# Patient Record
Sex: Female | Born: 1955 | Race: White | Hispanic: No | Smoking: Former smoker
Health system: Southern US, Community
[De-identification: ages and names within clinical notes are randomized; demographics above are authoritative.]

## PROBLEM LIST (undated history)

## (undated) DIAGNOSIS — F32A Depression, unspecified: Secondary | ICD-10-CM

## (undated) DIAGNOSIS — C519 Malignant neoplasm of vulva, unspecified: Secondary | ICD-10-CM

## (undated) DIAGNOSIS — F329 Major depressive disorder, single episode, unspecified: Secondary | ICD-10-CM

## (undated) DIAGNOSIS — M1990 Unspecified osteoarthritis, unspecified site: Secondary | ICD-10-CM

## (undated) DIAGNOSIS — E119 Type 2 diabetes mellitus without complications: Secondary | ICD-10-CM

## (undated) DIAGNOSIS — L0390 Cellulitis, unspecified: Secondary | ICD-10-CM

## (undated) DIAGNOSIS — I10 Essential (primary) hypertension: Secondary | ICD-10-CM

## (undated) DIAGNOSIS — F419 Anxiety disorder, unspecified: Secondary | ICD-10-CM

## (undated) DIAGNOSIS — E7800 Pure hypercholesterolemia, unspecified: Secondary | ICD-10-CM

## (undated) DIAGNOSIS — E1142 Type 2 diabetes mellitus with diabetic polyneuropathy: Secondary | ICD-10-CM

## (undated) DIAGNOSIS — K219 Gastro-esophageal reflux disease without esophagitis: Secondary | ICD-10-CM

## (undated) DIAGNOSIS — R519 Headache, unspecified: Secondary | ICD-10-CM

## (undated) DIAGNOSIS — J302 Other seasonal allergic rhinitis: Secondary | ICD-10-CM

## (undated) HISTORY — PX: CARPAL TUNNEL RELEASE: SHX101

## (undated) HISTORY — PX: TUBAL LIGATION: SHX77

## (undated) HISTORY — PX: WISDOM TOOTH EXTRACTION: SHX21

## (undated) HISTORY — PX: AMPUTATION: SHX166

---

## 2005-08-09 DIAGNOSIS — C519 Malignant neoplasm of vulva, unspecified: Secondary | ICD-10-CM

## 2005-08-09 HISTORY — DX: Malignant neoplasm of vulva, unspecified: C51.9

## 2005-08-09 HISTORY — PX: LYMPHADENECTOMY: SHX15

## 2005-08-09 HISTORY — PX: VULVA SURGERY: SHX837

## 2007-03-10 HISTORY — PX: INCISION AND DRAINAGE OF WOUND: SHX1803

## 2007-03-24 ENCOUNTER — Ambulatory Visit: Payer: Self-pay | Admitting: Surgery

## 2007-03-24 ENCOUNTER — Inpatient Hospital Stay (HOSPITAL_COMMUNITY): Admission: EM | Admit: 2007-03-24 | Discharge: 2007-04-06 | Payer: Self-pay | Admitting: *Deleted

## 2007-03-27 ENCOUNTER — Ambulatory Visit: Payer: Self-pay | Admitting: Dentistry

## 2007-03-27 ENCOUNTER — Encounter: Payer: Self-pay | Admitting: Cardiothoracic Surgery

## 2007-03-30 ENCOUNTER — Ambulatory Visit: Payer: Self-pay | Admitting: Dentistry

## 2007-04-01 ENCOUNTER — Ambulatory Visit: Payer: Self-pay | Admitting: Infectious Diseases

## 2007-04-25 ENCOUNTER — Ambulatory Visit: Payer: Self-pay | Admitting: Surgery

## 2007-05-02 ENCOUNTER — Ambulatory Visit: Payer: Self-pay | Admitting: Surgery

## 2007-05-02 ENCOUNTER — Encounter: Admission: RE | Admit: 2007-05-02 | Discharge: 2007-05-02 | Payer: Self-pay | Admitting: Surgery

## 2007-06-08 ENCOUNTER — Ambulatory Visit: Payer: Self-pay | Admitting: Surgery

## 2007-06-12 ENCOUNTER — Ambulatory Visit: Payer: Self-pay | Admitting: Thoracic Surgery (Cardiothoracic Vascular Surgery)

## 2007-06-12 ENCOUNTER — Encounter: Admission: RE | Admit: 2007-06-12 | Discharge: 2007-06-12 | Payer: Self-pay | Admitting: Surgery

## 2007-06-14 ENCOUNTER — Ambulatory Visit: Payer: Self-pay | Admitting: Surgery

## 2007-06-16 ENCOUNTER — Ambulatory Visit: Payer: Self-pay | Admitting: Surgery

## 2007-06-16 ENCOUNTER — Inpatient Hospital Stay (HOSPITAL_COMMUNITY): Admission: RE | Admit: 2007-06-16 | Discharge: 2007-06-21 | Payer: Self-pay | Admitting: Surgery

## 2007-07-04 ENCOUNTER — Ambulatory Visit: Payer: Self-pay | Admitting: Surgery

## 2007-07-14 ENCOUNTER — Encounter: Admission: RE | Admit: 2007-07-14 | Discharge: 2007-07-14 | Payer: Self-pay | Admitting: Cardiothoracic Surgery

## 2007-07-14 ENCOUNTER — Ambulatory Visit: Payer: Self-pay | Admitting: Cardiothoracic Surgery

## 2007-07-28 ENCOUNTER — Encounter: Admission: RE | Admit: 2007-07-28 | Discharge: 2007-07-28 | Payer: Self-pay | Admitting: Cardiothoracic Surgery

## 2007-07-28 ENCOUNTER — Ambulatory Visit: Payer: Self-pay | Admitting: Cardiothoracic Surgery

## 2007-08-15 ENCOUNTER — Ambulatory Visit: Payer: Self-pay | Admitting: Surgery

## 2007-08-18 ENCOUNTER — Encounter (HOSPITAL_BASED_OUTPATIENT_CLINIC_OR_DEPARTMENT_OTHER): Admission: RE | Admit: 2007-08-18 | Discharge: 2007-09-12 | Payer: Self-pay | Admitting: Surgery

## 2007-09-05 ENCOUNTER — Ambulatory Visit: Payer: Self-pay | Admitting: Surgery

## 2008-04-05 IMAGING — CT CT NECK W/ CM
3 of 4 series · 15 of 33 positions shown, 18 images · IV contrast (75CC OMNI 300)
Comparison: CT neck of 03/27/07.
COMPARISON: CT chest, 03/27/07.

CLINICAL DATA: Sternoclavicular joint infection, evaluate mediastinitis. 
 NECK CT WITH CONTRAST:
TECHNIQUE: Multidetector CT imaging of the neck was performed following the standard protocol during administration of intravenous contrast.
 Contrast:  75 cc of Omnipaque 300.
TECHNIQUE: Multidetector CT imaging of the chest was performed following the standard protocol during bolus administration of intravenous contrast.

[Series 3: chest and neck · axial · 0.79mm/px · z∈[-203,+142]mm · 7 of 123 slices shown, 9 images]
[im 16/123  soft-tissue]
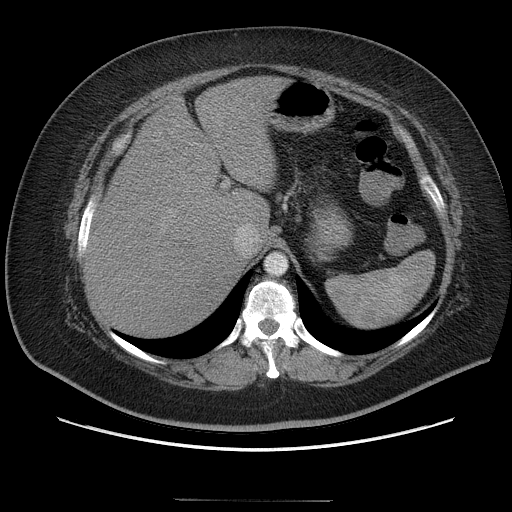
[im 16/123  bone]
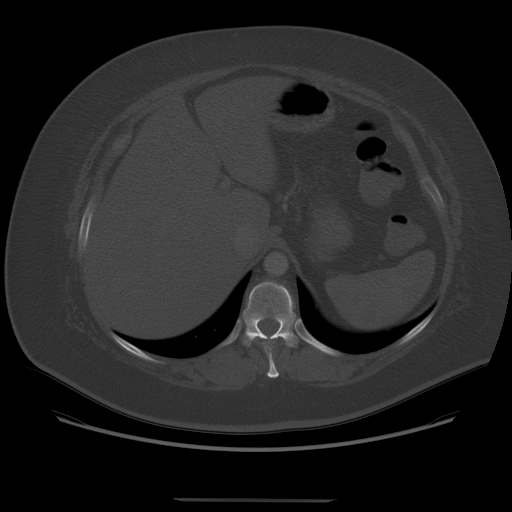
[im 31/123  bone]
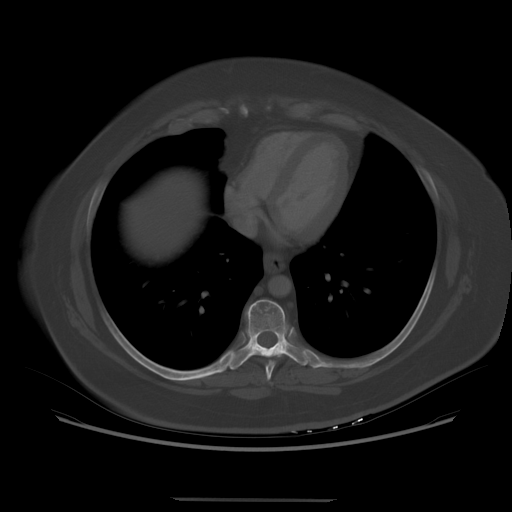
[im 46/123  bone]
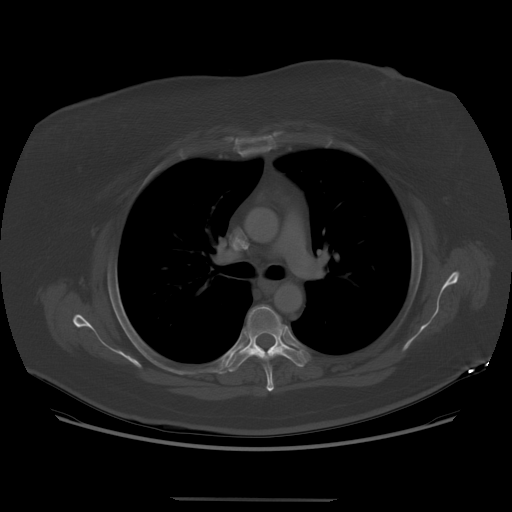
[im 62/123  bone]
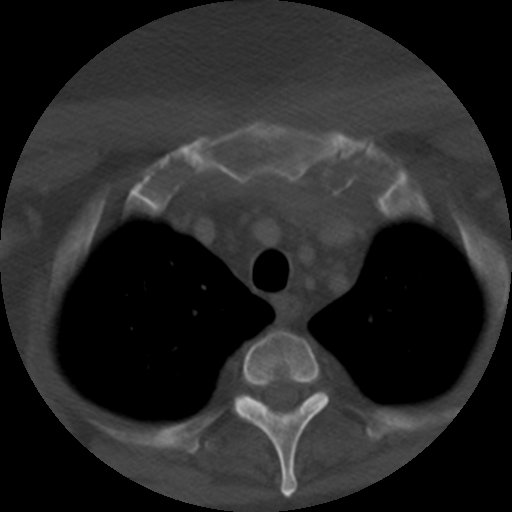
[im 77/123  soft-tissue]
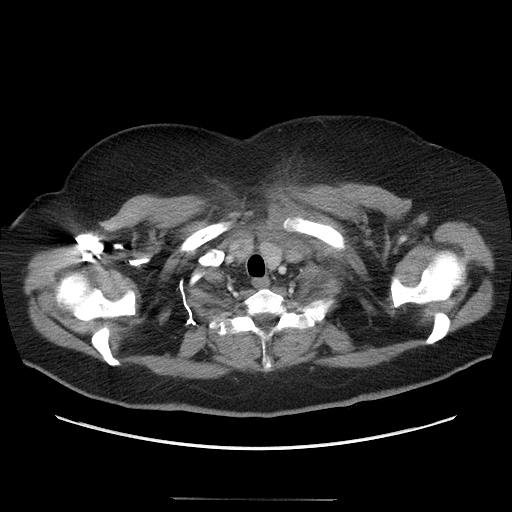
[im 77/123  bone]
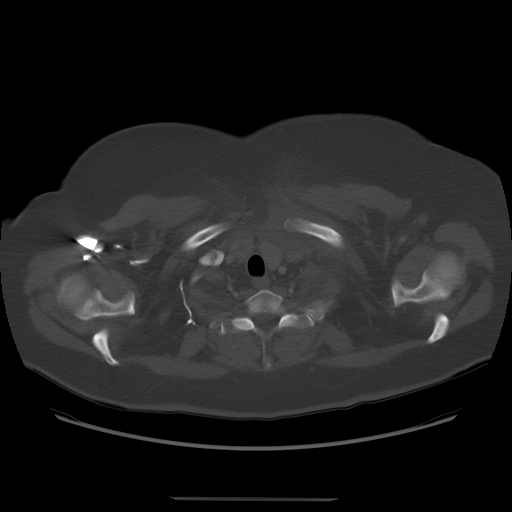
[im 92/123  bone]
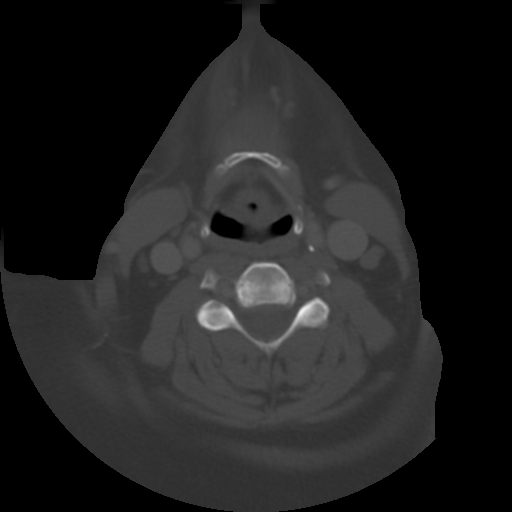
[im 107/123  bone]
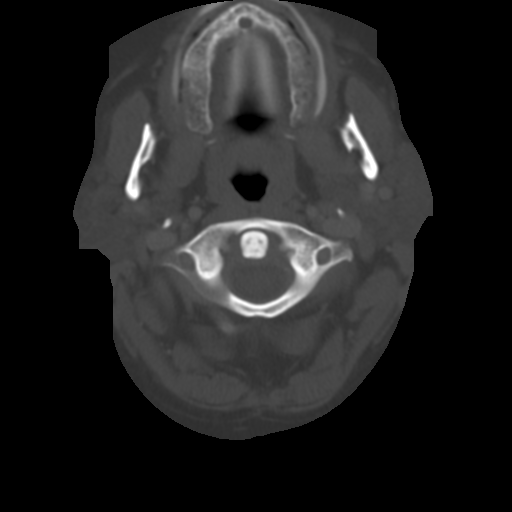

[Series 601: coronal body · coronal · 0.79mm/px · 5 of 136 slices shown, 6 images]
[im 46/136  bone]
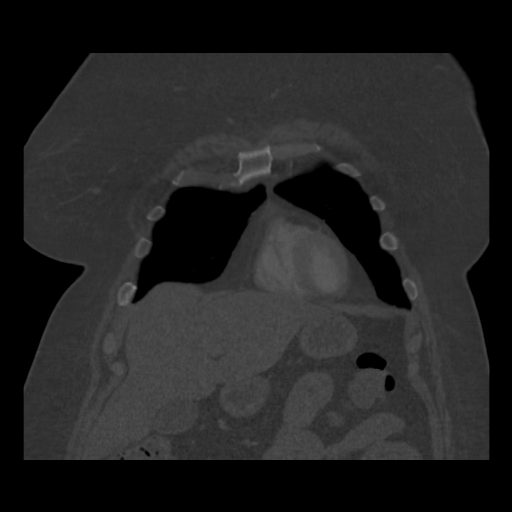
[im 57/136  bone]
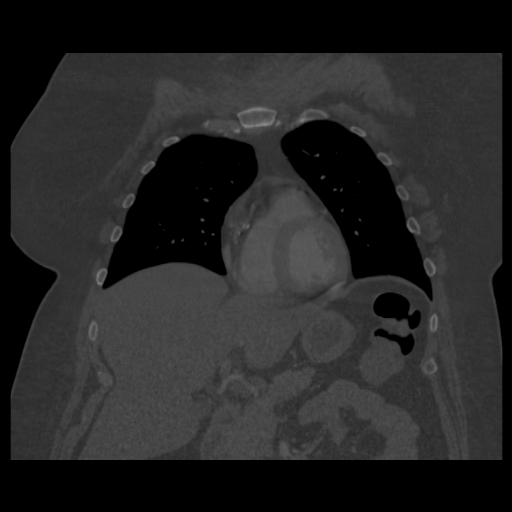
[im 68/136  soft-tissue]
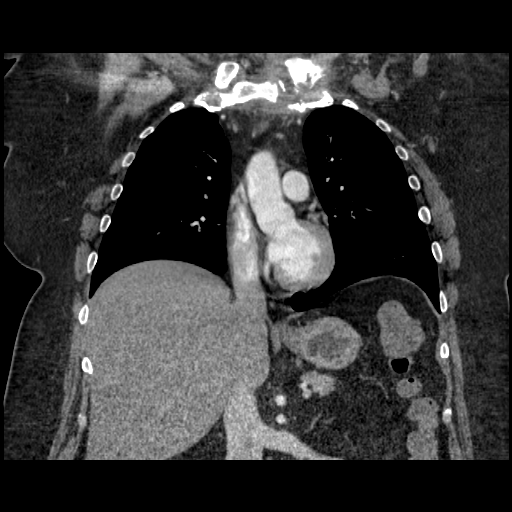
[im 68/136  bone]
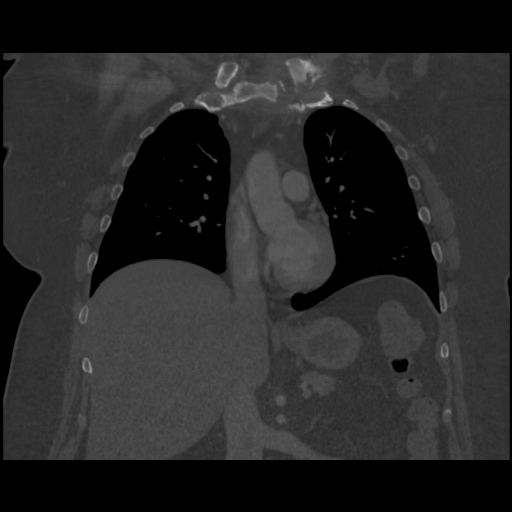
[im 79/136  bone]
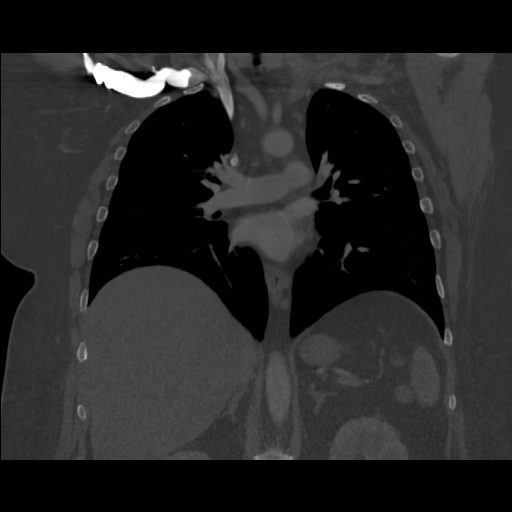
[im 91/136  bone]
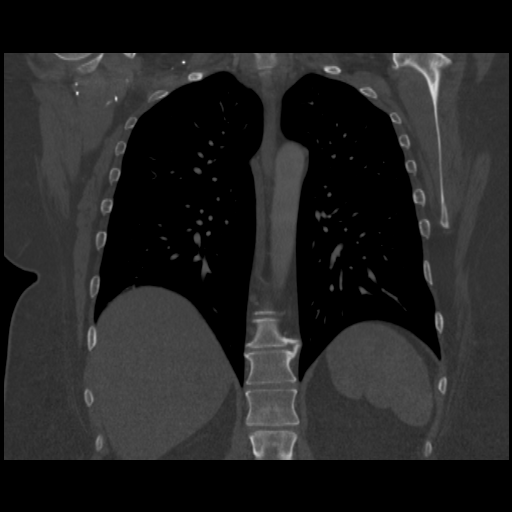

[Series 602: sagittal body · sagittal · 0.79mm/px · 3 of 162 slices shown]
[im 33/162  bone]
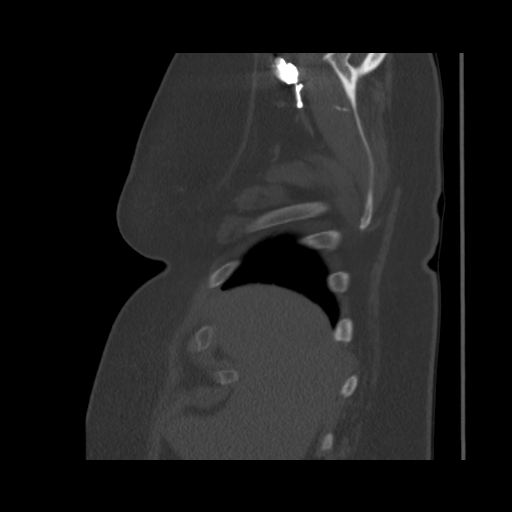
[im 65/162  bone]
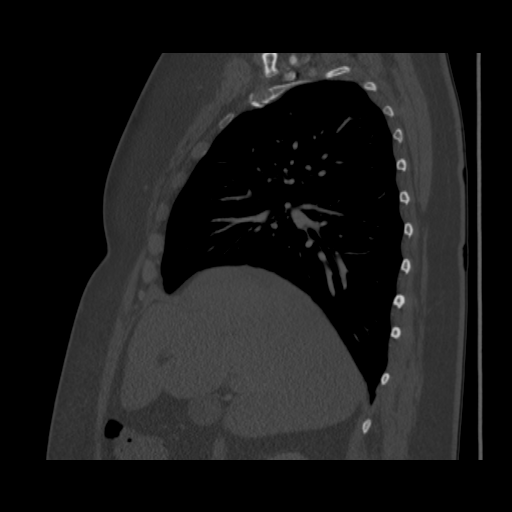
[im 97/162  bone]
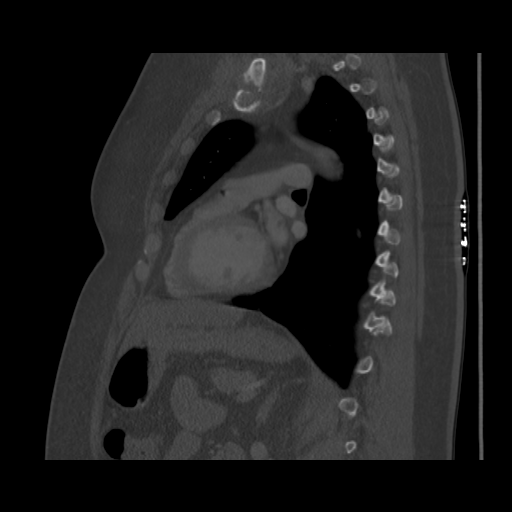

[15 of 33 positions shown; findings below may reference images not displayed]

FINDINGS: The base of the skull appears normal.  The paranasal sinuses are clear.   The parotid glands are symmetrical and normal.  The vallecula, preepiglottic fat space, piriform sinuses, level of the cords, and subglottic airway appear normal.  The edema/phlegmon involving the left sternocleidomastoid muscle has improved significantly.  There is soft tissue strandiness remaining on the anterolateral aspect of the left sternoclavicular muscle near the insertion on the head of the left clavicle which has improved.   There are cystic lesions within the distal aspect of the left clavicle and osteomyelitis is very difficult to exclude.  The previously noted mass effect caused by adjacent inflammatory reaction upon the left thyroid gland is no longer present.  The thyroid appears normal with only a single small low attenuation nodule in the right lobe inferiorly.  No abscess is seen and no adenopathy is noted.
IMPRESSION: 1.  Marked improvement in edema/phlegmon along the distal anterior left sternocleidomastoid muscle.
 2.  There is some irregularity of the mineralization of the left medial clavicle and osteomyelitis cannot be excluded.  No abscess is seen.
 CHEST CT WITH CONTRAST:
FINDINGS: There has been improvement in the edema/phlegmon adjacent to the left clavicular head and along the anterior aspect of the distal left sternocleidomastoid muscle as it inserts upon the sternum.  The strandiness within the anterior superior mediastinum has lessened as well.  No abscess is seen.  No mediastinal or hila adenopathy is noted.  On lung window images, no lung parenchymal abnormality is seen.
IMPRESSION: 1.  Improvement in edema/phlegmon surrounding the left medial clavicular head and adjacent left sternocleidomastoid muscle.  No abscess.
 2.  However, there are lucencies within the head of the left clavicle and osteomyelitis cannot be excluded.

## 2010-08-30 ENCOUNTER — Encounter: Payer: Self-pay | Admitting: Surgery

## 2010-12-22 NOTE — H&P (Signed)
HISTORY AND PHYSICAL EXAMINATION   June 14, 2007   Re:  Meghan [name], Meghan [name]        DOB:  1956/06/14   ADMITTING DIAGNOSIS:  Left supraclavicular joint infection with  osteomyelitis.   HISTORY OF PRESENT ILLNESS:  The patient is a 55 year old woman with a  history of diabetes who I initially saw on 03/24/2007 after she was  transferred to [facility] on the hospitalist service from [facility] for possible left sternoclavicular joint infection with  mediastinitis.  She had been treated at that time for recent  pyelonephritis with p.o. Cipro.  She continued to feel worse and  presented to [facility] with left neck pain.  She was  treated with an antiinflammatory and discharged.  She returned to her  primary physician, Dr. Sherril Croon, on March 23, 2007, and was noted to have  leukocytosis but no fever.  She had a CT scan of the neck that showed  possible left sternoclavicular joint infection with extension in the  mediastinum.  She was seen by an ENT physician at [facility] and  transfer to [facility] was recommended.  Review of her CT scan at that  time did show an inflammatory or infectious process around the left  sternoclavicular joint, as well as the left supraclavicular and left  lower neck area.  There appeared to be some extension below the clavicle  on the left side.  There was no definite abscess or defined fluid  collection.  This was more of a stranding or a thickening of the  subcutaneous plane and deep planes.  There was no evidence of bony  destruction at that time.Marland Kitchen  She was started on broad-spectrum  antibiotics including vancomycin.  Her symptoms began to improve.  Cultures of her blood were taken but there were no bacteria grown.  She  was seen for dental consultation due to multiple bad teeth and underwent  extraction of multiple teeth by oral surgery without complication.  She  underwent a CT scan a few days later on  August 18th which showed no  significant change in the inflammatory infectious process and no  evidence of osteomyelitis or abscess.  She was continued on intravenous  antibiotic with progressive improvement in the induration and erythema  around the left neck and upper chest.  She did sustain a left upper  extremity deep venous thrombosis of unclear etiology and was started on  heparin and then converted to Coumadin.  She was eventually discharged  home on oral antibiotics.  She underwent a repeat CT scan on 05/02/2007  that showed improvement in the edema and phlegmon adjacent to the left  clavicular head and along the anterior aspect of the distal left  sternocleidomastoid muscle.  The stranding in the anterior superior  mediastinum had lessened.  There was no evidence of abscess.  There was  new lucencies in the head of the left clavicle suggesting osteomyelitis.  Since discontinuing antibiotics, she has continued to have pain in her  left sternoclavicular joint area, as well as in the left shoulder.  She  has had pain whenever she uses her left arm and has difficulty lifting  her arm up above 90 degrees of abduction.  She has denied any fever or  chills.  I have seen her 3 times since her discharge.  When I last saw  her, she had some mild patchy erythema along the left lower neck and  around the left sternoclavicular joint area.  I thought it would be best  to repeat her CT scan since it had been about 1 month.  She had the scan  repeated on Monday and this shows continued inflammatory changes in the  left lower neck, as well as in the left sternoclavicular region.  This  does extend across the midline over to the area of the right  sternoclavicular joint.  The bony changes in the left clavicular head  are unchanged.  There is no evidence of bony destruction of the right  clavicle.  There are no abscesses or fluid collections.  She said that  since this past weekend she has noted  some pain over her right  sternoclavicular joint, as well as pain in her right shoulder and right  neck.  She has never had this before.  She has pain with movement of her  right arm.   REVIEW OF SYSTEMS:  GENERAL:  She denies any fever or chills.  She  denies recent weight changes.  She has had fatigue over the past couple  months.  EYES:  Negative.  ENT:  She denies any pain in her teeth or jaw at this time.  ENDOCRINE:  She has type 2 diabetes.  She denies hypothyroidism.  CARDIOVASCULAR:  She denies any chest pain or pressure.  She has had no  exertional dyspnea.  She has had no PND or orthopnea.  She denies  peripheral edema and palpitations.  RESPIRATORY:  She denies cough and sputum production.  GI:  She denies nausea and vomiting.  She has had some anorexia over the  past couple weeks.  She denies any melena or bright red blood per  rectum.  She denies dysphagia.  GU:  She denies dysuria and hematuria.  NEUROLOGICAL:  She denies any focal weakness or numbness.  She denies  dizziness and syncope.  She has never had a TIA or stroke.  ALLERGIES:  NONE.  PSYCHIATRIC:  Negative.  HEMATOLOGICAL:  Negative.   PAST MEDICAL HISTORY:  1. Significant for type 2 diabetes.  2. She has a history of hypertension and hypercholesterolemia.  3. She has a history of vulvar precancer, having had surgery done      twice.  4. 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   FAMILY HISTORY:  She was adopted and her family history is unknown.   SOCIAL HISTORY:  She works as a Pharmacologist.  She does not smoke  and denies alcohol abuse.   PHYSICAL EXAMINATION:  Temperature is 99.4.  Blood pressure 124/79.  Pulse 104 and regular.  Respiratory rate is 18, nonlabored.  She is a  well-developed, mildly obese, white female in no distress.  HEENT:  Exam shows to be normocephalic and atraumatic.  Pupils are equal  and reactive to light and accommodation.   Extraocular muscles are  intact.  Her throat is clear.  NECK:  Exam shows normal carotid pulses bilaterally.  There are no  bruits.  There is no thyromegaly.  There is no adenopathy.  There is  mild tenderness and erythema along the left lower neck, as well as in  the area over the left sternoclavicular joint and right sternoclavicular  joint.  There is no fluctuance or induration.  CARDIAC:  Exam shows a regular rate and rhythm and normal S1 and S2.  There is no murmur, rub, or gallop.  LUNGS:  Clear.  ABDOMEN:  Exam shows active bowel sounds.  Her abdomen was soft and  nontender.  There are no palpable masses or organomegaly.  EXTREMITIES:  Exam shows no peripheral edema.  Pedal pulses are palpable  bilaterally.  SKIN:  Warm and dry.  NEUROLOGIC:  Exam shows her to be alert and oriented x3.  Motor and  sensory exams are grossly normal.   IMPRESSION:  The patient has evidence of persistent inflammation and  infection involving the left sternoclavicular joint with some  destruction of the head of the left clavicle suggesting osteomyelitis.  She has developed progression of symptoms to the right side around the  right sternoclavicular joint with pain in the right shoulder and right  neck.  I think she has had an adequate course of intravenous followed by  oral antibiotics and the best treatment at this time is incision and  debridement of the left sternoclavicular joint.  This may require  debridement of the head of the left clavicle, as well as first costal  cartilage if involved.  I would plan to explore the right  sternoclavicular region if necessary.  I would plan to leave the wound  open and probably use a VAC suction system for dressing changes.  I  discussed the operative procedure with her and her husband including the  alternative of continued antibiotics which I do not recommend.  I  discussed the benefits and risks of surgery including but not limited to  bleeding, blood  transfusion, persistent infection, injury to major  vascular or nervous structures in the neck or chest, scarring, and need  for further procedures.  I also discussed removal of the head of the  clavicle on one or both sides, as well as costal cartilage and possibly  some of the sternum and the  possibility of some disability due to that.  She understands and would  like to proceed with surgery.  We will plan to do this on Friday,  06/16/2007.   Evelene Croon, M.D.  Electronically Signed   BB/MEDQ  D:  06/14/2007  T:  06/15/2007  Job:  [id number]   cc:   Evelene Croon, M.D.  Dhruv Sherril Croon

## 2010-12-22 NOTE — Assessment & Plan Note (Signed)
OFFICE VISIT   Meghan [name], Meghan [name]  DOB:  06/25/1956                                        July 28, 2007  CHART #:  [id number]   CURRENT PROBLEMS:  1. Status post debridment of left sternoclavicular joint infection for      osteomyelitis November 2008.  2. Diabetes mellitus.  3. Hypertension.   HISTORY OF PRESENT ILLNESS:  The patient returns for a wound check of  her left upper chest.  This is now secondarily granulating in and is  being treated with saline wet-to-dry.  It is very superficial and  involves really only the dermis at this point.  The chest wall is  nontender and stable, and her chest x-ray continues to show improvement.   Her more concerning problem now involves her right foot which has had  osteomyelitis in the past and for which she has required toe  amputations.  At this point, her right third toe has a necrotic end with  a sinus tract extending down towards the digital bone.  This was cleaned  with peroxide and saline and a small wick was applied.  She has been  started on oral Cipro by her primary care physician and an apparent MRI  had been performed which shows no osteomyelitis.   PHYSICAL EXAMINATION:  VITAL SIGNS: Blood pressure 100/80, pulse 90 and  regular, respirations 18, saturation 98% on room air and she is  afebrile.  LUNGS:  Her breath sounds are clear.  HEART:  Her cardiac rhythm is regular.  The chest wall incision over the  left sternoclavicular junction is granulating in and is almost  completely healed to the derma.  RIGHT TOE:  The right toe is concerning and it was cleaned and packed as  noted above.   PLAN:  The patient will continue local wound care for her chest wall  incision.  I recommended that she obtain evaluation and possible therapy  for her foot problem at the wound center here in [place] and our  office will make the referral.  She will continue the antibiotics as  prescribed by her  primary doctor, Dr. Reggie Pile, and return here to see Dr.  Laneta Simmers after the first of the year.  No prescription were provided this  office visit.   Kerin Perna, M.D.  Electronically Signed   PV/MEDQ  D:  07/28/2007  T:  07/30/2007  Job:  [id number]

## 2010-12-22 NOTE — H&P (Signed)
NAME:  Meghan [name], Meghan [name] NO.:  [id number]   MEDICAL RECORD NO.:  [id number]          PATIENT TYPE:  INP   LOCATION:  2312                         FACILITY:  [facility]   PHYSICIAN:  Hettie Holstein, D.O.    DATE OF BIRTH:  15-Apr-1956   DATE OF ADMISSION:  03/24/2007  DATE OF DISCHARGE:                              HISTORY & PHYSICAL   PRIMARY CARE PHYSICIAN:  Doreen Beam, MD, in [place].   CHIEF COMPLAINT:  Fevers and neck pain.   HISTORY OF PRESENT ILLNESS:  Meghan [name] is a very pleasant 55-year-  old female who was contacted by Dr. Sherril Croon in request of transfer to [facility] for the radiographic diagnosis of mediastinitis and pyelonephritis.  Meghan [name] presented to the emergency department with pain in her  neck on Friday and Saturday this past week, predominantly involving her  left.  This was followed by nausea and vomiting with flank pain on  Sunday.  She was seen by Dr. Sherril Croon on Monday and ultimately seen in the  emergency room on Tuesday after being initiated on antibiotics for  urinary tract infection with Cipro.  She was given some IV fluids, had  some blood work drawn, urinalysis and sent home with Voltaren Wednesday.  This pain got worse, to the point she stated she was unable to even  dress.  She had high fevers of 102.6.  Ultimately was admitted by Dr.  Sherril Croon on Thursday, underwent ENT consultation, and CT scanning of her  neck revealed the findings concerning for mediastinitis.   I discussed this case with Dr. Laneta Simmers prior to her arrival here, and  ultimately, I discussed the CT imaging with Dr. Jena Gauss of radiology on  call over the evening.  He, in addition, was concerned that this was  anterior mediastinitis, perhaps resulting from a septic [place] joint or it  could possibly be the result of seeding from her pyelonephritis.  No  culture data is available at this time.  She is sent on Unasyn.  We are  initiating Zosyn and vancomycin after obtaining a  set of blood cultures.  At this juncture, Dr. Laneta Simmers is going to see her soon and perhaps  perform drainage.  No apparent abscesses at this time, but there is  certainly a possible development.   MEDICAL HISTORY:  Significant for vulvar surgery in 2007, which she  states was precancerous lesions.  This was followed by lymph node  dissection in her groin in 2008, where she reports these were negative.  She has had a toe amputation in June, as she has insulin-requiring  diabetes.  She has undergone laparoscopic bilateral tubal ligation.  She  has history of hypertension and hypercholesterolemia.   MEDICATIONS:  The medications which she takes regularly include:  1. Lantus 73 units q.a.m.  2. Metformin 1000 mg b.i.d.  3. Folic acid 1 mg daily.  4. Enalapril 5 mg p.o. b.i.d.  5. Zocor 40 mg daily.   ALLERGIES:  SHE HAS NO KNOWN DRUG ALLERGIES.   SOCIAL HISTORY:  She is a Associate Professor at [employer].  She does not drink  alcohol and does  not smoke.  She is married, and she does have 1 child.   FAMILY HISTORY:  She states that she does not know her father and is  unaware of her mother's family history either.  It is noted that in Dr.  Sherril Croon' H&P that she is adopted.   REVIEW OF SYSTEMS:  Nausea and vomiting as described above on Sunday.  No bloody emesis, no hematochezia, no dysuria.  She does have flank pain  as described above and high fevers.   PHYSICAL EXAMINATION:  VITAL SIGNS:  Stable.  She was slightly  tachycardic.  Her temperature upon arrival was 98.4, heart rate 113,  blood pressure 141/53, respirations 14, oxygen is 100% on 2 liters.  HEENT:  Her head is normocephalic, atraumatic.  Extraocular muscles are  intact.  Her oropharynx reveals oral thrush.  NECK:  Swollen and tender, more specifically left greater than right.  Her left sternoclavicular joint is erythematous and warm and tender to  touch.  She has exquisite pain with movement of left arm proximally.   CARDIOVASCULAR EXAM:  There is normal S1 and S2, though tachycardic.  LUNGS:  She exhibits normal effort, and breath sounds are clear  bilaterally.  ABDOMEN:  Soft, mildly obese, nontender.  No rebound or guarding.  LOWER EXTREMITIES:  Reveal no edema.  Peripheral pulses symmetrical and  palpable.  NEUROLOGIC EXAM:  She is alert and oriented.  Affect is stable.   LABORATORY DATA:  Obtained from [facility], her white count was  21.5, hemoglobin 11.8, platelet count 348, her sed rate was 80, INR was  1.1.  Sodium 134, potassium 4.1, BUN 7, creatinine 0.6, glucose 271, an  albumin of 2.4.  Her urinalysis revealed a few uric acid crystals.   ASSESSMENT:  1. Mediastinitis.  2. Possible septic sternoclavicular joint on the left.  3. Pyonephritis.  We are currently awaiting culture data.  4. Febrile illness and leukocytosis.  5. Diabetes mellitus.  6. Thrush.  7. Hypertension.  8. History of vulvar cancer.   PLAN:  At this time, Meghan [name] is being held n.p.o. until she is  postop per Dr. Laneta Simmers, who will see her in the morning.  We will  continue empiric antibiotics and obtain blood cultures.  We will follow  her clinical course, decrease her Lantus, and place her on a D5 drip  while she is n.p.o. and await further recommendations from Dr. Laneta Simmers.      Hettie Holstein, D.O.  Electronically Signed     ESS/MEDQ  D:  03/24/2007  T:  03/24/2007  Job:  [id number]

## 2010-12-22 NOTE — Assessment & Plan Note (Signed)
OFFICE VISIT   Meghan [name], Meghan [name]  DOB:  06-21-1956                                        July 04, 2007  CHART #:  [id number]   HISTORY OF PRESENT ILLNESS:  Meghan [name] returned to the office today  for followup status post incision and drainage and debridement of her  left sternoclavicular joint on June 16, 2007. She had insertion of a  vacuum sponge drain and was discharged home with home health nursing  changing the sponge. She did not have any pus in the joint but the joint  was essentially destroyed. The cultures did not grow any bacteria. She  was treated with a course of Augmentin for 2 weeks postoperatively. She  said she has been feeling relatively well overall. She denies any fever  or chills. She has had no pain in her left shoulder or upper extremity.  She has had some intermittent pains in her right shoulder but said this  has been better than it was before. She has had some anorexia, which she  relates being due to the Augmentin.   PHYSICAL EXAMINATION:  VITAL SIGNS:  On examination today blood pressure  is 111/68, pulse 84 and regular. Respiratory rate is 18 and unlabored.  Oxygen saturation on room air is 96%.  GENERAL:  She looks well.  CARDIOVASCULAR:  The vac sponge was removed and the wound is closing in  nicely. It is fairly superficial and granulating well. There is no  erythema or induration. There is no swelling or induration over the  right sternoclavicular joint.   IMPRESSION:  Meghan [name] appears to be making a good recovery  following incision and debridement of her left sternoclavicular joint  for suspected septic arthritis. This appears to have resolved her  symptoms on that side. The wound is healing in nicely and we will switch  her to wet to dry dressings until completely healed. The etiology of  this is still unclear, since no bacteria grew from that. She will  complete her current antibiotic course  and then we will continue  following her to see if she develops any recurrent signs of inflammation  or infection, after discontinuation of the antibiotics. I am still a  little bit concerned about her right sternoclavicular joint but we will  need to follow this to see what the natural progression of it is. I  will plan to have her return to the office in about 2 weeks, to see Dr.  Donata Clay for followup and I will see her when I return to [facility] in  January.   Evelene Croon, M.D.  Electronically Signed   BB/MEDQ  D:  07/04/2007  T:  07/04/2007  Job:  [id number]

## 2010-12-22 NOTE — Discharge Summary (Signed)
NAME:  Meghan [name], Meghan [name] NO.:  [id number]   MEDICAL RECORD NO.:  [id number]          PATIENT TYPE:  INP   LOCATION:  6730                         FACILITY:  [facility]   PHYSICIAN:  Herbie Saxon, MDDATE OF BIRTH:  [birth year]   DATE OF ADMISSION:  03/24/2007  DATE OF DISCHARGE:  04/06/2007                         DISCHARGE SUMMARY - REFERRING   ADDENDUM:   ADDITIONAL DISCHARGE DIAGNOSIS:  Chronic periodontitis, retained roots.   PROCEDURE:  Alveoloplasty and multiple tooth extraction on March 30, 2007.   CONSULTATIONS:  1. Charlynne Pander, D.D.S., dental.  2. Lacretia Leigh. Ninetta Lights, M.D., infectious disease.   A left venous Doppler confirmed DVT in the left brachial vein.   CLINICAL CONDITION:  stable.  Patient is tolerating oral food.  She has  intermittent bleeding from the gums but this is inactive at present.   DIET:  Cardiac, 1800 calorie, low cholesterol.   ACTIVITY:  As tolerated.   FOLLOW UP:  1. Primary care physician, Dr. Jearld Fenton, in five to seven days.  2. Dr. Laneta Simmers in three to four weeks.  3. Dr. Kristin Bruins, dentist, in two to three weeks.   DISCHARGE MEDICATIONS:  1. Augmentin 875 mg p.o. b.i.d. for four weeks.  2. Doxycycline 100 mg b.i.d. for four weeks.  3. Chlorhexidine 10 mL t.i.d.  4. Lantus insulin 65 units subcu nightly.  5. Lisinopril 10 mg daily.  6. Metoprolol 12.5 mg b.i.d.  7. Multivitamin one tablet daily.  8. Metformin 500 mg p.o. daily.  9. Coumadin 3 mg nightly.  Primary care is to monitor PT/INR at least      twice weekly the first two weeks, then subsequently weekly.      Patient has been educated to watch for bleeding.   PHYSICAL EXAMINATION:  VITAL SIGNS:  Temperature 98, pulse 72,  respiratory rate 18, blood pressure 110/65.  GENERAL APPEARANCE:  She is a middle-age lady in no acute distress.  HEENT:  Pupils are equal, round, reactive to light and accommodation.  Oropharynx  clear, no active gum bleeding.  NECK:  Supple.  CHEST:  Clinically clear.  CARDIOVASCULAR:  Heart sounds 1 and 2, regular rate and rhythm.  ABDOMEN:  Benign.  EXTREMITIES:  Peripheral pulses present, no pedal edema.  Left arm  swelling is much reduced.  There is no erythema.  NEUROLOGIC:  She is alert and oriented x3.   LABORATORY DATA:  The wbc is 9.6, hematocrit 32, platelet count 635.  INR 2.9, PT 31.4.  Chemistry shows sodium 138, potassium 3.8, chloride  105, bicarbonate 27, glucose 138.  BUN 10, creatinine 0.55.      Herbie Saxon, MD  Electronically Signed     MIO/MEDQ  D:  04/06/2007  T:  04/06/2007  Job:  [id number]

## 2010-12-22 NOTE — Consult Note (Signed)
NAME:  HOLY, BATTENFIELD NO.:  [id number]   MEDICAL RECORD NO.:  [id number]          PATIENT TYPE:  INP   LOCATION:  2312                         FACILITY:  [facility]   PHYSICIAN:  Evelene Croon, M.D.     DATE OF BIRTH:  February 14, 1956   DATE OF CONSULTATION:  DATE OF DISCHARGE:                                 CONSULTATION   REFERRING PHYSICIAN:  Dr. Angelena Sole.   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   HISTORY OF PRESENT ILLNESS:  I was asked by Dr. Angelena Sole to evaluate this  patient for possible sternoclavicular joint infection with  mediastinitis, who was transferred here from [facility]  overnight.  She was a 55 year old obese diabetic woman, who was treated  for possible pyelonephritis with p.o. Cipro recently.  She continued to  feel worse and presented to [facility] emergency room with neck  pain.  She was treated with diclofenac and discharged.  The patient said  that she could not take that medicine and was seen in the office of Dr.  Sherril Croon on March 23, 2007.  She was noted to have leukocytosis.  She was  also noted be febrile.  She had a CT scan of the neck, which was felt by  radiology and showed possible infection of the sternoclavicular joint  area, with extension into the mediastinum suggesting mediastinitis.  She  was apparently seen by an ear, nose and throat physician at [facility],  and it was felt that transfer to a tertiary care center was the best  option for treatment.  She was, therefore, transferred to [facility]  overnight.   REVIEW OF SYSTEMS:  Is as follows:  GENERAL:  She reports having fever  and chills.  She has had no recent weight changes.  She has had fatigue.  EYES:  Negative.  ENT:  She denies any pain in her teeth or jaw.  ENDOCRINE:  She has type 2 diabetes.  She denies hypothyroidism.  CARDIOVASCULAR:  She denies any chest pain or pressure.  She has had no  exertional  dyspnea.  She denies PND or orthopnea.  Had had no peripheral  edema or palpitations.  RESPIRATORY:  She denies cough and sputum  production.  GI:  She denies nausea and vomiting.  She does have some  mild discomfort with swallowing.  She denies any melena or bright red  blood per rectum.  GU:  She denies dysuria and hematuria.  She has had  some mild flank pain.  NEUROLOGICAL:  She denies any focal weakness or  numbness.  She has a lot of pain in her left arm.   ALLERGIES:  NONE.   PSYCHIATRIC:  Negative.   HEMATOLOGICAL:  Negative.   PAST MEDICAL HISTORY:  Significant for type 2 diabetes.  She has a  history of hypertension and hypercholesterolemia.  She has a history of  vulvar pre-cancer, having had surgery done twice.  She has history of a  right second toe amputation, secondary to diabetic complication.   SOCIAL HISTORY:  She works as a Pharmacologist.  She does not smoke  and denies  alcohol abuse.   FAMILY HISTORY:  Is unknown.  The patient was adopted.   PHYSICAL EXAMINATION:  VITAL SIGNS:  She is afebrile.  Blood pressure  was 145/70, and her pulse is 110 and regular.  Respiratory rate is 18  and unlabored.  GENERAL:  She is an obese, white female in no distress.  HEENT:  Exam shows to be normocephalic and atraumatic.  Pupils are  equal, reactive to light accommodation.  Extraocular muscles are intact.  Her throat is clear.  NECK:  Exam shows normal carotid pulses bilaterally.  There are bruits.  There is no thyromegaly.  The left supraclavicular area and left neck  area is mildly swollen and somewhat tender and erythematous and  indurated.  SKIN:  Intact.  CARDIAC:  Exam shows a regular rate and rhythm with normal S1 and S2.  There is no murmur, rub or gallop.  LUNGS:  Clear.  ABDOMEN:  Exam shows active bowel sounds.  Abdomen soft, obese and  nontender.  There are no palpable masses or organomegaly.  EXTREMITY:  Exam shows no peripheral edema.  NEUROLOGIC:   Exam shows to be alert and oriented x3.  Motor and sensory  exams grossly normal.   LABORATORY:  Her white blood cell count of at [facility] was  21.6, with hemoglobin 11.8, platelet count 340,000 with a left shift.  Sed rate was elevated at 80.  Her coagulation profile is within normal  limits.  Urinalysis showed dysuria, 0 to 5 white cells with negative  leukocyte esterase and negative nitrates.  Her albumin was 2.4 with  normal liver function profile and normal electrolytes.  BUN was 7,  creatinine was 0.6.  Laboratory exam here showed a white blood cell  count of 22,000.   IMPRESSION:  I reviewed the neck CT from [facility] with  radiology and Dr. Tyrone Sage and Dr. Edwyna Shell.  The patient has an  inflammatory or infectious process in the left supraclavicular and left  lower neck area.  There appears to be some extension of this below the  left clavicle, around the left sternoclavicular joint, and possibly into  the upper mediastinum.  There is no abscess or defined fluid collection.  This was more of a stranding and thickening in the subcutaneous plane  and deep planes.  There is no evidence of bony destruction or definite  infection in the left sternoclavicular joint.  The CT scan only goes  down into the upper mediastinum, at the level of the sterno-manubrial  junction.  The etiology of this is unclear.  The patient certainly could  have developed some bacteremia and seated her sternoclavicular joint,  causing infection and surrounding inflammation.  This could also  represent a non-infectious cause, such as gallops.  It is also possible  there could be an unusual tumor in this area, causing these findings.  At this point in time, I do not think there is any definite fluid  collection or abscess to drain.  If we try to drain this area now, we  will likely end up with a large open wound, with some murky-appearing  subcutaneous fluid.  My colleagues do not feel that the  best initial  treatment would be broad-spectrum intravenous antibiotics. including  cover for MRSA and a close followup, to see the patient improves.  She  should have a follow-up CT scan in 4 to 5 days, to see if there has been  progressive improvement with antibiotics.  If this does not resolve  or  she develops a well-defined abscess, then that would require a surgical  drainage.  I discussed this plan with her and her family, and they are  in agreement to proceed in this direction.      Evelene Croon, M.D.  Electronically Signed     BB/MEDQ  D:  03/24/2007  T:  03/25/2007  Job:  [id number]

## 2010-12-22 NOTE — Consult Note (Signed)
NAME:  Meghan [name] NO.:  [id number]   MEDICAL RECORD NO.:  [id number]          PATIENT TYPE:  INP   LOCATION:  6730                         FACILITY:  [facility]   PHYSICIAN:  Charlynne Pander, D.D.S.DATE OF BIRTH:  1956/01/02   DATE OF CONSULTATION:  03/27/2007  DATE OF DISCHARGE:                                 CONSULTATION   Meghan [name] is a 55 year old female referred by Dr. Kathlee Nations Trigt for a dental consultation.  Patient with recent mediastinitis  and left sternoclavicular joint infection. Dr. Donata Clay ordered a  dental consultation to rule out dental infection that may be a possible  etiology for the infection.   MEDICAL HISTORY:  1. Mediastinitis and left sternoclavicualr joint infection currently      on IV Zosyn and vancomycin therapy.  2. Diabetes mellitus - type 2.  3. Hypertension.  4. Hypercholesterolemia.  5. History of vulvar pre-cancer with surgeries x2.  6. 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   ALLERGIES:  None known.   MEDICATIONS:  1. Vancomycin 1 gram IV every 8 hours per protocol.  2. Zosyn 3.375 grams IV every 6 hours.  3. Diflucan 100 mg IV every 24 hours.  4. Senokot daily.  5. Lisinopril 5 mg daily.  6. Multivitamin daily.  7. Potassium chloride 20 mEq as indicated.  8. Lantus insulin 30 units at bedtime.  9. Novolin insulin per sliding scale.  10.Dilaudid 0.5 mg IV every 3 hours as needed.   SOCIAL HISTORY:  The patient is married with one child.  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 The patient is a nonsmoker and  nondrinker.   FAMILY HISTORY:  The patient was adopted and does not know the medical  history of her parents.   FUNCTIONAL ASSESSMENT:  The patient remains independent with her ADLs at  this time.   REVIEW OF SYSTEMS:  This was reviewed from the chart and health history  assessment form for this admission.   DENTAL HISTORY:   CHIEF COMPLAINT:   Dental consultation requested to rule out dental  infection which may be possible etiology for the left sternocalvicular  joint infection as well as mediastinitis.   HISTORY OF PRESENT ILLNESS:  The patient recently admitted with  infection involving left sternoclavicular joint area as well as the  mediastinum. A dental consultation was requested to rule out dental  etiology for this infection.   The patient currently denies acute toothache, swellings or abscesses.  The patient indicates that she does have some tenderness to the lower  teeth from the lower partial denture.  The patient indicates that she  has an upper complete denture and lower acrylic partial denture which  fits good.  The patient indicates that both of these were made  approximately 8 years ago.   DENTAL EXAM:  GENERAL:  The patient is a well-developed, well-nourished  female in no acute distress.  VITAL SIGNS:  Blood pressure 161/84, pulse rate 111, temperature is  98.8, respirations are 18.  HEAD/NECK:  There is no palpable submandibular lymphadenopathy.  The  patient denies acute  TMJ symptoms.  INTRAORAL EXAM:  Patient with normal saliva.  There is no evidence of  abscess formation within the mouth at this time..  DENTITION:  The  patient is missing all maxillary teeth and multiple lower teeth  including tooth numbers 17, 18, 19, 23, 24, 25, 26, 31 and 32.  Tooth  #21 is present as a retained root segment.  PERIODONTAL:  Patient with chronic advanced periodontal disease with  plaque and calculus accumulations, generalized gingival recession, and  generalized tooth mobility.  ENDODONTIC:  The patient currently denies acute pulpitis symptoms but  does have a history of gum tenderness.  There is some increased  periodontal ligament space associated with multiple teeth.  DENTAL CARIES:  Dental caries are noted.  CROWN/BRIDGES:  There are no crown or bridge restorations noted.  PROSTHODONTIC:  Patient with an upper  complete denture which is held in  by denture cases.  Patient with an ill-fitting mandibular acrylic  partial denture.  OCCLUSION:  Patient with a poor occlusal scheme secondary to multiple  missing teeth and lack of replacing the missing teeth with clinically  acceptable dental restorations.  OCCLUSION:  Patient with a poor occlusal scheme.   RADIOGRAPHIC INTERPRETATION:  Panoramic x-ray was taken on March 27, 2007.   There are multiple missing teeth.  There is incipient increased  periodontal ligament space associated with tooth numbers 20, 21, 27, 28,  and 29. There is no obvious overt abscess or periapical pathology noted.  There is severe bone loss noted.   ASSESSMENT:  1. History of oral neglect.  2. Chronic periodontitis with bone loss associated with generalized      gingival recession.  3. Generalized tooth mobility.  4. Generalized plaque and calculus accumulations.  5. Multiple missing teeth.  6. Retained root segment in the area of #21.  7. Poor occlusal scheme.  8. Ill-fitting maxillary complete and mandibular acrylic partial      denture.  9. Current mediastinum infection with a possible dental etiology.      Although there is no acute toothache symptoms at this time.   PLAN/RECOMMENDATIONS:  I will discuss the potential treatment options  with Dr. Donata Clay and the patient and proceed as indicated. In the  future, the patient most likely will need extraction of the remaining  teeth with  alveoloplasty and preprosthetic surgery as indicated along with  subsequent fabrication of upper and lower complete denture after  adequate healing.  Will discuss timing of these dental extractions in  relation to the current infection and IV antibiotic therapy and then  schedule appropriately.      Charlynne Pander, D.D.S.  Electronically Signed     RFK/MEDQ  D:  03/27/2007  T:  03/28/2007  Job:  [id number]   cc:   Kerin Perna, M.D.

## 2010-12-22 NOTE — Assessment & Plan Note (Signed)
OFFICE VISIT   Meghan [name], Meghan [name]  DOB:  June 22, 1956                                        June 08, 2007  CHART #:  [id number]   The patient returned today for followup of possible left  sternoclavicular joint infection and mediastinitis.  She has completed  her course of antibiotics.  She has had no fever or chills but continues  to report pain over the left sternoclavicular joint, as well as pain  with movement of her left upper extremity.  She has noted a clicking  sensation in her left shoulder.  She has also noticed some erythema  along the left neck which has moved down over the sternoclavicular joint  itself.   PHYSICAL EXAMINATION:  Her blood pressure is 138/82 and her pulse is 93  and regular.  Respiratory rate is 18, nonlabored.  Oxygen saturation on  room air was 98%.  She looks well.  There is continued firmness over the  left sternoclavicular joint, as well as the lower neck area.  There is  mild patchy erythema over the area of the sternoclavicular joint.  There  is minimal tenderness.  She does report tenderness on moving her left  arm and is reluctant to move it up above 90 degrees of abduction.   IMPRESSION:  I suspect that the patient continues to have some ongoing  infection or inflammation in the left sternoclavicular joint.  Her last  CT scan suggested progressive lucency of the head of the left clavicle  suggesting possible osteomyelitis.  There was decreased stranding of the  mediastinal and subcutaneous tissues suggesting decreased inflammation  but this may have been due to her being on antibiotics.  She is showing  no clinical signs of infection but just stopped her antibiotics in the  last couple days.  We will plan to repeat her CT scan of the neck and  chest on Monday of next week, November 3rd, and I will plan to see her  back after that scan on the 3rd to review the results with her.  If she  is showing signs of  progressive destruction of the left sternoclavicular  joint and clavicle or progression of inflammatory signs in the general  area or worsening external signs of infection, then I would plan to  proceed with surgical drainage and  debridement.  I think this would be a fairly morbid operation for her  that would take a long time to recover from.   Evelene Croon, M.D.  Electronically Signed   BB/MEDQ  D:  06/08/2007  T:  06/09/2007  Job:  [id number]

## 2010-12-22 NOTE — Assessment & Plan Note (Signed)
[facility]   NAME:  CHANTEL, Meghan [name] NO.:  [id number]   MEDICAL RECORD NO.:  [id number]      DATE OF BIRTH:  June 11, 1956   PHYSICIAN:  Theresia Majors. Tanda Rockers, M.D. VISIT DATE:  08/21/2007                                   OFFICE VISIT   SUBJECTIVE:  Meghan [name] is a 55 year old lady who is referred by Dr.  Doreen Beam from [place].  She is referred for evaluation of  a diabetic foot ulcer involving the right third toe.   IMPRESSION:  Wagner grade II diabetic foot ulcer (near resolved).   SUBJECTIVE:  Meghan [name] is a 55 year old diabetic who developed an  ulcerative lesion on the tip of the third toe of the right foot several  weeks ago.  She was treated with p.o. antibiotics and has since  experienced a decrease in drainage and redness.  She continues to be  ambulatory.  She denies fever.   PAST MEDICAL HISTORY:  Is remarkable for no known allergies.   CURRENT MEDICATION:  List includes:  1. Lantus 70 units q.h.s.  2. Tylox 1-2 every 4 hours.  3. Metformin 1000 mg b.i.d..  4. Enalapril 5 mg b.i.d..  5. Folic acid 1 mg daily.  6. Simvastatin 40 mg q.h.s..  7. Lopressor 50 mg daily.  8. Pantoprazole sodium 40 mg b.i.d..  9. Coumadin 3 mg alternating daily.  10.Citalopram 20 mg daily.  11.Metoclopramide 5 mg t.i.d. with meals.   PREVIOUS SURGERY:  1. Has included bilateral vulvar excisions with bilateral inguinal      adenectomies.  2. She is had a right toe amputation.  3. She has had multiple scans of the lower extremity with serial      debridement's of the toe.  4. She has also had an excision of the left clavicle for      osteomyelitis.  5. She has had extensive serologic and immunologic studies which have      been nondiagnostic.   FAMILY HISTORY:  Is positive for hypertension, diabetes and vascular  disease.   SOCIALLY:  She is married.  She has 1 child who is in college.  She  lives with her husband.  She  has lost 15-20 pounds over the last year  due to her frequent infectious illnesses.  She has been seen by  Infectious Disease at multiple institutions.  She has not smoked in over  20 years and denies chronic cough.   REVIEW OF SYSTEMS:  She denies transient paralysis or stigmata of TIAs.  She specifically denies chest pain.  Her bowel and bladder function are  unremarkable.  She denies migratory myalgias.  Her exercise tolerance  allows her to walk no more than a block.  She is able to negotiate 1  flight of stairs.  The remainder review of systems is negative.   PHYSICAL EXAMINATION:  GENERAL;:  She is alert, oriented in no acute  distress.  She is accompanied by her husband.  VITAL SIGNS:  Blood pressure is 132/74, respirations 16, pulse rate 79,  temperature 98, capillary blood glucose is 150 mg percent.  HEENT:  Exam is clear.  NECK:  Is supple.  Trachea is midline.  Thyroid is nonpalpable.  LUNGS:  Were clear.  There is a well-healed incision  over the head of  the left clavicle in the junction at the manubrium.  HEART:  Sounds were distant.  ABDOMEN:  Is soft.  EXTREMITIES:  Are remarkable for bilateral 2+ edema with  hyperpigmentation consistent with a post inguinal adenectomy status.  The pedal pulses are 3+ and readily palpable.  Sensation is preserved as  judged by the Semmes-Weinstein Filament.  On the third toe of the right  foot is a punctate well-adherent callus which was debrided free with a  10 blade.  This maneuver disclosed a completely re-epithelialization  wound underneath with no evidence of drainage or infection.  The  surgical absence of the second toe is obvious on the right and the fifth  toe has been amputated on the left.  There are hyperostosis and calluses  on the volar aspects of both feet suggesting improper weight  distribution.   DISCUSSION:  Meghan [name] has no active wound at this point.  The  physical exam of her lower extremities is consistent  with her previous  surgical history of bilateral adenectomies.  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  She has an extension reflex involving the  third toe of the right foot which undoubtedly contributes to a recurrent  abrasion and makes her prone ulceration.  We have provided her with a  prescription for the local orthotist to consider custom inserts and  extra volume toe box on her shoes.  We have explained clinical  impression, specifically that she will not need a recurrent management  in the wound center as she has no active wounds.  We have given the  husband and the patient an opportunity to ask questions.  They seem to  understand the foregoing discussion and express gratitude for having  been seen in the clinic.      Harold A. Tanda Rockers, M.D.  Electronically Signed     HAN/MEDQ  D:  08/21/2007  T:  08/22/2007  Job:  [id number]   cc:   Doreen Beam, MD

## 2010-12-22 NOTE — Assessment & Plan Note (Signed)
OFFICE VISIT   Meghan, [name]  DOB:  07/23/1956                                        June 14, 2007  CHART #:  [id number]   HISTORY:  Meghan [name] returns today for followup after repeat CT  scan of the neck and chest on June 12, 2007.  This shows inflammatory  changes around the left sternoclavicular joint with associated erosion  of the left clavicular head, which has not changed.  There is evidence  of mild inflammatory involvement of the right sternoclavicular joint.  She said that she is not having any fever or chills.  She did note  earlier this week development of pain and tenderness over the right  sternoclavicular joint as well as pain in her right shoulder and right  neck which she did not have before.  She has also noticed the patchy  erythema that was present on the left side has migrated over to the  midline and now over towards the right side.  She continues to have some  pain and tenderness over the left sternoclavicular joint.  She said that  she is moving her left arm better.  She now has pain when she moves her  right arm.   PHYSICAL EXAMINATION:  Vital signs:  On physical examination today her  blood pressure is 124/79 and pulse is 104 and regular.  Temperature is  99.4.  oxygen saturation on room air is 94%.  General:  She looks  comfortable.  Chest:  There is mild patchy erythema across the upper  chest from left to right.  This has increased since I last saw her.  There is tenderness over both sternoclavicular joints.  There is mild  swelling over this area.  There is no fluctuance or induration.  Lungs:  Her lungs are clear.  Cardiac:  Exam shows a regular rate and rhythm  with normal heart sounds.   IMPRESSION:  Meghan [name] has persistent signs and symptoms of left  sternoclavicular joint infection and inflammation with osteomyelitis.  She has developed progressive symptoms on the right side without  evidence  of bony destruction.  She has received a full course of  intravenous antibiotics followed by oral antibiotics that have failed to  resolve this completely.  Since her antibiotics were discontinued, she  seems to have worsened.  I think the best option at this point is to  proceed with surgical drainage and debridement of the left  sternoclavicular joint and exploration of the right sternoclavicular  area.  I discussed the operative procedure with her and her husband  including alternatives of continued antibiotics, which I feel probably  would not resolve the problem.  We discussed the benefits and risks of  surgery including, but not limited to, bleeding, blood transfusion,  persistent infection, injury to the vascular or neurological structures  in the area, need to remove the clavicular heads or some of the costal  cartilages as well as part of the sternum with associated chest wall  dysfunction, and she understands and agrees to proceed.  We will plan to  do this on Friday.   Evelene Croon, M.D.  Electronically Signed   BB/MEDQ  D:  06/14/2007  T:  06/15/2007  Job:  [id number]

## 2010-12-22 NOTE — Discharge Summary (Signed)
NAME:  Meghan [name], Meghan [name] NO.:  [id number]   MEDICAL RECORD NO.:  [id number]          PATIENT TYPE:  INP   LOCATION:  6730                         FACILITY:  [facility]   PHYSICIAN:  Herbie Saxon, MDDATE OF BIRTH:  November 01, 1955   DATE OF ADMISSION:  03/24/2007  DATE OF DISCHARGE:                               DISCHARGE SUMMARY   CONSULTS:  Dr. Laneta Simmers, general surgery.   DIAGNOSES:  1. Mediastinitis.  2. Left sternocleidomastoid joint infection.  3. Urinary tract infection.  4. Diabetes.  5. Hypertension.  6. Oral thrush.  7. Morbid obesity.  8. Anemia of chronic disease.  9. Hypokalemia repleted.  10.Query left arm deep venous thrombosis.   RADIOLOGY:  The CT of the neck of March 27, 2007 shows phlegmon and  edema extending into the left superior mediastinum and anterior chest.  There may be involvement of the left sternoclavicular joint. There is no  evidence of osteomyelitis or abscess. Likely active  lymphadenopathy and  a small left pleural effusion. The orthopantogram of March 27, 2007  showed significant dental carries with periodontal disease, query  periapical abscess of 2 of the remaining teeth in the lower mandible. CT  chest of March 27, 2007 showed findings consistent with superior  mediastinum inflammation __. Chest x-ray of March 26, 2007 status post  PICC line showed no pneumothorax.   HOSPITAL COURSE:  This 55 year old lady was admitted with left-sided  chest pain  withl left  sternoclavicular joint infection being  entertained. She  presented with the fevers and neck pain initially  being treated by her primary care physician, Dr. Sherril Croon for urinary tract  infection with Cipro and analgesics  for pain control. However, she  continued to have a high-grade fever and a CT of the neck did reveal  findings concerning for mediastinitis. Dr. Laneta Simmers was consulted, not  present, he is agreeing with continuing the IV Zosyn and Vancomycin. The  initial Unasyn was discontinued. There is no apparent abscess at the  present. The patient was observed to have carious mandibular teeth and  dental consult was sought. The orthopantogram did reveal possible  periapical abscess with gum disease. This is being followed by the  maxillofacial surgeon.   Final medication to be dictated on discharge. The patient will probably  need IV antibiotics for about 2 weeks at home utilizing PICC line.   PHYSICAL EXAMINATION:  GENERAL:  She is an elderly lady not in acute  distress.  VITAL SIGNS:  Temperature 98, pulse is 111, respiratory rate is 18,  blood pressure is 124/64, pale clinically.  NECK:  Supple.  She had mild left sternocleidomastoid joint; however, she has left upper  extremity edema. Doppler of the veins is pending  to rule out DVT of the  left arm.  HEART:  S1 and S2 regular.  ABDOMEN:  Benign.  NEUROLOGIC:  Alert and oriented x3. No pedal edema.   PLAN:  The patient is to be continued on IV Zosyn and Vancomycin to  which she is responding. Will follow the vascular Doppler to rule out  DVT. If this is positive to increase Lovenox dose to 1mg /kg  subcu q.12  dose. We will monitor a CBC in the morning. Continue p.o. and pain  medication. Her diabetes has been optimized with Lantus insulin 36 units  a.m. and 34 units q.h.s. Blood pressure is adequately controlled.   LABORATORY DATA:  The WBC today is 15, hematocrit 30, platelet count  482. Chemistry shows a glucose of 110, sodium 137, potassium 3.5,  chloride 101, bicarbonate , BUN 5, creatinine 0.6.   PRIMARY CARE PHYSICIAN:  Dr. Sherril Croon.   SURGEON:  Evelene Croon, M.D.      Herbie Saxon, MD  Electronically Signed     MIO/MEDQ  D:  03/28/2007  T:  03/28/2007  Job:  [id number]

## 2010-12-22 NOTE — Discharge Summary (Signed)
NAME:  Meghan [name], Meghan [name] NO.:  [id number]   MEDICAL RECORD NO.:  [id number]          PATIENT TYPE:  INP   LOCATION:  2025                         FACILITY:  [facility]   PHYSICIAN:  Evelene Croon, M.D.     DATE OF BIRTH:  04/11/1956   DATE OF ADMISSION:  06/16/2007  DATE OF DISCHARGE:  06/21/2007                               DISCHARGE SUMMARY   PRIMARY ADMITTING DIAGNOSIS:  1. Left supraclavicular joint infection.  2. Osteomyelitis.   ADDITIONAL DISCHARGE DIAGNOSES:  1. Supraclavicular joint infection with osteomyelitis.  2. Type 2 insulin dependent diabetes mellitus.  3. Hypertension.  4. Hypercholesterolemia.  5. 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  6. History of vulvar pre cancer status post resection.   PROCEDURES PERFORMED:  1. Left supraclavicular joint debridement.  2. Placement of VAC.   HISTORY:  The patient is a 55 year old female who was admitted to [facility] in August of 2008 with complaints of left neck pain.  She had been  previously seen in the emergency department at [facility] and  treated with anti-inflammatory medications without improvement.  She was  subsequently seen by her primary care physician and was noted to have a  leukocytosis without fever.  At that time a CT scan of the neck was  performed which showed left sternoclavicular joint infection with  extension into the mediastinum.  Because of this she was transferred to  [facility] and was treated by the hospitalist service.  She was started  on IV antibiotics and her symptoms began to improve.  During that  admission she was seen in consultation by Dr. Evelene Croon for  consideration of surgical intervention.  During that admission, however,  she sustained a left upper extremity DVT and was started on heparin and  Coumadin.  She recovered and was eventually discharged home on oral  antibiotics.  A repeat CT scan was performed on May 02, 2007, and  showed improvement in the left clavicular area with no evidence of  abscess but there were changes evident which were felt to be related to  osteomyelitis.  She has continued to have pain in the sternoclavicular  joint area and her shoulder following discontinuation of antibiotics and  was seen back in the office recently by Dr. Laneta Simmers in followup with a  repeat CT scan.  At the time he felt that she should proceed with  surgical intervention since she had failed antibiotic therapy alone.  He  explained the risks, benefits and alternatives of surgery to the patient  and she agreed to proceed.   HOSPITAL COURSE:  Meghan [name] was admitted on June 16, 2007, and  underwent surgical debridement of the left supraclavicular joint and  placement of a wound VAC.  Intraoperatively it was noted that she had  significant inflammation of the joint with a mild amount of seropurulent  drainage.  Cultures were obtained at the time of surgery and at this  point have yielded no growth.  She tolerated the procedure well and was  transferred to the floor in stable condition.  She has been continued  postoperatively on antibiotic coverage  with Zosyn.  She has also been  treated with nonsteroidal anti-inflammatory medications.  Her wound VAC  has been changed on Monday, Wednesday and Friday and at this point the  wound is granulating well without evidence of purulence.  She has  remained afebrile.  Her vital signs have been stable throughout her  admission.  Her only postoperative difficulty has been hyperglycemia.  Despite restart of her home doses of Lantus and oral hyperglycemic  agents her blood sugars have continued to run in the 300-400 range.  Her  glucose control at home has been good and she reports that her  preoperative hemoglobin A1c was around 8.  Her Lantus dose has been  adjusted accordingly and presently her sugars have trended down and are  now in the 160-190 range.  Otherwise she is  doing well.  Her most recent  labs show hemoglobin 8.6, hematocrit 25.8, white count 13, platelets  429, sodium 140, potassium 4.4, BUN 9, creatinine 0.75.  She will  undergo VAC change today June 21, 2007.  If her wound has continued  to granulate well and she has had no other complicating factor she will  hopefully be ready for discharge home later in the day provided that a  home outpatient VAC as well as home health nursing can be arranged.   DISCHARGE MEDICATIONS:  Are as follows:  1. Augmentin 500 mg b.i.d. x2 weeks.  2. Lantus 70 units daily at bedtime.  3. Tylox 1-2 q.4 h p.r.n. for pain.  4. Metformin 1000 mg b.i.d.  5. Enalapril 5 mg b.i.d.  6. Folic acid 1 mg daily.  7. Simvastatin 40 mg daily at bedtime,.  8. Lopressor 50 mg daily.   DISCHARGE INSTRUCTIONS:  She is asked to refrain from heavy lifting,  driving or strenuous activity.  She may continue ambulating daily and  using her incentive spirometer.  Home health nurse will be arranged for  wound VAC changes Monday, Wednesday and Friday.   DISCHARGE FOLLOWUP:  She will need to follow up with Dr. Sherril Croon regarding  her blood sugars in the next week for possible adjustment of her  medications.  She will then follow up in 2 weeks with Dr. Laneta Simmers.  In  the interim if she experiences problems or has questions she may contact  our office.      Coral Ceo, P.A.      Evelene Croon, M.D.  Electronically Signed    GC/MEDQ  D:  06/21/2007  T:  06/21/2007  Job:  [id number]   cc:   Evelene Croon, M.D.  Dhruv Sherril Croon

## 2010-12-22 NOTE — Assessment & Plan Note (Signed)
OFFICE VISIT   Meghan, [name]  DOB:  1955-08-31                                        August 15, 2007  CHART #:  [id number]   Meghan [name] returns today for follow-up of her left sternoclavicular  joint osteomyelitis status post debridement of the joint on June 16, 2007.  She was last in our office on July 28, 2007, at which time  she saw Dr. Donata Clay and was noted to have some infection in her right  third toe.  She was started on oral Cipro by her primary physician and  Dr. Donata Clay referred her to the wound care clinic at [facility]. [facility]  She has an appointment to be seen in the clinic in  the next few weeks.  She continues to complain of some pain in both  shoulders, being worse on the right than the left as well as beneath the  right axilla.  She also had some pains up into both sides of her neck.  He denies any fever or chills.  She has also had watery diarrhea for a  couple of weeks as well as some nausea and vomiting.  She said that she  was given a specimen cup by Dr. Sherril Croon to get a stool specimen which she  has not done yet.   PHYSICAL EXAMINATION:  VITAL SIGNS:  Today her blood pressure is 94/65  and her pulse is 76 and regular.  Respiratory rate is 16 and unlabored.  GENERAL APPEARANCE:  She looks well.  CHEST:  The incision from her sternoclavicular joint debridement is  essentially completely healed.  There is minimal residual granulation  tissue at the surgical site.  There is no erythema over this area or  over the sternoclavicular joint.  There is no significant tenderness and  no redness.  There is no swelling.  No other chest wall abnormality is  noted.   IMPRESSION:  Meghan [name] continues to have pains in her neck and  shoulders as well as under her right axilla which could be related to  the inflammatory process involving the sternoclavicular joints.  At the  present time, there is no overt  sign of active inflammation or infection  that I can see externally.  She is not having any fever.  I told her  that I would like to continue observing this for a while before  repeating a CT scan.  I think the benefit of repeating a CT scan without  some external signs of inflammation or infection is probably of  low  yield.  She does have watery diarrhea and has been on at least two  different antibiotics.  One of these was Cipro.  I think there is a high  likelihood that she has Clostridium difficile colitis.  I asked her to  get a stool specimen today as directed by Dr. Sherril Croon and I started her on  Flagyl 250 mg t.i.d. x10 days empirically since I think this is most  likely Clostridium difficile colitis.  This may also be responsible for  her nausea and vomiting.  Her abdomen is benign at this time.  I will  plan to see her back in two weeks for follow-up.   Evelene Croon, M.D.  Electronically Signed   BB/MEDQ  D:  08/15/2007  T:  08/15/2007  Job:  [id number]

## 2010-12-22 NOTE — Op Note (Signed)
NAME:  Meghan [name], Meghan [name] NO.:  [id number]   MEDICAL RECORD NO.:  [id number]          PATIENT TYPE:  INP   LOCATION:  2550                         FACILITY:  [facility]   PHYSICIAN:  Evelene Croon, M.D.     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   OPERATIVE PROCEDURE:  Incision and drainage and debridement of left  sternoclavicular joint, insertion of vacuum sponge drain.   ATTENDING SURGEON:  Evelene Croon, M.D.   ASSISTANT:  Rowe Clack, P.A.-C.   ANESTHESIA:  General endotracheal.   CLINICAL HISTORY:  This patient is a 55 year old diabetic woman who I  have been following for a couple months with an inflammatory process  around the left sternoclavicular joint region.  She was initially  hospitalized with swelling, induration and erythema in the left neck  region as well as in the left supraclavicular region.  CT scan confirmed  inflammation in this area with stranding in the subcutaneous and deep  tissues.  It is felt that there is possible mediastinitis.  She was  treated with intravenous antibiotics, no bacteria were isolated for  blood cultures.  She improved clinically.  She had a followup CT scan  that showed improvement in the inflammation but did show some  destruction of the left sternoclavicular joint with possible  osteomyelitis.  She was continued on oral antibiotics as an outpatient  and followed up in the office and had a repeat CT scan of the chest that  was essentially unchanged but continued to show destruction of the left  sternoclavicular joint.  There was also question of some inflammation  around the right sternoclavicular joint.  Since her symptoms persisted  and she continued to have an  elevated white blood cell count and CT scan  findings as noted above, I felt the best treatment would be to proceed  with incision and drainage and debridement of the left sternoclavicular  joint.  I discussed the operative procedure with her and her husband  including her alternatives, benefits and risks including but not limited  to bleeding, blood transfusion, persistent infection, need for further  drainage procedures, possible shoulder girdle dysfunction due to  clavicular debridement and scarring.  She understood and agreed to  proceed.   OPERATIVE PROCEDURE:  The patient was taken to the operating room and  placed on the table in the supine position.  After induction of general  endotracheal anesthesia, a Foley catheter was placed in the bladder.  Lower extremity venous compression stockings were placed.  Preoperative  antibiotics were not given so that we could obtain operative cultures.  Then the neck and chest were prepped with Betadine soaping solution,  draped in the usual sterile manner.  An incision was then made over the  superior border of the left clavicle over the medial one-third and  carried down across the manubrium.  Electrocautery was used to divide  the  subcutaneous tissue down to the sternoclavicular joint.  The joint  was entered and there was complete destruction of the joint with no  cartilage remaining.  The joint had essentially dissolved.  There was  about 10 mL of turbid joint fluid present and this was sent for culture.  The debris within the joint was removed using rongeurs and sent for  culture.  The head of the left clavicle had mild osteomyelitis  clinically.  I debrided the head with rongeurs back to viable appearing  bleeding bone.  The first rib was identified anteriorly and this  appeared intact.  The costoclavicular ligaments were left intact.  The  manubrium itself appeared intact.  This process did not appear to extend  over towards the right  sternoclavicular joint.  There were no fluid  collections noted other than within the left sternoclavicular joint  itself.  There were no abscesses.  I felt at this time that it would not  be necessary to open up the right sternoclavicular joint.  There was  complete hemostasis.  A wound VAC sponge was cut to the appropriate size  and shape and positioned in the wound.  This was covered with the  occlusive dressing and then connected to the suction system at 25 mmHg.  This appeared to work well.  The patient was then awakened, extubated  and transported to the postanesthesia care unit in a satisfactory and  stable condition.      Evelene Croon, M.D.  Electronically Signed     BB/MEDQ  D:  06/16/2007  T:  06/16/2007  Job:  [id number]   cc:   Evelene Croon, M.D.

## 2010-12-22 NOTE — Assessment & Plan Note (Signed)
OFFICE VISIT   Meghan [name], Meghan [name]  DOB:  02-23-1956                                        July 14, 2007  CHART #:  [id number]   CURRENT PROBLEMS:  1. Status post debridement of left sternoclavicular joint infection      for osteomyelitis November 2008.  2. Diabetes mellitus.  3. Hypertension.   HISTORY OF PRESENT ILLNESS:  Meghan [name] returns for wound check  after debridement of the left sternoclavicular joint which was involved  with an infection and osteomyelitis.  The cultures from the  intraoperative specimens were no growth.  She has finished a course of  oral antibiotics as well as IV antibiotics when she was hospitalized.  She has transitioned from a wound VAC system to wet-to-dry dressing  changes with home health nurses and her husband providing the wound  care.  She is less tender, afebrile, and overall feeling stronger.  She  still takes some pain medication.  The wound is healing secondarily and  rapidly showing improvement.   PHYSICAL EXAMINATION:  VITAL SIGNS:  She is afebrile with a temperature  of 98.4, pulse 88, blood pressure 103/60.  SKIN:  The wound is superficial, mainly involving only the dermis.  CHEST:  Stable.  Breath sounds are clear.  CARDIAC:  Rhythm is regular.   PLAN:  Topical wound care will be continued on a daily basis.  I will  plan on seeing her back in two weeks for a wound check.  Chest x-ray  taken today shows improvement from her previous study showing some  postoperative changes and basilar atelectasis.  She now has faint  prominence of the left upper mediastinum from soft tissue changes;  however, this is less dense than on the previous chest x-ray.   Meghan [name], M.D.  Electronically Signed   PV/MEDQ  D:  07/14/2007  T:  07/15/2007  Job:  [id number]

## 2010-12-22 NOTE — Assessment & Plan Note (Signed)
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  Please refer to my previous consultation  done at the time of her hospital admission on 03/24/2007.  She was  treated with intravenous antibiotics during that hospitalization and was  seen by dental medicine.  She was felt to have multiple bad teeth with  retained roots and chronic periodontitis.  She underwent extraction of  her remaining lower teeth and alveoloplasty.  With intravenous  antibiotics she continued to improve.  The cellulitis, warmth and  tenderness over the left neck and supraclavicular resolved.  She has had  this since discharge.  She has continued to have some pain in her left  shoulder, as well as down her left arm with any type of movement of the  left upper extremity.  She denies any fever or chills.  She has noticed  some swelling over the left sternoclavicular joint, which she says seems  to wax and wane at different times.   PHYSICAL EXAMINATION:  Blood pressure 117/76; pulse 94 and regular;  respiratory rate 18 and unlabored; oxygen saturation on room air is 95%.  She looks well.  There is no erythema or induration over the left neck  and supraclavicular area as there was in the hospital.  There are some  palpable lymph nodes present along the anterior border of the  sternocleidomastoid muscle on the left.  There is some prominence of the  area over the left sternoclavicular joint although it is nontender and  not warm.  Lungs: Are clear.  CARDIAC:  Shows a regular rate and rhythm  with normal heart sounds.   IMPRESSION:  The patient has some deep soft tissue infection involving  the left neck,  supraclavicular joint region and anterior mediastinum.  The etiology of this is not completely clear.  This did improve with  intravenous antibiotics.  She still has some pain in her left shoulder  and arm with movement of the left upper extremity that may be related to  the left sternoclavicular joint.  There are no signs of active infection  at this time although she has remained on oral antibiotics including  Doxycycline 100 mg b.i.d. and Amoxicillin 875 mg b.i.d.  These are  scheduled to continue for one more week.  I think it would be best to  repeat her CT scan of the neck and chest to re-evaluate this area to see  if there has been any change.  She will stop antibiotics after one more  week and then we will have to see if there is any recurrence of the  erythema, swelling and induration.  I will plan to see her back in 1  week after the CT scan has been performed.   Evelene Croon, M.D.  Electronically Signed   BB/MEDQ  D:  04/25/2007  T:  04/26/2007  Job:  [id number]

## 2010-12-22 NOTE — Assessment & Plan Note (Signed)
OFFICE VISIT   Meghan [name], Meghan [name]  DOB:  1956-02-18                                        May 03, 2007  CHART #:  [id number]   This patient returned today for followup of possible left  sternoclavicular joint infection and mediastinitis.  She underwent  repeat CT scan of the neck and chest today which showed marked  improvement in the edema and phlegmon along the distal anterior left  sternocleidomastoid muscle.  There was some new irregularity of the  mineralization of the left medial clavicle suggesting a possible  osteomyelitis.  There was no mediastinal or hilar adenopathy, no abscess  is seen.   She said that she feels about the same.  She still has pain in her left  shoulder and arm with any motion of the left shoulder joint.  She has  had no fevers or chills.  She is still taking the same antibiotics.   PHYSICAL EXAMINATION:  She is afebrile.  Blood pressure is 115/77, pulse  98 and regular, respiratory rate is 18 and saturation is 99% on room  air.  She looks well.  There continues to be some prominence of the left  sternoclavicular joint on palpation.  This area is not erythematous and  is nontender.  There is no induration, tenderness or erythema around the  neck or chest.   IMPRESSION:  It is unclear whether the patient has osteomyelitis  involving the left sternoclavicular joint.  There are some new changes  on CT scan in the medial left clavicle to suggest this.  She is doing  well otherwise.   I think it probably would be best to continue the antibiotics for  another month and watch this further before making a decision about  surgical treatment.  This would be a fairly morbid procedure in this  patient and would require removal of the head of the left clavicle as  well as debridement of the sternoclavicular joint and would leave an  open wound.  I will continue her on Doxycycline 100 mg twice daily and  Augmentin 875 mg  twice daily x1 more month.  I will plan to re-evaluate  her at that time and may repeat her CT scan to see what things look  like.  I think if she has further destruction of the clavicle or  sternoclavicular joint or recurrent fevers after antibiotics have been  discontinued, then open surgical debridement would be necessary.  I  discussed the plan with the patient and her husband and they understand  and agree with that plan.   Evelene Croon, M.D.  Electronically Signed   BB/MEDQ  D:  05/03/2007  T:  05/04/2007  Job:  [id number]

## 2010-12-22 NOTE — Assessment & Plan Note (Signed)
OFFICE VISIT   Meghan [name], Meghan [name]  DOB:  05-04-1956                                        September 05, 2007  CHART #:  [id number]   Meghan [name] returns today for followup of her left sternoclavicular  joint osteomyelitis status post debridement of the joint on June 16, 2007.  I last saw her on January 6th.  She continued to have pain in her  neck and shoulders at that time as well as under her right axilla but  there were no active signs of inflammation or infection that I could see  externally.  She was having some diarrhea at that time and I started her  on Flagyl empirically and asked her to have a Clostridium difficile  test.  She said that she was actually admitted to the hospital for a  couple days by her primary physician and underwent upper endoscopy which  she did not know the results of.  She was treated with Reglan and a  proton pump inhibitor.  She said that her stool was tested for C.  difficile and it was negative and her diarrhea gradually resolved.  She  said her physician felt that she probably had a gastrointestinal virus.  She is currently feeling well over all and denies any fever or chills.  The pain in her neck and shoulders has markedly improved and she is  planning on going back to work next week.   PHYSICAL EXAMINATION:  The blood pressure is 116/79, pulse is 88 and  regular.  Saturation 98% on room air.  She looks well.  The incision is  well healed.  There is no swelling or erythema.  There is mild  tenderness over the incision.  She has good range of motion both upper  extremities and normal strength.   IMPRESSION:  Overall, Meghan [name] is recovering well and currently  shows no signs of continued inflammation or infection.  I think it is  okay for her to go back to work.  I asked her to contact our office  immediately if she develops any fever, redness, or swelling around the  neck or upper chest.   Evelene Croon, M.D.  Electronically Signed   BB/MEDQ  D:  09/05/2007  T:  09/05/2007  Job:  [id number]

## 2010-12-22 NOTE — Op Note (Signed)
NAME:  Meghan [name], Meghan [name] NO.:  [id number]   MEDICAL RECORD NO.:  [id number]          PATIENT TYPE:  INP   LOCATION:  6730                         FACILITY:  [facility]   PHYSICIAN:  Charlynne Pander, D.D.S.DATE OF BIRTH:  [birth year]   DATE OF PROCEDURE:  03/30/2007  DATE OF DISCHARGE:                               OPERATIVE REPORT   PREOPERATIVE DIAGNOSES:  1. Mediastinitis.  2. Left sternoclavicular joint infection.  3. Chronic periodontitis.  4. Retained roots.   POSTOPERATIVE DIAGNOSES:  1. Mediastinitis.  2. Left sternoclavicular joint infection.  3. Chronic periodontitis.  4. Retained roots.   OPERATIONS:  1. Extraction remaining lower teeth numbers 20, 21, 22, 27, 28, 29 and      30.  2. Two quadrants of alveoloplasty.   SURGEON:  Charlynne Pander, D.D.S.   ASSISTANT:  Elliot Dally (Sales executive).   ANESTHESIA:  Monitored anesthesia care per the Anesthesia Team.   MEDICATIONS:  1. Vancomycin IV per protocol.  2. Zosyn 3.375 grams IV every 6 hours.  3. Local anesthesia with a total utilization of five carpules each      containing 34 mg of Xylocaine with 0.017 mg of epinephrine per      carpule.   SPECIMENS:  There were seven teeth which were discarded.   CULTURES:  None.   DRAINS:  None.   COMPLICATIONS:  None.   FLUIDS:  400 mL lactated Ringer solution.   ESTIMATED BLOOD LOSS:  Less than 25 mL.   INDICATIONS:  The patient was recently diagnosed with mediastinitis and  left sternoclavicular joint infection.  The dental consultation was  requested to rule out dental etiology for the joint infection.  The  patient was examined and treatment planned for multiple extractions of  remaining teeth with alveoloplasty as indicated.  This treatment plan  was formulated to decrease the risk of complications associated with  dental infection from an affecting the patient's systemic health.   OPERATIVE FINDINGS:  The patient was examined  operating room #3.  The  teeth were identified for extraction.  The patient is noted be affected  by chronic periodontitis, retained root segments, and mobile teeth.  The  aforementioned necessitated removal of all remaining teeth with  alveoloplasty as indicated.   DESCRIPTION OF PROCEDURE:  The patient was brought to the main operating  room #3.  The patient was then placed in supine position operating room  table.  Monitored anesthesia care was then induced per the Anesthesia  Team.  The patient was then prepped and draped in the usual manner for  dental medicine procedure.  A time-out was performed and the patient was  identified and procedures verified.  The oral cavity was thoroughly  examined with findings noted above.  The patient was then ready for the  dental medicine procedure as follows.   Local anesthesia administered at the start of dental medicine procedure  with a total utilization of five carpules each containing 34 mg of  Xylocaine with 0.017 mg of epinephrine.   The mandibular left and right quadrants first approached.  Anesthesia  delivered utilizing Xylocaine with epinephrine and bilateral inferior  alveolar nerve blocks on the left and right sides.  Further infiltration  was then achieved utilizing the Xylocaine with epinephrine.  A 15 blade  incision was then made from the distal of #18 and extended to the mesial  of number 32.  A surgical flap was then carefully reflected.  Appropriate amounts of buccal and interseptal bone was removed at this  time.  Teeth were subluxated and tooth numbers 20, 21, 22, 27, 28, 29  and 30 were then removed with a 151 forceps without complications.  Alveoplasty was performed utilizing rongeurs and bone file.  The  surgical sites were then irrigated with copious amounts sterile saline.  The mandibular left quadrant was then closed after the tissues were  approximated trimmed appropriately.  Surgical site was closed from the   distal of number 18 extended to the mesial number 24 utilizing 3-0  chromic gut suture in a continuous interrupted suture technique x 1.  The mandibular right quadrant was then closed from the distal of number  31 extended to the mesial of number 25 utilizing 3-0 chromic gut suture  in a continuous interrupted suture technique x 1.   This point time the entire mouth was irrigated with copious amounts  sterile saline.  The patient was examined for complications, seeing  none, dental medicine procedure was deemed to be complete.  A series of  4x4 gauze were placed in the mouth to aid hemostasis at this time.  The  patient was then handed off to the anesthesia team for final  disposition.  After appropriate amount of time, the patient is taken to  the post anesthesia care unit with stable vital signs good oxygenation  level.  All counts were correct for the dental medicine procedure.  Please note:  The maxillary arch was edentulous and no further pre  prosthetic surgery was needed at this time.  The patient does have small  epulis in the mandibular left facial vestibule in the area of number 21-  24.  This may need for further  evaluation for removal of the epulis by an oral surgeon if this does not  heal appropriately.  The patient was informed of this postoperatively  and will seek oral surgery care as indicated.  The patient will be given  appropriate pain medication and then will be seen in approximately one  week for evaluation for suture removal as indicated.      Charlynne Pander, D.D.S.  Electronically Signed     RFK/MEDQ  D:  03/30/2007  T:  03/31/2007  Job:  [id number]   cc:   Kerin Perna, M.D.

## 2011-05-18 LAB — CBC
HCT: 25.2 — ABNORMAL LOW
HCT: 25.8 — ABNORMAL LOW
HCT: 32.8 — ABNORMAL LOW
Hemoglobin: 10.7 — ABNORMAL LOW
MCHC: 32.8
Platelets: 353
Platelets: 367
Platelets: 429 — ABNORMAL HIGH
RBC: 3.05 — ABNORMAL LOW
RDW: 17.2 — ABNORMAL HIGH
RDW: 17.5 — ABNORMAL HIGH
WBC: 13 — ABNORMAL HIGH
WBC: 16 — ABNORMAL HIGH
WBC: 18.6 — ABNORMAL HIGH

## 2011-05-18 LAB — URINE MICROSCOPIC-ADD ON

## 2011-05-18 LAB — URINALYSIS, ROUTINE W REFLEX MICROSCOPIC
Glucose, UA: 250 — AB
Glucose, UA: >1000 — AB
Ketones, ur: 15 — AB
Ketones, ur: NEGATIVE
Nitrite: NEGATIVE
Protein, ur: 30 — AB
Urobilinogen, UA: 0.2
Urobilinogen, UA: 0.2
pH: 5

## 2011-05-18 LAB — TYPE AND SCREEN
ABO/RH(D): O POS
Antibody Screen: NEGATIVE

## 2011-05-18 LAB — BASIC METABOLIC PANEL
BUN: 10
BUN: 12
BUN: 9
CO2: 29
Calcium: 7.8 — ABNORMAL LOW
Chloride: 103
Creatinine, Ser: 0.84
GFR calc Af Amer: >60
GFR calc Af Amer: >60
GFR calc non Af Amer: >60
GFR calc non Af Amer: >60
Glucose, Bld: 265 — ABNORMAL HIGH
Potassium: 3.5
Potassium: 4.4

## 2011-05-18 LAB — BODY FLUID CULTURE: Culture: NO GROWTH

## 2011-05-18 LAB — TISSUE CULTURE
Culture: NO GROWTH
Gram Stain: NONE SEEN

## 2011-05-18 LAB — ANAEROBIC CULTURE

## 2011-05-18 LAB — BLOOD GAS, ARTERIAL
Bicarbonate: 23.1
Drawn by: 181601
FIO2: 0.21
TCO2: 24.2
pCO2 arterial: 35.5
pO2, Arterial: 79.9 — ABNORMAL LOW

## 2011-05-18 LAB — COMPREHENSIVE METABOLIC PANEL
Albumin: 2.3 — ABNORMAL LOW
Alkaline Phosphatase: 110
BUN: 32 — ABNORMAL HIGH
CO2: 21
Calcium: 8.4
GFR calc non Af Amer: 46 — ABNORMAL LOW

## 2011-05-18 LAB — URINE CULTURE
Colony Count: NO GROWTH
Culture: NO GROWTH
Special Requests: NEGATIVE

## 2011-05-18 LAB — PROTIME-INR: INR: 1.3

## 2011-05-18 LAB — GLUCOSE, RANDOM: Glucose, Bld: 399 — ABNORMAL HIGH

## 2011-05-21 LAB — CBC
HCT: 29 — ABNORMAL LOW
HCT: 29.1 — ABNORMAL LOW
HCT: 29.3 — ABNORMAL LOW
HCT: 29.8 — ABNORMAL LOW
HCT: 29.9 — ABNORMAL LOW
HCT: 30.8 — ABNORMAL LOW
HCT: 31.6 — ABNORMAL LOW
HCT: 32.2 — ABNORMAL LOW
HCT: 32.7 — ABNORMAL LOW
HCT: 33.6 — ABNORMAL LOW
Hemoglobin: 10 — ABNORMAL LOW
Hemoglobin: 10.2 — ABNORMAL LOW
Hemoglobin: 10.8 — ABNORMAL LOW
Hemoglobin: 9.5 — ABNORMAL LOW
Hemoglobin: 9.7 — ABNORMAL LOW
Hemoglobin: 9.8 — ABNORMAL LOW
MCHC: 33
MCHC: 33.3
MCHC: 33.3
MCHC: 33.4
MCHC: 33.7
MCHC: 34
MCV: 86.9
MCV: 87
MCV: 87.1
MCV: 87.1
MCV: 87.1
MCV: 87.2
MCV: 87.6
MCV: 87.9
MCV: 88.4
MCV: 88.7
Platelets: 347
Platelets: 375
Platelets: 482 — ABNORMAL HIGH
Platelets: 515 — ABNORMAL HIGH
Platelets: 523 — ABNORMAL HIGH
Platelets: 530 — ABNORMAL HIGH
Platelets: 552 — ABNORMAL HIGH
Platelets: 587 — ABNORMAL HIGH
Platelets: 592 — ABNORMAL HIGH
Platelets: 597 — ABNORMAL HIGH
Platelets: 607 — ABNORMAL HIGH
RBC: 3.3 — ABNORMAL LOW
RBC: 3.42 — ABNORMAL LOW
RBC: 3.43 — ABNORMAL LOW
RBC: 3.56 — ABNORMAL LOW
RBC: 3.74 — ABNORMAL LOW
RDW: 15.6 — ABNORMAL HIGH
RDW: 15.7 — ABNORMAL HIGH
RDW: 15.8 — ABNORMAL HIGH
RDW: 15.8 — ABNORMAL HIGH
RDW: 16 — ABNORMAL HIGH
RDW: 16.1 — ABNORMAL HIGH
RDW: 16.2 — ABNORMAL HIGH
RDW: 16.4 — ABNORMAL HIGH
WBC: 11.6 — ABNORMAL HIGH
WBC: 12.7 — ABNORMAL HIGH
WBC: 14.3 — ABNORMAL HIGH
WBC: 15.4 — ABNORMAL HIGH
WBC: 16.2 — ABNORMAL HIGH
WBC: 16.4 — ABNORMAL HIGH
WBC: 16.6 — ABNORMAL HIGH
WBC: 19.7 — ABNORMAL HIGH
WBC: 9.6

## 2011-05-21 LAB — BASIC METABOLIC PANEL
BUN: 5 — ABNORMAL LOW
BUN: 6
BUN: 7
BUN: 7
BUN: 7
BUN: 8
BUN: 8
BUN: 8
BUN: 8
CO2: 27
CO2: 27
CO2: 28
CO2: 30
CO2: 30
Calcium: 8.6
Calcium: 9
Calcium: 9
Calcium: 9.2
Calcium: 9.2
Calcium: 9.2
Chloride: 100
Chloride: 101
Chloride: 101
Chloride: 103
Chloride: 103
Chloride: 104
Chloride: 105
Chloride: 98
Chloride: 99
Creatinine, Ser: 0.61
Creatinine, Ser: 0.61
Creatinine, Ser: 0.66
Creatinine, Ser: 0.67
Creatinine, Ser: 0.68
GFR calc Af Amer: >60
GFR calc Af Amer: >60
GFR calc Af Amer: >60
GFR calc Af Amer: >60
GFR calc Af Amer: >60
GFR calc non Af Amer: >60
GFR calc non Af Amer: >60
GFR calc non Af Amer: >60
GFR calc non Af Amer: >60
GFR calc non Af Amer: >60
Glucose, Bld: 106 — ABNORMAL HIGH
Glucose, Bld: 109 — ABNORMAL HIGH
Glucose, Bld: 110 — ABNORMAL HIGH
Glucose, Bld: 114 — ABNORMAL HIGH
Glucose, Bld: 166 — ABNORMAL HIGH
Glucose, Bld: 211 — ABNORMAL HIGH
Glucose, Bld: 277 — ABNORMAL HIGH
Glucose, Bld: 92
Potassium: 3.3 — ABNORMAL LOW
Potassium: 3.3 — ABNORMAL LOW
Potassium: 3.6
Potassium: 3.6
Potassium: 4
Potassium: 4
Potassium: 4.1
Sodium: 138
Sodium: 139
Sodium: 139
Sodium: 140
Sodium: 142

## 2011-05-21 LAB — DIFFERENTIAL
Basophils Absolute: 0
Basophils Absolute: 0.1
Eosinophils Relative: 2
Lymphocytes Relative: 28
Lymphocytes Relative: 30
Monocytes Absolute: 0.9 — ABNORMAL HIGH
Neutro Abs: 5.4
Neutro Abs: 5.7
Neutrophils Relative %: 61

## 2011-05-21 LAB — PROTIME-INR
INR: 1.1
INR: 1.2
INR: 1.9 — ABNORMAL HIGH
INR: 2.9 — ABNORMAL HIGH
Prothrombin Time: 14.4
Prothrombin Time: 14.8
Prothrombin Time: 15.9 — ABNORMAL HIGH
Prothrombin Time: 22.8 — ABNORMAL HIGH
Prothrombin Time: 29.7 — ABNORMAL HIGH

## 2011-05-21 LAB — VANCOMYCIN, TROUGH: Vancomycin Tr: 9.7

## 2011-05-21 LAB — URINE MICROSCOPIC-ADD ON

## 2011-05-21 LAB — URINALYSIS, ROUTINE W REFLEX MICROSCOPIC
Glucose, UA: 100 — AB
Leukocytes, UA: NEGATIVE
pH: 6.5

## 2011-05-21 LAB — CULTURE, BLOOD (ROUTINE X 2)

## 2011-05-21 LAB — APTT: aPTT: 39 — ABNORMAL HIGH

## 2011-05-21 LAB — HEMOGLOBIN A1C: Hgb A1c MFr Bld: 8.5 — ABNORMAL HIGH

## 2012-02-15 ENCOUNTER — Encounter: Payer: Self-pay | Admitting: Internal Medicine

## 2012-03-01 ENCOUNTER — Encounter: Payer: Self-pay | Admitting: Internal Medicine

## 2012-03-01 DIAGNOSIS — E119 Type 2 diabetes mellitus without complications: Secondary | ICD-10-CM

## 2012-03-01 DIAGNOSIS — D509 Iron deficiency anemia, unspecified: Secondary | ICD-10-CM

## 2012-06-30 ENCOUNTER — Emergency Department (HOSPITAL_COMMUNITY): Payer: BC Managed Care – PPO

## 2012-06-30 ENCOUNTER — Inpatient Hospital Stay (HOSPITAL_COMMUNITY)
Admission: EM | Admit: 2012-06-30 | Discharge: 2012-07-06 | DRG: 549 | Disposition: A | Discharge status: Home Health | Payer: BC Managed Care – PPO | Attending: Internal Medicine | Admitting: Internal Medicine

## 2012-06-30 ENCOUNTER — Encounter (HOSPITAL_COMMUNITY): Payer: Self-pay | Admitting: *Deleted

## 2012-06-30 DIAGNOSIS — L97509 Non-pressure chronic ulcer of other part of unspecified foot with unspecified severity: Secondary | ICD-10-CM | POA: Diagnosis present

## 2012-06-30 DIAGNOSIS — E1142 Type 2 diabetes mellitus with diabetic polyneuropathy: Secondary | ICD-10-CM | POA: Diagnosis present

## 2012-06-30 DIAGNOSIS — N179 Acute kidney failure, unspecified: Secondary | ICD-10-CM

## 2012-06-30 DIAGNOSIS — E1151 Type 2 diabetes mellitus with diabetic peripheral angiopathy without gangrene: Secondary | ICD-10-CM | POA: Diagnosis present

## 2012-06-30 DIAGNOSIS — L02419 Cutaneous abscess of limb, unspecified: Secondary | ICD-10-CM | POA: Diagnosis present

## 2012-06-30 DIAGNOSIS — Z87891 Personal history of nicotine dependence: Secondary | ICD-10-CM

## 2012-06-30 DIAGNOSIS — I10 Essential (primary) hypertension: Secondary | ICD-10-CM

## 2012-06-30 DIAGNOSIS — Z79899 Other long term (current) drug therapy: Secondary | ICD-10-CM

## 2012-06-30 DIAGNOSIS — E7800 Pure hypercholesterolemia, unspecified: Secondary | ICD-10-CM | POA: Diagnosis present

## 2012-06-30 DIAGNOSIS — L0291 Cutaneous abscess, unspecified: Secondary | ICD-10-CM

## 2012-06-30 DIAGNOSIS — E1149 Type 2 diabetes mellitus with other diabetic neurological complication: Secondary | ICD-10-CM | POA: Diagnosis present

## 2012-06-30 DIAGNOSIS — Z7982 Long term (current) use of aspirin: Secondary | ICD-10-CM

## 2012-06-30 DIAGNOSIS — I798 Other disorders of arteries, arterioles and capillaries in diseases classified elsewhere: Secondary | ICD-10-CM

## 2012-06-30 DIAGNOSIS — F329 Major depressive disorder, single episode, unspecified: Secondary | ICD-10-CM | POA: Diagnosis present

## 2012-06-30 DIAGNOSIS — M9080 Osteopathy in diseases classified elsewhere, unspecified site: Secondary | ICD-10-CM | POA: Diagnosis present

## 2012-06-30 DIAGNOSIS — E1165 Type 2 diabetes mellitus with hyperglycemia: Secondary | ICD-10-CM | POA: Diagnosis present

## 2012-06-30 DIAGNOSIS — S98139A Complete traumatic amputation of one unspecified lesser toe, initial encounter: Secondary | ICD-10-CM

## 2012-06-30 DIAGNOSIS — F3289 Other specified depressive episodes: Secondary | ICD-10-CM | POA: Diagnosis present

## 2012-06-30 DIAGNOSIS — Z792 Long term (current) use of antibiotics: Secondary | ICD-10-CM

## 2012-06-30 DIAGNOSIS — M869 Osteomyelitis, unspecified: Secondary | ICD-10-CM | POA: Diagnosis present

## 2012-06-30 DIAGNOSIS — IMO0002 Reserved for concepts with insufficient information to code with codable children: Secondary | ICD-10-CM | POA: Diagnosis present

## 2012-06-30 DIAGNOSIS — Z8739 Personal history of other diseases of the musculoskeletal system and connective tissue: Secondary | ICD-10-CM

## 2012-06-30 DIAGNOSIS — L03119 Cellulitis of unspecified part of limb: Secondary | ICD-10-CM | POA: Diagnosis present

## 2012-06-30 DIAGNOSIS — D638 Anemia in other chronic diseases classified elsewhere: Secondary | ICD-10-CM | POA: Diagnosis present

## 2012-06-30 DIAGNOSIS — M109 Gout, unspecified: Secondary | ICD-10-CM | POA: Diagnosis present

## 2012-06-30 DIAGNOSIS — R739 Hyperglycemia, unspecified: Secondary | ICD-10-CM

## 2012-06-30 DIAGNOSIS — Z794 Long term (current) use of insulin: Secondary | ICD-10-CM

## 2012-06-30 DIAGNOSIS — E1159 Type 2 diabetes mellitus with other circulatory complications: Principal | ICD-10-CM

## 2012-06-30 DIAGNOSIS — L0390 Cellulitis, unspecified: Secondary | ICD-10-CM

## 2012-06-30 DIAGNOSIS — L02619 Cutaneous abscess of unspecified foot: Secondary | ICD-10-CM | POA: Diagnosis present

## 2012-06-30 DIAGNOSIS — Z8544 Personal history of malignant neoplasm of other female genital organs: Secondary | ICD-10-CM

## 2012-06-30 HISTORY — DX: Pure hypercholesterolemia, unspecified: E78.00

## 2012-06-30 HISTORY — DX: Type 2 diabetes mellitus without complications: E11.9

## 2012-06-30 HISTORY — DX: Malignant neoplasm of vulva, unspecified: C51.9

## 2012-06-30 HISTORY — DX: Major depressive disorder, single episode, unspecified: F32.9

## 2012-06-30 HISTORY — DX: Type 2 diabetes mellitus with diabetic polyneuropathy: E11.42

## 2012-06-30 HISTORY — DX: Cellulitis, unspecified: L03.90

## 2012-06-30 HISTORY — DX: Essential (primary) hypertension: I10

## 2012-06-30 HISTORY — DX: Depression, unspecified: F32.A

## 2012-06-30 LAB — CBC
HCT: 29.8 % — ABNORMAL LOW (ref 36.0–46.0)
MCH: 27.7 pg (ref 26.0–34.0)
MCV: 87.9 fL (ref 78.0–100.0)
Platelets: 337 10*3/uL (ref 150–400)
RBC: 3.39 MIL/uL — ABNORMAL LOW (ref 3.87–5.11)
WBC: 14.9 10*3/uL — ABNORMAL HIGH (ref 4.0–10.5)

## 2012-06-30 LAB — CBC WITH DIFFERENTIAL/PLATELET
Basophils Absolute: 0 10*3/uL (ref 0.0–0.1)
HCT: 30.4 % — ABNORMAL LOW (ref 36.0–46.0)
Hemoglobin: 9.7 g/dL — ABNORMAL LOW (ref 12.0–15.0)
Lymphocytes Relative: 10 % — ABNORMAL LOW (ref 12–46)
Monocytes Absolute: 1.3 10*3/uL — ABNORMAL HIGH (ref 0.1–1.0)
Monocytes Relative: 8 % (ref 3–12)
Neutro Abs: 13 10*3/uL — ABNORMAL HIGH (ref 1.7–7.7)
WBC: 16 10*3/uL — ABNORMAL HIGH (ref 4.0–10.5)

## 2012-06-30 LAB — BASIC METABOLIC PANEL
BUN: 34 mg/dL — ABNORMAL HIGH (ref 6–23)
CO2: 21 mEq/L (ref 19–32)
Chloride: 99 mEq/L (ref 96–112)
Creatinine, Ser: 1.52 mg/dL — ABNORMAL HIGH (ref 0.50–1.10)
Potassium: 4.5 mEq/L (ref 3.5–5.1)

## 2012-06-30 LAB — GLUCOSE, CAPILLARY: Glucose-Capillary: 313 mg/dL — ABNORMAL HIGH (ref 70–99)

## 2012-06-30 LAB — CREATININE, SERUM: GFR calc Af Amer: 47 mL/min — ABNORMAL LOW (ref >90)

## 2012-06-30 MED ORDER — OXYCODONE HCL 5 MG PO TABS
5.0000 mg | ORAL_TABLET | ORAL | Status: DC | PRN
  Administered 2012-06-30 – 2012-07-05 (×3): 5 mg via ORAL
  Filled 2012-06-30 (×3): qty 1

## 2012-06-30 MED ORDER — ONDANSETRON HCL 4 MG PO TABS: 4.0000 mg | ORAL_TABLET | Freq: Four times a day (QID) | ORAL | Status: DC | PRN

## 2012-06-30 MED ORDER — FOLIC ACID 1 MG PO TABS
1.0000 mg | ORAL_TABLET | Freq: Every day | ORAL | Status: DC
  Administered 2012-07-01 – 2012-07-06 (×6): 1 mg via ORAL
  Filled 2012-06-30 (×7): qty 1

## 2012-06-30 MED ORDER — ACETAMINOPHEN 650 MG RE SUPP: 650.0000 mg | Freq: Four times a day (QID) | RECTAL | Status: DC | PRN

## 2012-06-30 MED ORDER — SODIUM CHLORIDE 0.9 % IV SOLN
3.0000 g | Freq: Once | INTRAVENOUS | Status: AC
  Administered 2012-06-30: 3 g via INTRAVENOUS
  Filled 2012-06-30: qty 3

## 2012-06-30 MED ORDER — ACETAMINOPHEN 325 MG PO TABS: 650.0000 mg | ORAL_TABLET | Freq: Four times a day (QID) | ORAL | Status: DC | PRN

## 2012-06-30 MED ORDER — FLUOXETINE HCL 20 MG PO CAPS
20.0000 mg | ORAL_CAPSULE | Freq: Every day | ORAL | Status: DC
  Administered 2012-06-30 – 2012-07-05 (×6): 20 mg via ORAL
  Filled 2012-06-30 (×7): qty 1

## 2012-06-30 MED ORDER — MORPHINE SULFATE 2 MG/ML IJ SOLN: 1.0000 mg | INTRAMUSCULAR | Status: DC | PRN

## 2012-06-30 MED ORDER — INSULIN GLARGINE 100 UNIT/ML ~~LOC~~ SOLN
10.0000 [IU] | Freq: Once | SUBCUTANEOUS | Status: AC
  Administered 2012-06-30: 10 [IU] via SUBCUTANEOUS
  Filled 2012-06-30: qty 1

## 2012-06-30 MED ORDER — ENALAPRIL MALEATE 5 MG PO TABS
5.0000 mg | ORAL_TABLET | Freq: Two times a day (BID) | ORAL | Status: DC
  Administered 2012-07-01 – 2012-07-06 (×11): 5 mg via ORAL
  Filled 2012-06-30 (×13): qty 1

## 2012-06-30 MED ORDER — ASPIRIN 81 MG PO TABS
81.0000 mg | ORAL_TABLET | Freq: Every day | ORAL | Status: DC
  Filled 2012-06-30 (×2): qty 1

## 2012-06-30 MED ORDER — INSULIN ASPART 100 UNIT/ML ~~LOC~~ SOLN
0.0000 [IU] | Freq: Three times a day (TID) | SUBCUTANEOUS | Status: DC
  Administered 2012-06-30: 15 [IU] via SUBCUTANEOUS
  Administered 2012-07-01 – 2012-07-03 (×2): 2 [IU] via SUBCUTANEOUS
  Administered 2012-07-03 – 2012-07-04 (×3): 3 [IU] via SUBCUTANEOUS
  Administered 2012-07-05: 5 [IU] via SUBCUTANEOUS
  Administered 2012-07-05: 8 [IU] via SUBCUTANEOUS
  Administered 2012-07-05: 11 [IU] via SUBCUTANEOUS
  Administered 2012-07-06: 2 [IU] via SUBCUTANEOUS

## 2012-06-30 MED ORDER — HYDROMORPHONE HCL PF 1 MG/ML IJ SOLN: 1.0000 mg | INTRAMUSCULAR | Status: AC | PRN

## 2012-06-30 MED ORDER — ENOXAPARIN SODIUM 40 MG/0.4ML ~~LOC~~ SOLN
40.0000 mg | SUBCUTANEOUS | Status: DC
  Administered 2012-06-30 – 2012-07-05 (×6): 40 mg via SUBCUTANEOUS
  Filled 2012-06-30 (×7): qty 0.4

## 2012-06-30 MED ORDER — SIMVASTATIN 40 MG PO TABS
40.0000 mg | ORAL_TABLET | Freq: Every evening | ORAL | Status: DC
  Administered 2012-06-30 – 2012-07-05 (×6): 40 mg via ORAL
  Filled 2012-06-30 (×7): qty 1

## 2012-06-30 MED ORDER — ONDANSETRON HCL 4 MG/2ML IJ SOLN: 4.0000 mg | Freq: Four times a day (QID) | INTRAMUSCULAR | Status: DC | PRN

## 2012-06-30 MED ORDER — INSULIN ASPART 100 UNIT/ML ~~LOC~~ SOLN
0.0000 [IU] | Freq: Every day | SUBCUTANEOUS | Status: DC
  Administered 2012-06-30: 3 [IU] via SUBCUTANEOUS
  Administered 2012-07-04: 4 [IU] via SUBCUTANEOUS
  Administered 2012-07-05: 2 [IU] via SUBCUTANEOUS

## 2012-06-30 MED ORDER — SODIUM CHLORIDE 0.9 % IV SOLN
INTRAVENOUS | Status: DC
  Administered 2012-06-30: 1000 mL via INTRAVENOUS
  Administered 2012-07-01 – 2012-07-04 (×3): via INTRAVENOUS

## 2012-06-30 MED ORDER — METFORMIN HCL 500 MG PO TABS
1000.0000 mg | ORAL_TABLET | Freq: Two times a day (BID) | ORAL | Status: DC
  Administered 2012-06-30 – 2012-07-03 (×7): 1000 mg via ORAL
  Filled 2012-06-30 (×10): qty 2

## 2012-06-30 MED ORDER — LEVOFLOXACIN IN D5W 500 MG/100ML IV SOLN
500.0000 mg | INTRAVENOUS | Status: DC
  Administered 2012-06-30 – 2012-07-05 (×6): 500 mg via INTRAVENOUS
  Filled 2012-06-30 (×7): qty 100

## 2012-06-30 MED ORDER — METOPROLOL SUCCINATE ER 50 MG PO TB24
50.0000 mg | ORAL_TABLET | Freq: Every day | ORAL | Status: DC
  Administered 2012-07-01 – 2012-07-06 (×6): 50 mg via ORAL
  Filled 2012-06-30 (×7): qty 1

## 2012-06-30 MED ORDER — TOPIRAMATE 25 MG PO TABS
50.0000 mg | ORAL_TABLET | Freq: Every day | ORAL | Status: DC
  Administered 2012-06-30 – 2012-07-05 (×6): 50 mg via ORAL
  Filled 2012-06-30 (×7): qty 2

## 2012-06-30 MED ORDER — OXYCODONE HCL 5 MG PO TABS: 5.0000 mg | ORAL_TABLET | ORAL | Status: DC | PRN

## 2012-06-30 MED ORDER — INSULIN GLARGINE 100 UNIT/ML ~~LOC~~ SOLN
64.0000 [IU] | Freq: Two times a day (BID) | SUBCUTANEOUS | Status: DC
  Administered 2012-06-30 – 2012-07-02 (×3): 64 [IU] via SUBCUTANEOUS

## 2012-06-30 MED ORDER — PANTOPRAZOLE SODIUM 40 MG PO TBEC
40.0000 mg | DELAYED_RELEASE_TABLET | Freq: Every day | ORAL | Status: DC
  Administered 2012-07-01 – 2012-07-06 (×6): 40 mg via ORAL
  Filled 2012-06-30 (×6): qty 1

## 2012-06-30 NOTE — ED Notes (Signed)
Swollen, redness, black blisters on big toe of rt. Foot.  Went to dr. Burgess Estelle and told she has gout.

## 2012-06-30 NOTE — ED Notes (Signed)
Pt had redness, swelling and warmth all the way up the right leg. S/P right second toe amputation. Large black blister at sight of amputation. Large ulcerated area foot.

## 2012-06-30 NOTE — ED Notes (Signed)
Attempted to call report. Will call back.  

## 2012-06-30 NOTE — Progress Notes (Signed)
Meghan [name] [id number] Admitted to 5507: 06/30/2012 3:58 PM Attending Provider: [name], MD    Meghan [name] is a 56 y.o. female patient admitted from ED awake, alert  & orientated  X 3,  No Order, VSS - Blood pressure 110/72, pulse 89, temperature 98.6 F (37 C), temperature source Oral, resp. rate 18, height 5\' 7"  (1.702 m), weight 95.7 kg (210 lb 15.7 oz), SpO2 99.00%., no c/o shortness of breath, no c/o chest pain, no distress noted.    IV site WDL:  forearm left, condition patent and no redness with a transparent dsg that's clean dry and intact.  Allergies:  No Known Allergies   Past Medical History  Diagnosis Date  . Diabetes mellitus without complication   . Cancer     to [place]    History:  obtained from the patient.  Pt orientation to unit, room and routine. Information packet given to patient/family and safety video watched.  Admission INP armband ID verified with patient/family, and in place. SR up x 2, fall risk assessment complete with Patient and family verbalizing understanding of risks associated with falls. Pt verbalizes an understanding of how to use the call bell and to call for help before getting out of bed.  Skin, clean-dry- intact without evidence of bruising, or skin tears.   Cellulitis  to right leg & feet, blood blister between great and 3 rd toe, & blister had busted to lateral aspect of foot with small. Amount of drainage.  Dr. Rito Ehrlich made aware of pt. Arrival, had already had seen pt.    Will cont to monitor and assist as needed.  Joana Reamer, RN 06/30/2012 3:58 PM

## 2012-06-30 NOTE — ED Provider Notes (Addendum)
History   This chart was scribed for Meghan Kras, MD by Charolett Bumpers, ER Scribe. The patient was seen in room TR06C/TR06C. Patient's care was started at 1139.  CSN: [id number] Arrival date & time 06/30/12  1125 First MD Initiated Contact with Patient 06/30/12 1139     CC: Foot pain  The history is provided by the patient. No language interpreter was used.   DANNIEL GRENZ is a 56 y.o. female who presents to the Emergency Department complaining of constant, moderate, worsening pain to her right foot and leg with associated redness, blisters and swelling. She states her symptoms started about 5 days ago. She denies any fevers, nausea, vomiting, diarrhea, SOB.   She reports she was sick prior to the onset of her symptoms She was seen by her PCP, dx with gout and given Prednisone with some relief. She denies any h/o similar symptoms. She has a h/o DM but reports her   Past Medical History  Diagnosis Date  . Diabetes mellitus without complication   . Cancer     to [facility]    Past Surgical History  Procedure Date  . 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 Lymphnodectomy   . Amputation     2nd toe    No family history on file.  History  Substance Use Topics  . Smoking status: Never Smoker   . Smokeless tobacco: Not on file  . Alcohol Use: No    OB History    Grav Para Term Preterm Abortions TAB SAB Ect Mult Living                  Review of Systems  Constitutional: Negative for fever and chills.  Respiratory: Negative for shortness of breath.   Gastrointestinal: Negative for nausea, vomiting and diarrhea.  Musculoskeletal: Positive for joint swelling and arthralgias.  Skin: Positive for color change.  Neurological: Negative for weakness.  All other systems reviewed and are negative.    Allergies  Review of patient's allergies indicates not on file.  Home Medications  No current outpatient prescriptions on file.  BP 143/62  Pulse 95  Temp 97.9  F (36.6 C)  Resp 16  SpO2 100%  Physical Exam  Nursing note and vitals reviewed. Constitutional: She appears well-developed and well-nourished. No distress.       Moderately overweight.   HENT:  Head: Normocephalic and atraumatic.  Right Ear: External ear normal.  Left Ear: External ear normal.  Eyes: Conjunctivae normal are normal. Right eye exhibits no discharge. Left eye exhibits no discharge. No scleral icterus.  Neck: Neck supple. No tracheal deviation present.  Cardiovascular: Normal rate, regular rhythm and intact distal pulses.        Strong DP pulse in right foot.   Pulmonary/Chest: Effort normal and breath sounds normal. No stridor. No respiratory distress. She has no wheezes. She has no rales.  Abdominal: Soft. Bowel sounds are normal. She exhibits no distension. There is no tenderness. There is no rebound and no guarding.  Musculoskeletal: She exhibits no edema and no tenderness.  Neurological: She is alert. She has normal strength. No sensory deficit. Cranial nerve deficit:  no gross defecits noted. She exhibits normal muscle tone. She displays no seizure activity. Coordination normal.  Skin: Skin is warm and dry. No rash noted.       Erythema of right foot extending proximally to calf. Edema to right calf and foot. Serosanguinous blister lesion between 1st and 3rd right  toes. Similar lesion adjacent to right metatarsal joint. Absent 2nd toe.   Psychiatric: She has a normal mood and affect.    ED Course  Procedures (including critical care time)  DIAGNOSTIC STUDIES: Oxygen Saturation is 100% on room air, normal by my interpretation.    COORDINATION OF CARE:  11:50-Discussed planned course of treatment with the patient, who is agreeable at this time.     Labs Reviewed  CBC WITH DIFFERENTIAL - Abnormal; Notable for the following:    WBC 16.0 (*)     RBC 3.47 (*)     Hemoglobin 9.7 (*)     HCT 30.4 (*)     Neutrophils Relative 81 (*)     Neutro Abs 13.0 (*)       Lymphocytes Relative 10 (*)     Monocytes Absolute 1.3 (*)     All other components within normal limits  BASIC METABOLIC PANEL - Abnormal; Notable for the following:    Sodium 134 (*)     Glucose, Bld 370 (*)     BUN 34 (*)     Creatinine, Ser 1.52 (*)     GFR calc non Af Amer 37 (*)     GFR calc Af Amer 43 (*)     All other components within normal limits   Dg Foot Complete Right  06/30/2012  *RADIOLOGY REPORT*  Clinical Data: Cellulitis of medial great toe  RIGHT FOOT COMPLETE - 3+ VIEW  Comparison: None.  Findings: The patient is status post second toe amputation at the base of the proximal phalanx.  No evidence for bony lysis in the great toe to suggest osteomyelitis.  There is no evidence for destructive changes in the remaining metatarsals or phalanges.  IMPRESSION: No radiographic evidence for osteomyelitis.   Original Report Authenticated By: Kennith Center, M.D.       MDM  Concerning for cellulitis in a diabetic patient.   Doubt gout.  Symptoms involve soft tissues areas and not just the joints.  Blisters now noted.  May end up requiring debridement.  IV unasyn ordered.  Sliding scale insulin given.  Hyperglycemia likely related to her infection as well as current steroid use.  I personally performed the services described in this documentation, which was scribed in my presence. The recorded information has been reviewed and is accurate.     Meghan Kras, MD 06/30/12 [phone]

## 2012-06-30 NOTE — H&P (Signed)
[facility] History and Physical  Meghan [name] ZOX:[id number] DOB: 1955/12/25 DOA: 06/30/2012  Referring physician: Jola Baptist, ER Physician PCP: Ignatius Specking., MD  Specialists: None  Chief Complaint: Swollen and red leg  HPI: Meghan [name] is a 56 y.o. female  With past medical history of diabetes, hypertension and status post removal of second toe on right foot from diabetic complications who has had several days of increasing erythema, swelling and drainage from a blistering wound around her right great toe. She went to her primary care physician's office and was evaluated, but this was felt to be more related to gallop and she was started on steroids. She is also on Bactrim for a UTI. In the meantime, patient continued or worsening erythema that started going up her leg along with increased swelling so she came to the emergency room. Dementia she was found have a white count of 16,000 and evaluation from the ER physician that this was cellulitis. An x-ray was done which showed no signs of any osteomyelitis. She's given a dose of Unasyn in the emergency room and hospitalists were called for further evaluation and admission.  Review of Systems: I saw the patient emergency room, she was doing okay. She complains of mild pain in her right lower extremity. She denied any headaches, vision changes, dysphasia, chest pain, palpitations, shortness of breath, wheeze, cough, abdominal pain, hematuria, dysuria, constipation, diarrhea, focal extremity numbness or weakness or pain other than described above. Her main complaint is of swelling and tenderness in her right lower sternum he mostly in the leg but also in the foot. She's also having complains of some drainage in the wound. Review systems otherwise negative.  Past Medical History  Diagnosis Date  . Diabetes mellitus without complication   . Cancer     to [place]   hypertension History of gout  Past Surgical History    Procedure Date  . 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 Lymphnodectomy   . Amputation     2nd toe   Social History:  reports that she has never smoked. She does not have any smokeless tobacco history on file. She reports that she does not drink alcohol or use illicit drugs. Patient was at home with her husband. She is normally able to participate in activities of daily living without too much assistance  No Known Allergies  Family history: Patient does not know of any family history of medical problems. She is adopted.  Prior to Admission medications   Medication Sig Start Date End Date Taking? Authorizing Provider  aspirin 81 MG tablet Take 81 mg by mouth daily.   Yes Historical Provider, MD  enalapril (VASOTEC) 5 MG tablet Take 5 mg by mouth 2 (two) times daily.   Yes Historical Provider, MD  FLUoxetine (PROZAC) 20 MG capsule Take 20 mg by mouth at bedtime.   Yes Historical Provider, MD  folic acid (FOLVITE) 1 MG tablet Take 1 mg by mouth daily.   Yes Historical Provider, MD  insulin glargine (LANTUS) 100 UNIT/ML injection Inject 64-84 Units into the skin 2 (two) times daily. Takes 64 units in the mornings and 84 units at night   Yes Historical Provider, MD  metFORMIN (GLUCOPHAGE) 1000 MG tablet Take 1,000 mg by mouth 2 (two) times daily with a meal.   Yes Historical Provider, MD  metoprolol succinate (TOPROL-XL) 50 MG 24 hr tablet Take 50 mg by mouth daily. Take with or immediately following a meal.  Yes Historical Provider, MD  pantoprazole (PROTONIX) 40 MG tablet Take 40 mg by mouth daily.   Yes Historical Provider, MD  prednisoLONE 5 MG TABS Take 5-50 mg by mouth 2 (two) times daily. 6 day dose pack started 06-29-12 for gout  Take 50 mg 1st day, 40 mg 2nd day, then 30 mg 3rd day, 20 mg 4 th day, 10 mg 5 th day and 5 mg the last day.   Yes Historical Provider, MD  simvastatin (ZOCOR) 40 MG tablet Take 40 mg by mouth every evening.   Yes Historical Provider, MD   sulfamethoxazole-trimethoprim (BACTRIM DS) 800-160 MG per tablet Take 1 tablet by mouth 2 (two) times daily. 10 day dose started 06-29-12 for UTI   Yes Historical Provider, MD  topiramate (TOPAMAX) 50 MG tablet Take 50 mg by mouth at bedtime.   Yes Historical Provider, MD   Physical Exam: Filed Vitals:   06/30/12 1128 06/30/12 1504 06/30/12 1550  BP: 143/62 110/60 110/72  Pulse: 95 88 89  Temp: 97.9 F (36.6 C) 98.2 F (36.8 C) 98.6 F (37 C)  TempSrc:   Oral  Resp: 16 18 18   Height:   5\' 7"  (1.702 m)  Weight:   95.7 kg (210 lb 15.7 oz)  SpO2: 100% 98% 99%     General:  Alert and oriented x3, no acute distress, fatigued, looks younger than stated age  Eyes: Sclerae nonicteric, extraocular movements are intact  ENT: Normocephalic, atraumatic, mucous membranes are slightly dry  Neck: Supple, no JVD  Cardiovascular: Regular rate and rhythm, S1-S2  Respiratory: Clear to auscultation bilaterally  Abdomen: Soft, nontender, nondistended, positive bowel sounds  Skin: Patient's left lower extremity from approximately mid way down on her leg to her foot is significant erythema with some 1+ edema mostly on the anterior aspect. Orders have been drawn. In addition over her right foot, she is a blackened eschar where her second toe was removed which is stable. She has a 1.5-2 cm circular lesion to the medial aspect of her first toe which looks to be a blistering lesion with some minimal drainage from this. It is mildly tender. Patient has full sensation otherwise. Is also surrounding cellulitis over her right foot.  Musculoskeletal: No clubbing or cyanosis, mild edema right greater than left  Psychiatric: Patient is appropriate, no evidence of psychoses  Neurologic: No focal signs or deficits. Patient has no neuropathy from her diabetes  Labs on Admission:  Basic Metabolic Panel:  Lab 06/30/12 8657  NA 134*  K 4.5  CL 99  CO2 21  GLUCOSE 370*  BUN 34*  CREATININE 1.52*   CALCIUM 9.4  MG --  PHOS --   CBC:  Lab 06/30/12 1211  WBC 16.0*  NEUTROABS 13.0*  HGB 9.7*  HCT 30.4*  MCV 87.6  PLT 325   CBG:  Lab 06/30/12 1548  GLUCAP 313*    Radiological Exams on Admission: Dg Foot Complete Right  06/30/2012 .  IMPRESSION: No radiographic evidence for osteomyelitis.   Original Report Authenticated By: Kennith Center, M.D.     Assessment/Plan Principal Problem:  *Cellulitis of leg: No evidence of osteomyelitis at this time. Will treat with IV Levaquin for now but also awaiting wound cultures. Have drawn borders on cellulitis to see if this is improving Active Problems:  History of gout: Stable medical issue. No evidence of acute attack. Discontinuing steroids because it's only causing hyperglycemia  DM (diabetes mellitus), type 2, uncontrolled, periph vascular complic: Sliding scale plus home  Lantus plus metformin  HTN (hypertension): Continue home meds  ARF (acute renal failure): Unsure if there is any underlying chronic kidney disease. Hydrating the meantime.   Code Status: full code  Family Communication:  discussed with patient and husband at bedside.  Disposition Plan: Here for likely the next one to 2 days, will go home after that   Time spent:  35 minutes   [name] [facility] Pager [phone]  If 7PM-7AM, please contact night-coverage [email or web address] Password [email or web address] 06/30/2012, 3:55 PM

## 2012-07-01 LAB — GLUCOSE, CAPILLARY
Glucose-Capillary: 89 mg/dL (ref 70–99)
Glucose-Capillary: 83 mg/dL (ref 70–99)

## 2012-07-01 LAB — COMPREHENSIVE METABOLIC PANEL
ALT: 11 U/L (ref 0–35)
AST: 12 U/L (ref 0–37)
Alkaline Phosphatase: 80 U/L (ref 39–117)
CO2: 20 mEq/L (ref 19–32)
Chloride: 104 mEq/L (ref 96–112)
GFR calc Af Amer: 52 mL/min — ABNORMAL LOW (ref >90)
GFR calc non Af Amer: 45 mL/min — ABNORMAL LOW (ref >90)
Glucose, Bld: 79 mg/dL (ref 70–99)
Potassium: 4.7 mEq/L (ref 3.5–5.1)
Sodium: 137 mEq/L (ref 135–145)
Total Bilirubin: 0.1 mg/dL — ABNORMAL LOW (ref 0.3–1.2)

## 2012-07-01 MED ORDER — ASPIRIN 81 MG PO CHEW
81.0000 mg | CHEWABLE_TABLET | Freq: Every day | ORAL | Status: DC
  Administered 2012-07-01 – 2012-07-06 (×6): 81 mg via ORAL
  Filled 2012-07-01 (×6): qty 1

## 2012-07-01 NOTE — Progress Notes (Signed)
[facility] PROGRESS NOTE  Meghan [name] ZOX:[id number] DOB: 03-Sep-1955 DOA: 06/30/2012 PCP: Ignatius Specking., MD  Assessment/Plan: Principal Problem:  *Cellulitis of leg/foot.: We'll get wound care consult for foot. In the meantime, continue IV antibiotics times one more day and then can change to by mouth and discharge home. Responded well. White count coming down.  Active Problems:  History of gout: Stable. No active flare.   DM (diabetes mellitus), type 2, uncontrolled, periph vascular complic: Continue patient's steroids. Continue home medications plus sliding scale. Monitor closely, patient's blood sugar dipped a little low this morning   HTN (hypertension): Blood pressure excellent   ARF (acute renal failure): Improving with gentle IV fluids.  Code Status: full Family Communication: Plan discussed with patient and husband not present  Disposition Plan: Home likely tomorrow   Consultants:  None  Procedures:  None  Antibiotics:  IV Levaquin day 2  HPI/Subjective: Patient feeling better today. Less foot pain. Did not sleep well, but otherwise doing okay.  Objective: Filed Vitals:   06/30/12 1550 06/30/12 2052 06/30/12 2227 07/01/12 0536  BP: 110/72 96/61 110/59 115/73  Pulse: 89 83 80 76  Temp: 98.6 F (37 C) 98 F (36.7 C)  98.2 F (36.8 C)  TempSrc: Oral Oral  Oral  Resp: 18 18 18 20   Height: 5\' 7"  (1.702 m)     Weight: 95.7 kg (210 lb 15.7 oz)     SpO2: 99% 96% 95% 97%    Intake/Output Summary (Last 24 hours) at 07/01/12 1230 Last data filed at 07/01/12 0900  Gross per 24 hour  Intake  987.5 ml  Output      2 ml  Net  985.5 ml   Filed Weights   06/30/12 1550  Weight: 95.7 kg (210 lb 15.7 oz)    Exam:   General:  Alert and oriented x3, no acute distress, fatigued  Cardiovascular: Regular rate and rhythm, S1-S2  Respiratory: Clear to auscultation bilaterally  Abdomen: Soft, nontender, nondistended, positive bowel  sounds  Extremities: Right foot/leg: Decreasing erythema. Toe ulcer has been packed and wrapped. Less drainage.  Data Reviewed: Basic Metabolic Panel:  Lab 07/01/12 8119 06/30/12 1827 06/30/12 1211  NA 137 -- 134*  K 4.7 -- 4.5  CL 104 -- 99  CO2 20 -- 21  GLUCOSE 79 -- 370*  BUN 30* -- 34*  CREATININE 1.30* 1.42* 1.52*  CALCIUM 9.2 -- 9.4  MG -- -- --  PHOS -- -- --   Liver Function Tests:  Lab 07/01/12 0600  AST 12  ALT 11  ALKPHOS 80  BILITOT 0.1*  PROT 7.5  ALBUMIN 2.7*   CBC:  Lab 06/30/12 1827 06/30/12 1211  WBC 14.9* 16.0*  NEUTROABS -- 13.0*  HGB 9.4* 9.7*  HCT 29.8* 30.4*  MCV 87.9 87.6  PLT 337 325   CBG:  Lab 07/01/12 0747 06/30/12 2227 06/30/12 1710 06/30/12 1548  GLUCAP 71 260* 367* 313*    Recent Results (from the past 240 hour(s))  WOUND CULTURE     Status: Normal (Preliminary result)   Collection Time   06/30/12  7:27 PM      Component Value Range Status Comment   Specimen Description WOUND TOE   Final    Special Requests NONE   Final    Gram Stain     Final    Value: RARE WBC PRESENT, PREDOMINANTLY MONONUCLEAR     RARE SQUAMOUS EPITHELIAL CELLS PRESENT     FEW GRAM POSITIVE COCCI IN CHAINS  IN PAIRS   Culture NO GROWTH 1 DAY   Final    Report Status PENDING   Incomplete      Studies: Dg Foot Complete Right  07/22/12   IMPRESSION: No radiographic evidence for osteomyelitis.   Original Report Authenticated By: Kennith Center, M.D.     Scheduled Meds:   . [COMPLETED] ampicillin-sulbactam (UNASYN) IV  3 g Intravenous Once  . aspirin  81 mg Oral Daily  . enalapril  5 mg Oral BID  . enoxaparin (LOVENOX) injection  40 mg Subcutaneous Q24H  . FLUoxetine  20 mg Oral QHS  . folic acid  1 mg Oral Daily  . insulin aspart  0-15 Units Subcutaneous TID WC  . insulin aspart  0-5 Units Subcutaneous QHS  . [COMPLETED] insulin glargine  10 Units Subcutaneous Once  . insulin glargine  64-84 Units Subcutaneous BID  . levofloxacin  (LEVAQUIN) IV  500 mg Intravenous Q24H  . metFORMIN  1,000 mg Oral BID WC  . metoprolol succinate  50 mg Oral Daily  . pantoprazole  40 mg Oral Daily  . simvastatin  40 mg Oral QPM  . topiramate  50 mg Oral QHS  . [DISCONTINUED] aspirin  81 mg Oral Daily   Continuous Infusions:   . sodium chloride 50 mL/hr at 07/01/12 0753        Time spent: 20 min    [name]  [facility] Pager [phone]. If 8PM-8AM, please contact night-coverage at [email or web address], password [facility] 07/01/2012, 12:30 PM  LOS: 1 day

## 2012-07-01 NOTE — Progress Notes (Signed)
Nutrition Brief Note  Patient identified on the Malnutrition Screening Tool (MST) Report  Pt reports weight loss related to fluid. Pt reports noncompliance with DM diet but states that she knows what to do but has a hard time complying. Pt declines DM education at this time.   Body mass index is 33.04 kg/(m^2). Pt meets criteria for Obesity Class I based on current BMI.   Current diet order is CHO Modified Medium , patient is consuming approximately 100% of meals at this time. Labs and medications reviewed.  Lab Results  Component Value Date   HGBA1C  Value: 8.5 (NOTE)   The ADA recommends the following therapeutic goals for glycemic   control related to Hgb A1C measurement:   Goal of Therapy:   < 7.0% Hgb A1C   Action Suggested:  > 8.0% Hgb A1C   Ref:  Diabetes Care, 22, Suppl. 1, 1999* 04/01/2007     No nutrition interventions warranted at this time. If nutrition issues arise, please consult RD.   Kendell Bane RD, LDN, CNSC [phone] Weekend/After Hours Pager

## 2012-07-02 ENCOUNTER — Observation Stay (HOSPITAL_COMMUNITY): Payer: BC Managed Care – PPO

## 2012-07-02 LAB — GLUCOSE, CAPILLARY
Glucose-Capillary: 73 mg/dL (ref 70–99)
Glucose-Capillary: 102 mg/dL — ABNORMAL HIGH (ref 70–99)
Glucose-Capillary: 156 mg/dL — ABNORMAL HIGH (ref 70–99)

## 2012-07-02 LAB — WOUND CULTURE

## 2012-07-02 LAB — CBC
HCT: 31.2 % — ABNORMAL LOW (ref 36.0–46.0)
MCHC: 31.7 g/dL (ref 30.0–36.0)
Platelets: 375 10*3/uL (ref 150–400)
RDW: 15.3 % (ref 11.5–15.5)
WBC: 14.3 10*3/uL — ABNORMAL HIGH (ref 4.0–10.5)

## 2012-07-02 MED ORDER — INSULIN GLARGINE 100 UNIT/ML ~~LOC~~ SOLN: 32.0000 [IU] | Freq: Two times a day (BID) | SUBCUTANEOUS | Status: DC

## 2012-07-02 MED ORDER — INSULIN GLARGINE 100 UNIT/ML ~~LOC~~ SOLN
42.0000 [IU] | Freq: Every day | SUBCUTANEOUS | Status: DC
  Administered 2012-07-02: 42 [IU] via SUBCUTANEOUS

## 2012-07-02 MED ORDER — INSULIN GLARGINE 100 UNIT/ML ~~LOC~~ SOLN: 32.0000 [IU] | Freq: Every day | SUBCUTANEOUS | Status: DC

## 2012-07-02 NOTE — Progress Notes (Signed)
CBG at 2200 was 52mg /dl. A cup of orange juice given and followed up latter produced BS of 82mg /dl. No signs and symptom of hypoglycemic episode noted at this time.Patient alert and orientedx3. We will continue to monitor patient.

## 2012-07-02 NOTE — Consult Note (Signed)
WOC consult Note Reason for Consult:RGT intact blister.  Patient is s/p amputation of 2nd toe on right foot and is adm for treatment of cellulitis on RLE. Patient has DM. Wound type:infectious Pressure Ulcer POA: No Measurement: intact, yellow fluid-filled blister that measures 2.5cm and 9cm presents nearly circumferentially around base of right great toe. Wound bed:not evident Drainage (amount, consistency, odor) none Periwound:erythematous and with warmth.  Patient complains of pain at the tip of this digit. Dressing procedure/placement/frequency:I will suggest consultation with ortho MD for evaluation of this digit.  Patient's spouse is treated by Dr. Aldean Baker (and has also sustained an amputation of the toe).If you agree, please order.  In the meantime I suggest a twice daily saline dressing in an attempt to draw the entrapped fluid. I will not follow.  Please re-consult if needed. Thanks, Ladona Mow, MSN, RN, [facility], CWOCN [phone])

## 2012-07-02 NOTE — Progress Notes (Signed)
[facility] PROGRESS NOTE  Meghan [name] ZOX:[id number] DOB: 09/07/1955 DOA: 06/30/2012 PCP: Ignatius Specking., MD  Assessment/Plan: Principal Problem:  *Cellulitis of leg/foot.: Leg is great we will improved on IV Levaquin, but white blood cell count started to level off at 14. In addition wound care saw patient for draining wound and is concerned about abscess versus osteo-. They have recommended consulting orthopedic surgery. Weeks call and plan is for MRI. Orthopedics will see in the morning. Given the improvement in leg and likely strep species, I do not feel that antibiotic change is warranted at this time. No evidence of any staff.  Active Problems:  History of gout: Stable. No active flare.   DM (diabetes mellitus), type 2, uncontrolled, periph vascular complic: Started to have some hypoglycemia. Suspect that with her underlying foot ulcer, she's been having higher and higher blood sugars leading to increases in her insulin. Will decrease dose by half   HTN (hypertension): Blood pressure excellent   ARF (acute renal failure): Continues to improve with gentle IV fluids.  Code Status: full Family Communication: Plan discussed with patient and husband who is present  Disposition Plan: Home likely with home health physical therapy   Consultants:   Dr. Cleophas Dunker, orthopedic surgery on call for Dr.Duda  Procedures:  None  Antibiotics:  IV Levaquin day 3  HPI/Subjective: Patient feeling better today. Less foot pain. Did not sleep well, but otherwise doing okay.  Objective: Filed Vitals:   07/01/12 0536 07/01/12 1337 07/01/12 2157 07/02/12 0541  BP: 115/73 117/66 109/69 96/62  Pulse: 76 83 80 77  Temp: 98.2 F (36.8 C) 98.6 F (37 C) 98.5 F (36.9 C) 97.5 F (36.4 C)  TempSrc: Oral Oral Oral Oral  Resp: 20  17 17   Height:      Weight:      SpO2: 97% 98% 98% 95%    Intake/Output Summary (Last 24 hours) at 07/02/12 1421 Last data filed at 07/02/12  0900  Gross per 24 hour  Intake 1512.5 ml  Output      0 ml  Net 1512.5 ml   Filed Weights   06/30/12 1550  Weight: 95.7 kg (210 lb 15.7 oz)    Exam:   General:  Alert and oriented x3, no acute distress, fatigued  Cardiovascular: Regular rate and rhythm, S1-S2  Respiratory: Clear to auscultation bilaterally  Abdomen: Soft, nontender, nondistended, positive bowel sounds  Extremities: Right foot/leg: Erythema on leg mostly resolved Toe ulcer still with some purulent drainage Less drainage. +1 edema  Data Reviewed: Basic Metabolic Panel:  Lab 07/01/12 8119 06/30/12 1827 06/30/12 1211  NA 137 -- 134*  K 4.7 -- 4.5  CL 104 -- 99  CO2 20 -- 21  GLUCOSE 79 -- 370*  BUN 30* -- 34*  CREATININE 1.30* 1.42* 1.52*  CALCIUM 9.2 -- 9.4  MG -- -- --  PHOS -- -- --   Liver Function Tests:  Lab 07/01/12 0600  AST 12  ALT 11  ALKPHOS 80  BILITOT 0.1*  PROT 7.5  ALBUMIN 2.7*   CBC:  Lab 07/02/12 1110 06/30/12 1827 06/30/12 1211  WBC 14.3* 14.9* 16.0*  NEUTROABS -- -- 13.0*  HGB 9.9* 9.4* 9.7*  HCT 31.2* 29.8* 30.4*  MCV 87.6 87.9 87.6  PLT 375 337 325   CBG:  Lab 07/02/12 1216 07/02/12 0823 07/02/12 0752 07/01/12 2229 07/01/12 2157  GLUCAP 90 73 60* 83 52*    Recent Results (from the past 240 hour(s))  WOUND CULTURE  Status: Normal (Preliminary result)   Collection Time   21-Jul-2012  7:27 PM      Component Value Range Status Comment   Specimen Description WOUND TOE   Final    Special Requests NONE   Final    Gram Stain     Final    Value: RARE WBC PRESENT, PREDOMINANTLY MONONUCLEAR     RARE SQUAMOUS EPITHELIAL CELLS PRESENT     FEW GRAM POSITIVE COCCI IN CHAINS     IN PAIRS   Culture NO GROWTH 1 DAY   Final    Report Status PENDING   Incomplete      Studies: Dg Foot Complete Right  July 21, 2012   IMPRESSION: No radiographic evidence for osteomyelitis.   Original Report Authenticated By: Kennith Center, M.D.     Scheduled Meds:    . aspirin  81 mg  Oral Daily  . enalapril  5 mg Oral BID  . enoxaparin (LOVENOX) injection  40 mg Subcutaneous Q24H  . FLUoxetine  20 mg Oral QHS  . folic acid  1 mg Oral Daily  . insulin aspart  0-15 Units Subcutaneous TID WC  . insulin aspart  0-5 Units Subcutaneous QHS  . insulin glargine  64-84 Units Subcutaneous BID  . levofloxacin (LEVAQUIN) IV  500 mg Intravenous Q24H  . metFORMIN  1,000 mg Oral BID WC  . metoprolol succinate  50 mg Oral Daily  . pantoprazole  40 mg Oral Daily  . simvastatin  40 mg Oral QPM  . topiramate  50 mg Oral QHS   Continuous Infusions:    . sodium chloride 50 mL/hr at 07/02/12 0802        Time spent: 25 min    [name]  [facility] Pager [phone]. If 8PM-8AM, please contact night-coverage at [email or web address], password [facility] 07/02/2012, 2:21 PM  LOS: 2 days

## 2012-07-03 ENCOUNTER — Encounter (HOSPITAL_COMMUNITY)
Admission: EM | Disposition: A | Discharge status: Home Health | Payer: Self-pay | Source: Home / Self Care | Attending: Internal Medicine

## 2012-07-03 DIAGNOSIS — L03119 Cellulitis of unspecified part of limb: Secondary | ICD-10-CM

## 2012-07-03 DIAGNOSIS — L02419 Cutaneous abscess of limb, unspecified: Secondary | ICD-10-CM

## 2012-07-03 LAB — GLUCOSE, CAPILLARY: Glucose-Capillary: 124 mg/dL — ABNORMAL HIGH (ref 70–99)

## 2012-07-03 LAB — SURGICAL PCR SCREEN: MRSA, PCR: NEGATIVE

## 2012-07-03 SURGERY — AMPUTATION DIGIT
Anesthesia: General | Site: Foot | Laterality: Right

## 2012-07-03 MED ORDER — GADOBENATE DIMEGLUMINE 529 MG/ML IV SOLN
20.0000 mL | Freq: Once | INTRAVENOUS | Status: AC
  Administered 2012-07-02: 20 mL via INTRAVENOUS

## 2012-07-03 MED ORDER — INSULIN GLARGINE 100 UNIT/ML ~~LOC~~ SOLN
40.0000 [IU] | Freq: Every day | SUBCUTANEOUS | Status: DC
Start: 2012-07-03 — End: 2012-07-05
  Administered 2012-07-03 – 2012-07-04 (×2): 40 [IU] via SUBCUTANEOUS

## 2012-07-03 MED ORDER — INSULIN GLARGINE 100 UNIT/ML ~~LOC~~ SOLN
27.0000 [IU] | Freq: Every day | SUBCUTANEOUS | Status: DC
  Administered 2012-07-04 – 2012-07-05 (×2): 27 [IU] via SUBCUTANEOUS

## 2012-07-03 NOTE — Consult Note (Signed)
Reason for Consult:osteomyelitis and abscess right great toe Referring Physician: [name] is an 56 y.o. female.  WGN:FAOZH cellulitis and abscess right great toe, initially went to ER Wednesday, diagnosed as gout.  Past Medical History  Diagnosis Date  . Vulvar cancer 2007  . Hypertension   . High cholesterol   . Type II diabetes mellitus   . Diabetic peripheral neuropathy   . Cellulitis 06/30/2012    RLE  . Depression     Past Surgical History  Procedure Date  . Amputation ~ 2008    "2nd toe of right foot" (06/30/2012)  . Incision and drainage of wound 03/2007    "mediastinitis", left medial collarbone (06/30/2012)  . Lymphadenectomy 2007    "bilateral groins; had cancer vulva" (06/30/2012)  . Vulva surgery 2007    "took cancer off" (06/30/2012)    History reviewed. No pertinent family history.  Social History:  reports that she has quit smoking. Her smoking use included Cigarettes. She has a 24 pack-year smoking history. She has never used smokeless tobacco. She reports that she drinks alcohol. She reports that she does not use illicit drugs.  Allergies: No Known Allergies  Medications: I have reviewed  Results for orders placed during the hospital encounter of 06/30/12 (from the past 48 hour(s))  GLUCOSE, CAPILLARY     Status: Normal   Collection Time   07/01/12  7:47 AM      Component Value Range Comment   Glucose-Capillary 71  70 - 99 mg/dL   GLUCOSE, CAPILLARY     Status: Normal   Collection Time   07/01/12 12:26 PM      Component Value Range Comment   Glucose-Capillary 89  70 - 99 mg/dL   GLUCOSE, CAPILLARY     Status: Abnormal   Collection Time   07/01/12  5:08 PM      Component Value Range Comment   Glucose-Capillary 134 (*) 70 - 99 mg/dL   GLUCOSE, CAPILLARY     Status: Abnormal   Collection Time   07/01/12  9:57 PM      Component Value Range Comment   Glucose-Capillary 52 (*) 70 - 99 mg/dL    Comment 1 Notify RN       GLUCOSE, CAPILLARY     Status: Normal   Collection Time   07/01/12 10:29 PM      Component Value Range Comment   Glucose-Capillary 83  70 - 99 mg/dL    Comment 1 Notify RN     GLUCOSE, CAPILLARY     Status: Abnormal   Collection Time   07/02/12  7:52 AM      Component Value Range Comment   Glucose-Capillary 60 (*) 70 - 99 mg/dL   GLUCOSE, CAPILLARY     Status: Normal   Collection Time   07/02/12  8:23 AM      Component Value Range Comment   Glucose-Capillary 73  70 - 99 mg/dL   CBC     Status: Abnormal   Collection Time   07/02/12 11:10 AM      Component Value Range Comment   WBC 14.3 (*) 4.0 - 10.5 K/uL    RBC 3.56 (*) 3.87 - 5.11 MIL/uL    Hemoglobin 9.9 (*) 12.0 - 15.0 g/dL    HCT 08.6 (*) 57.8 - 46.0 %    MCV 87.6  78.0 - 100.0 fL    MCH 27.8  26.0 - 34.0 pg    MCHC 31.7  30.0 -  36.0 g/dL    RDW 53.6  64.4 - 03.4 %    Platelets 375  150 - 400 K/uL   GLUCOSE, CAPILLARY     Status: Normal   Collection Time   07/02/12 12:16 PM      Component Value Range Comment   Glucose-Capillary 90  70 - 99 mg/dL   GLUCOSE, CAPILLARY     Status: Abnormal   Collection Time   07/02/12  6:31 PM      Component Value Range Comment   Glucose-Capillary 102 (*) 70 - 99 mg/dL    Comment 1 Notify RN     GLUCOSE, CAPILLARY     Status: Abnormal   Collection Time   07/02/12  9:11 PM      Component Value Range Comment   Glucose-Capillary 156 (*) 70 - 99 mg/dL    Comment 1 Notify RN       No results found.  Review of Systems  All other systems reviewed and are negative.   Blood pressure 107/67, pulse 80, temperature 98.2 F (36.8 C), temperature source Oral, resp. rate 17, height 5\' 7"  (1.702 m), weight 95.7 kg (210 lb 15.7 oz), SpO2 96.00%. Physical Exam Good DP and PT pulse, with cellulitis to mid tibia, and abscess great toe MTP, s/p 2nd toe amputation Assessment/Plan: Abscess, osteomyelitis right great toe MTP. Plan for amputation of great toe possible transmetatarsal amputation.   Will schedule as add on surgery today or tomorrow  Meghan [name] 07/03/2012, 7:39 AM

## 2012-07-03 NOTE — Progress Notes (Signed)
Patient ID: Meghan [name], female   DOB: [birth year], 56 y.o.   MRN: [id number]  [facility] PROGRESS NOTE  Meghan [name]:[id number] DOB: 30-Mar-1956 DOA: 06/30/2012 PCP: Ignatius Specking., MD  Brief narrative: Pt is a 56 y.o. female with past medical history of diabetes, hypertension and status post removal of second toe on right foot from diabetic complications who has had several days of increasing erythema, swelling and drainage from a blistering wound around her right great toe. She went to her primary care physician's office and was evaluated, but this was felt to be more related to gout and she was started on steroids. She was also on Bactrim for a UTI. In the meantime, patient continued with worsening erythema that started going up her leg along with increased swelling so she came to the emergency room.In ED, she was found have a white count of 16,000 and evaluation from the ER physician was requested for evaluation and management of cellulitis.   Principal Problem:  *Cellulitis of leg - appreciate ortho input - plan is for amputation of the right great toe in AM - continue Levaquin day #4 Active Problems:  History of gout - stable  DM (diabetes mellitus), type 2, uncontrolled, periph vascular complications - dose of insulin was increased - better controlled at this point and with even mild hypoglycemia - will decrease the dose of Lantus to 27 units for tomorrow   HTN (hypertension) - stable   ARF (acute renal failure) - creatinine is trending down - BMP in AM  Consultants:  Dr. Cleophas Dunker, orthopedic surgery on call for Dr.Duda Procedures/Studies:  Mr Foot Right W Wo Contrast 07/03/2012 --> Cellulitis and skin ulceration with underlying myositis, No findings for septic arthritis, osteomyelitis, focal soft tissue absces Antibiotics:  IV Levaquin day 4  Code Status: Full Family Communication: Pt at bedside Disposition Plan: Home when medically  stable  HPI/Subjective: No events overnight. Pt reports feeling better this AM.  Objective: Filed Vitals:   07/03/12 0443 07/03/12 1058 07/03/12 1059 07/03/12 1356  BP: 107/67 101/51 101/51 119/76  Pulse: 80 84  89  Temp: 98.2 F (36.8 C)   98.7 F (37.1 C)  TempSrc: Oral   Oral  Resp: 17   18  Height:      Weight:      SpO2: 96%   96%    Intake/Output Summary (Last 24 hours) at 07/03/12 1406 Last data filed at 07/03/12 0700  Gross per 24 hour  Intake  497.5 ml  Output      0 ml  Net  497.5 ml    Exam:  General: Alert and oriented x3, no acute distress, fatigued  Cardiovascular: Regular rate and rhythm, S1-S2  Respiratory: Clear to auscultation bilaterally  Abdomen: Soft, nontender, nondistended, positive bowel sounds  Extremities: Right foot/leg: Erythema on leg mostly resolved Toe ulcer still with some purulent drainage Less drainage. +1 edema  Data Reviewed: Basic Metabolic Panel:  Lab 07/01/12 9562 06/30/12 1827 06/30/12 1211  NA 137 -- 134*  K 4.7 -- 4.5  CL 104 -- 99  CO2 20 -- 21  GLUCOSE 79 -- 370*  BUN 30* -- 34*  CREATININE 1.30* 1.42* 1.52*  CALCIUM 9.2 -- 9.4  MG -- -- --  PHOS -- -- --   Liver Function Tests:  Lab 07/01/12 0600  AST 12  ALT 11  ALKPHOS 80  BILITOT 0.1*  PROT 7.5  ALBUMIN 2.7*   CBC:  Lab 07/02/12 1110 06/30/12 1827 06/30/12  1211  WBC 14.3* 14.9* 16.0*  NEUTROABS -- -- 13.0*  HGB 9.9* 9.4* 9.7*  HCT 31.2* 29.8* 30.4*  MCV 87.6 87.9 87.6  PLT 375 337 325   CBG:  Lab 07/03/12 1222 07/03/12 1049 07/03/12 0757 07/02/12 2111 07/02/12 1831  GLUCAP 122* 124* 91 156* 102*    Recent Results (from the past 240 hour(s))  WOUND CULTURE     Status: Normal   Collection Time   06/30/12  7:27 PM      Component Value Range Status Comment   Specimen Description WOUND TOE   Final    Special Requests NONE   Final    Gram Stain     Final    Value: RARE WBC PRESENT, PREDOMINANTLY MONONUCLEAR     RARE SQUAMOUS EPITHELIAL  CELLS PRESENT     FEW GRAM POSITIVE COCCI IN CHAINS     IN PAIRS   Culture     Final    Value: FEW GROUP B STREP(S.AGALACTIAE)ISOLATED     Note: TESTING AGAINST S. AGALACTIAE NOT ROUTINELY PERFORMED DUE TO PREDICTABILITY OF AMP/PEN/VAN SUSCEPTIBILITY.   Report Status 07/02/2012 FINAL   Final      Scheduled Meds:   . aspirin  81 mg Oral Daily  . enalapril  5 mg Oral BID  . enoxaparin injection  40 mg Subcutaneous Q24H  . FLUoxetine  20 mg Oral QHS  . folic acid  1 mg Oral Daily  . insulin aspart  0-15 Units Subcutaneous TID WC  . insulin aspart  0-5 Units Subcutaneous QHS  . insulin glargine  32 Units Subcutaneous Daily  . insulin glargine  42 Units Subcutaneous QHS  . levofloxacin  IV  500 mg Intravenous Q24H  . metFORMIN  1,000 mg Oral BID WC  . metoprolol succinate  50 mg Oral Daily  . pantoprazole  40 mg Oral Daily  . simvastatin  40 mg Oral QPM  . topiramate  50 mg Oral QHS   Continuous Infusions:   . sodium chloride 50 mL/hr at 07/02/12 1610     Debbora Presto, MD  [facility] Pager [phone]  If 7PM-7AM, please contact night-coverage [email or web address] Password [email or web address] 07/03/2012, 2:06 PM   LOS: 3 days

## 2012-07-03 NOTE — Progress Notes (Signed)
Inpatient Diabetes Program Recommendations  AACE/ADA: New Consensus Statement on Inpatient Glycemic Control (2013)  Target Ranges:  Prepandial:   less than 140 mg/dL      Peak postprandial:   less than 180 mg/dL (1-2 hours)      Critically ill patients:  140 - 180 mg/dL   Reason for Visit: Consult received for "uncontrolled diabetes" I do not know how uncontrolled patient's diabetes is, as there is no current HgbA1C.  However the home lantus dose is half that of home, therefore I assume the diet is different at home. Ordered in-pt basic education per dietician, basic ed [place] notes, system video network ed.  Will talk with patient as well.  Inpatient Diabetes Program Recommendations HgbA1C: Last recorded HgbA1C was in 2008 of 8.5%  Please order a current HgbA1C.   Diet: Will order RD consult for diet basic ed.  Note: Thank you, Lenor Coffin, RN, CNS, Diabetes Coordinator [phone])

## 2012-07-03 NOTE — Progress Notes (Signed)
CBG was 124, pt. Scheduled for 32 units of Lantus, paged Dr. Sharman Crate to see if wanted to hold and if pt. Can take a shower.  Return call and orders given to hold Lantus this morning and pt. May shower.  Will continue to monitor.  Forbes Cellar, RN

## 2012-07-04 ENCOUNTER — Encounter (HOSPITAL_COMMUNITY): Payer: Self-pay | Admitting: Anesthesiology

## 2012-07-04 ENCOUNTER — Inpatient Hospital Stay (HOSPITAL_COMMUNITY): Payer: BC Managed Care – PPO | Admitting: Anesthesiology

## 2012-07-04 ENCOUNTER — Encounter (HOSPITAL_COMMUNITY)
Admission: EM | Disposition: A | Discharge status: Home Health | Payer: Self-pay | Source: Home / Self Care | Attending: Internal Medicine

## 2012-07-04 HISTORY — PX: AMPUTATION: SHX166

## 2012-07-04 LAB — BASIC METABOLIC PANEL
BUN: 31 mg/dL — ABNORMAL HIGH (ref 6–23)
CO2: 24 mEq/L (ref 19–32)
Calcium: 9.8 mg/dL (ref 8.4–10.5)
Creatinine, Ser: 1.26 mg/dL — ABNORMAL HIGH (ref 0.50–1.10)
GFR calc non Af Amer: 47 mL/min — ABNORMAL LOW (ref >90)
Glucose, Bld: 151 mg/dL — ABNORMAL HIGH (ref 70–99)

## 2012-07-04 LAB — CBC
Hemoglobin: 11 g/dL — ABNORMAL LOW (ref 12.0–15.0)
MCH: 27.8 pg (ref 26.0–34.0)
MCHC: 31 g/dL (ref 30.0–36.0)
MCV: 89.6 fL (ref 78.0–100.0)
RBC: 3.96 MIL/uL (ref 3.87–5.11)

## 2012-07-04 LAB — GLUCOSE, CAPILLARY: Glucose-Capillary: 160 mg/dL — ABNORMAL HIGH (ref 70–99)

## 2012-07-04 SURGERY — AMPUTATION, FOOT, RAY
Anesthesia: General | Site: Foot | Laterality: Right | Wound class: Dirty or Infected

## 2012-07-04 MED ORDER — CEFAZOLIN SODIUM-DEXTROSE 2-3 GM-% IV SOLR
INTRAVENOUS | Status: AC
  Filled 2012-07-04: qty 50

## 2012-07-04 MED ORDER — DEXAMETHASONE SODIUM PHOSPHATE 4 MG/ML IJ SOLN
INTRAMUSCULAR | Status: DC | PRN
  Administered 2012-07-04: 10 mg

## 2012-07-04 MED ORDER — ONDANSETRON HCL 4 MG/2ML IJ SOLN
INTRAMUSCULAR | Status: DC | PRN
  Administered 2012-07-04: 4 mg via INTRAVENOUS

## 2012-07-04 MED ORDER — HYDROMORPHONE HCL PF 1 MG/ML IJ SOLN: 0.2500 mg | INTRAMUSCULAR | Status: DC | PRN

## 2012-07-04 MED ORDER — FENTANYL CITRATE 0.05 MG/ML IJ SOLN: 50.0000 ug | INTRAMUSCULAR | Status: DC | PRN

## 2012-07-04 MED ORDER — 0.9 % SODIUM CHLORIDE (POUR BTL) OPTIME
TOPICAL | Status: DC | PRN
  Administered 2012-07-04: 1000 mL

## 2012-07-04 MED ORDER — FENTANYL CITRATE 0.05 MG/ML IJ SOLN
100.0000 ug | Freq: Once | INTRAMUSCULAR | Status: AC
  Administered 2012-07-04: 100 ug via INTRAVENOUS

## 2012-07-04 MED ORDER — PROMETHAZINE HCL 25 MG/ML IJ SOLN: 6.2500 mg | INTRAMUSCULAR | Status: DC | PRN

## 2012-07-04 MED ORDER — MIDAZOLAM HCL 2 MG/2ML IJ SOLN: 1.0000 mg | INTRAMUSCULAR | Status: DC | PRN

## 2012-07-04 MED ORDER — OXYCODONE HCL 5 MG PO TABS: 5.0000 mg | ORAL_TABLET | Freq: Once | ORAL | Status: DC | PRN

## 2012-07-04 MED ORDER — OXYCODONE HCL 5 MG/5ML PO SOLN: 5.0000 mg | Freq: Once | ORAL | Status: DC | PRN

## 2012-07-04 MED ORDER — CEFAZOLIN SODIUM-DEXTROSE 2-3 GM-% IV SOLR
INTRAVENOUS | Status: DC | PRN
  Administered 2012-07-04: 2 g via INTRAVENOUS

## 2012-07-04 MED ORDER — FENTANYL CITRATE 0.05 MG/ML IJ SOLN
INTRAMUSCULAR | Status: DC | PRN
  Administered 2012-07-04: 50 ug via INTRAVENOUS

## 2012-07-04 MED ORDER — PROPOFOL 10 MG/ML IV BOLUS
INTRAVENOUS | Status: DC | PRN
  Administered 2012-07-04: 140 mg via INTRAVENOUS

## 2012-07-04 MED ORDER — MIDAZOLAM HCL 2 MG/2ML IJ SOLN
INTRAMUSCULAR | Status: AC
  Filled 2012-07-04: qty 2

## 2012-07-04 MED ORDER — CHLORHEXIDINE GLUCONATE 4 % EX LIQD
60.0000 mL | Freq: Once | CUTANEOUS | Status: DC
  Filled 2012-07-04: qty 60

## 2012-07-04 MED ORDER — LACTATED RINGERS IV SOLN
INTRAVENOUS | Status: DC
  Administered 2012-07-04: 12:00:00 via INTRAVENOUS

## 2012-07-04 MED ORDER — FENTANYL CITRATE 0.05 MG/ML IJ SOLN
INTRAMUSCULAR | Status: AC
  Filled 2012-07-04: qty 2

## 2012-07-04 MED ORDER — BUPIVACAINE-EPINEPHRINE PF 0.5-1:200000 % IJ SOLN
INTRAMUSCULAR | Status: DC | PRN
  Administered 2012-07-04: 150 mg

## 2012-07-04 MED ORDER — MIDAZOLAM HCL 2 MG/2ML IJ SOLN
1.0000 mg | INTRAMUSCULAR | Status: DC | PRN
  Administered 2012-07-04: 2 mg via INTRAVENOUS

## 2012-07-04 MED ORDER — HYDROMORPHONE HCL PF 1 MG/ML IJ SOLN: 1.0000 mg | INTRAMUSCULAR | Status: DC | PRN

## 2012-07-04 SURGICAL SUPPLY — 40 items
BANDAGE ESMARK 6X9 LF (GAUZE/BANDAGES/DRESSINGS) IMPLANT
BANDAGE GAUZE ELAST BULKY 4 IN (GAUZE/BANDAGES/DRESSINGS) ×2 IMPLANT
BLADE SAW SGTL MED 73X18.5 STR (BLADE) ×2 IMPLANT
BNDG COHESIVE 4X5 TAN STRL (GAUZE/BANDAGES/DRESSINGS) ×2 IMPLANT
BNDG COHESIVE 6X5 TAN STRL LF (GAUZE/BANDAGES/DRESSINGS) IMPLANT
BNDG ESMARK 6X9 LF (GAUZE/BANDAGES/DRESSINGS)
CLOTH BEACON ORANGE TIMEOUT ST (SAFETY) ×2 IMPLANT
CUFF TOURNIQUET SINGLE 34IN LL (TOURNIQUET CUFF) IMPLANT
CUFF TOURNIQUET SINGLE 44IN (TOURNIQUET CUFF) IMPLANT
DRAPE U-SHAPE 47X51 STRL (DRAPES) ×2 IMPLANT
DRSG ADAPTIC 3X8 NADH LF (GAUZE/BANDAGES/DRESSINGS) ×2 IMPLANT
DRSG PAD ABDOMINAL 8X10 ST (GAUZE/BANDAGES/DRESSINGS) ×2 IMPLANT
DURAPREP 26ML APPLICATOR (WOUND CARE) ×2 IMPLANT
ELECT REM PT RETURN 9FT ADLT (ELECTROSURGICAL) ×2
ELECTRODE REM PT RTRN 9FT ADLT (ELECTROSURGICAL) ×1 IMPLANT
GLOVE BIOGEL PI IND STRL 9 (GLOVE) ×1 IMPLANT
GLOVE BIOGEL PI INDICATOR 9 (GLOVE) ×1
GLOVE SURG ORTHO 9.0 STRL STRW (GLOVE) ×2 IMPLANT
GOWN PREVENTION PLUS XLARGE (GOWN DISPOSABLE) ×2 IMPLANT
GOWN SRG XL XLNG 56XLVL 4 (GOWN DISPOSABLE) ×1 IMPLANT
GOWN STRL NON-REIN XL XLG LVL4 (GOWN DISPOSABLE) ×1
KIT BASIN OR (CUSTOM PROCEDURE TRAY) ×2 IMPLANT
KIT ROOM TURNOVER OR (KITS) ×2 IMPLANT
MANIFOLD NEPTUNE II (INSTRUMENTS) IMPLANT
NS IRRIG 1000ML POUR BTL (IV SOLUTION) ×2 IMPLANT
PACK ORTHO EXTREMITY (CUSTOM PROCEDURE TRAY) ×2 IMPLANT
PAD ARMBOARD 7.5X6 YLW CONV (MISCELLANEOUS) ×4 IMPLANT
PAD CAST 4YDX4 CTTN HI CHSV (CAST SUPPLIES) IMPLANT
PADDING CAST COTTON 4X4 STRL (CAST SUPPLIES)
SPONGE GAUZE 4X4 12PLY (GAUZE/BANDAGES/DRESSINGS) ×2 IMPLANT
SPONGE LAP 18X18 X RAY DECT (DISPOSABLE) ×2 IMPLANT
STAPLER VISISTAT 35W (STAPLE) IMPLANT
STOCKINETTE IMPERVIOUS LG (DRAPES) IMPLANT
SUCTION FRAZIER TIP 10 FR DISP (SUCTIONS) ×2 IMPLANT
SUT ETHILON 2 0 PSLX (SUTURE) ×8 IMPLANT
TOWEL OR 17X24 6PK STRL BLUE (TOWEL DISPOSABLE) ×2 IMPLANT
TOWEL OR 17X26 10 PK STRL BLUE (TOWEL DISPOSABLE) ×2 IMPLANT
TUBE CONNECTING 12X1/4 (SUCTIONS) ×2 IMPLANT
UNDERPAD 30X30 INCONTINENT (UNDERPADS AND DIAPERS) ×2 IMPLANT
WATER STERILE IRR 1000ML POUR (IV SOLUTION) IMPLANT

## 2012-07-04 NOTE — Progress Notes (Signed)
Orthopedic Tech Progress Note Patient Details:  Meghan [name] [birth year] [id number]  Ortho Devices Type of Ortho Device: Postop boot Ortho Device/Splint Location: (R) LE Ortho Device/Splint Interventions: Application   Jennye Moccasin 07/04/2012, 3:24 PM

## 2012-07-04 NOTE — Progress Notes (Signed)
Orthopedic Tech Progress Note Patient Details:  Meghan [name] 1956-04-18 [id number]  Patient ID: Meghan [name], female   DOB: [birth year], 56 y.o.   MRN: [id number]   Meghan [name] 07/04/2012, 2:17 PM CALLED [employer] FOR [facility].

## 2012-07-04 NOTE — Anesthesia Postprocedure Evaluation (Signed)
Anesthesia Post Note  Patient: Meghan [name]  Procedure(s) Performed: Procedure(s) (LRB): AMPUTATION RAY (Right)  Anesthesia type: General  Patient location: PACU  Post pain: Pain level controlled and Adequate analgesia  Post assessment: Post-op Vital signs reviewed, Patient's Cardiovascular Status Stable, Respiratory Function Stable, Patent Airway and Pain level controlled  Last Vitals:  Filed Vitals:   07/04/12 1300  BP: 95/36  Pulse: 85  Temp:   Resp: 16    Post vital signs: Reviewed and stable  Level of consciousness: awake, alert  and oriented  Complications: No apparent anesthesia complications

## 2012-07-04 NOTE — Op Note (Signed)
OPERATIVE REPORT  DATE OF SURGERY: 07/04/2012  PATIENT:  Meghan [name],  56 y.o. female  PRE-OPERATIVE DIAGNOSIS:  osteomyelitis right great toe  POST-OPERATIVE DIAGNOSIS:  osteomyelitis right great toe with forefoot abscess  PROCEDURE:  Procedure(s): Transmetatarsal amputation  SURGEON:  Surgeon(s): Nadara Mustard, MD  ANESTHESIA:   regional and general  EBL:  min ML  SPECIMEN:  Source of Specimen:  Right forefoot  TOURNIQUET:  * No tourniquets in log *  PROCEDURE DETAILS: Patient is a 56 year old woman with diabetes status post great toe amputation who presents at this time with abscess in the right forefoot as well as osteomyelitis of the first metatarsal. Patient's cellulitis has resolved with IV antibiotics and she presents at this time for surgical intervention for foot salvage either a first ray for transmetatarsal amputation. Risks and benefits were discussed including infection neurovascular injury nonhealing of the wound need for additional surgery. Patient states she understands and wished to proceed at this time. Description of procedure patient was brought to the operating room after undergoing a popliteal block she then underwent a general anesthetic. After adequate levels of anesthesia were obtained patient's right lower extremity was prepped using DuraPrep and draped into a sterile field. An incision was first made elliptically around the first ray to see if only a first ray amputation was required . Patient had a large abscess which extended across the forefoot and prevented a salvage of the third fourth and fifth toes. The incision was then converted to a incision for a transmetatarsal amputation and a transmetatarsal amputation was performed. This was removed in one block of tissue including the abscess tissue. The wounds irrigated hemostasis was obtained an incision was closed using 2-0 nylon. The wound was covered with Adaptic orthopedic sponges ABDs dressing  Kerlix and Coban. Patient was extubated taken to the PACU in stable condition.  PLAN OF CARE: Admit to inpatient   PATIENT DISPOSITION:  PACU - hemodynamically stable.   Nadara Mustard, MD 07/04/2012 12:40 PM

## 2012-07-04 NOTE — Progress Notes (Signed)
Brief Nutrition Note:  RD consulted for diet education. Weekend RD attempted to educate pt on DM diet and pt refused at that time. This RD attempted to educate again this morning, pt again refused education.   RD left education materials for pt in room to review if she decides.    No additional nutrition interventions at this time. Please re-consult as needed.   Clarene Duke RD, LDN Pager [phone] After Hours pager [phone]

## 2012-07-04 NOTE — Progress Notes (Signed)
Patient ID: Meghan [name], female   DOB: 08-01-1956, 56 y.o.   MRN: [id number] Plan for surgery today for right foot amputation of the great toe versus possible transmetatarsal amputation.

## 2012-07-04 NOTE — Anesthesia Preprocedure Evaluation (Addendum)
Anesthesia Evaluation  Patient identified by MRN, date of birth, ID band Patient awake    Reviewed: Allergy & Precautions, H&P , NPO status , Patient's Chart, lab work & pertinent test results, reviewed documented beta blocker date and time   Airway Mallampati: I TM Distance: >3 FB Neck ROM: Full    Dental  (+) Dental Advisory Given, Edentulous Upper and Edentulous Lower   Pulmonary neg pulmonary ROS,    Pulmonary exam normal       Cardiovascular hypertension, Pt. on medications     Neuro/Psych PSYCHIATRIC DISORDERS Depression negative neurological ROS     GI/Hepatic negative GI ROS, Neg liver ROS,   Endo/Other  diabetes, Insulin Dependent  Renal/GU Renal InsufficiencyRenal disease     Musculoskeletal   Abdominal   Peds  Hematology   Anesthesia Other Findings   Reproductive/Obstetrics                          Anesthesia Physical Anesthesia Plan  ASA: III  Anesthesia Plan: General   Post-op Pain Management:    Induction: Intravenous  Airway Management Planned: LMA  Additional Equipment:   Intra-op Plan:   Post-operative Plan: Extubation in OR  Informed Consent: I have reviewed the patients History and Physical, chart, labs and discussed the procedure including the risks, benefits and alternatives for the proposed anesthesia with the patient or authorized representative who has indicated his/her understanding and acceptance.   Dental advisory given  Plan Discussed with: CRNA, Anesthesiologist and Surgeon  Anesthesia Plan Comments:         Anesthesia Quick Evaluation

## 2012-07-04 NOTE — Progress Notes (Signed)
Patient ID: Meghan [name], female   DOB: June 03, 1956, 56 y.o.   MRN: [id number]  [facility] PROGRESS NOTE  CHAYSE GRACEY WUJ:[id number] DOB: 1955/11/02 DOA: 06/30/2012 PCP: Ignatius Specking., MD  Brief narrative:  Pt is a 56 y.o. female with past medical history of diabetes, hypertension and status post removal of second toe on right foot from diabetic complications who has had several days of increasing erythema, swelling and drainage from a blistering wound around her right great toe. She went to her primary care physician's office and was evaluated, but this was felt to be more related to gout and she was started on steroids. She was also on Bactrim for a UTI. In the meantime, patient continued with worsening erythema that started going up her leg along with increased swelling so she came to the emergency room.In ED, she was found have a white count of 16,000 and evaluation from the ER physician was requested for evaluation and management of cellulitis.   Principal Problem:  *Cellulitis of leg  - appreciate ortho input  - pt now status post right metatarsal amputation and doing well post op - continue Levaquin day #5, perhaps may discontinue in AM - WBC up since yesterday so check CBC again in AM  Active Problems:  History of gout  - stable  DM (diabetes mellitus), type 2, uncontrolled, periph vascular complications  - dose of insulin was increased and now better controlled  - will continue Lantus Anemia of chronic disease - Hg and Hct stable since yesterday  HTN (hypertension)  - stable  ARF (acute renal failure)  - creatinine is trending down  - BMP in AM   Consultants:  Dr. Cleophas Dunker, orthopedic surgery on call for Dr.Duda Procedures/Studies:  Mr Foot Right W Wo Contrast 07/03/2012 --> Cellulitis and skin ulceration with underlying myositis, No findings for septic arthritis, osteomyelitis, focal soft tissue absces Right metatarsal amputation 11/26 --> Dr.  Lajoyce Corners Antibiotics:  IV Levaquin day 5  Code Status: Full  Family Communication: Pt at bedside  Disposition Plan: Home when medically stable  HPI/Subjective: No events overnight.   Objective: Filed Vitals:   07/04/12 1315 07/04/12 1323 07/04/12 1327 07/04/12 1418  BP:    113/56  Pulse: 85 85 85 82  Temp:   98.5 F (36.9 C) 97.5 F (36.4 C)  TempSrc:    Oral  Resp: 16 14 15 18   Height:      Weight:      SpO2: 97% 97% 94% 97%    Intake/Output Summary (Last 24 hours) at 07/04/12 1605 Last data filed at 07/04/12 1241  Gross per 24 hour  Intake   1000 ml  Output     50 ml  Net    950 ml    Exam:   General:  Pt is alert, follows commands appropriately, not in acute distress  Cardiovascular: Regular rate and rhythm, S1/S2, no murmurs, no rubs, no gallops  Respiratory: Clear to auscultation bilaterally, no wheezing, no crackles, no rhonchi  Abdomen: Soft, non tender, non distended, bowel sounds present, no guarding  Extremities: No edema  Neuro: Grossly nonfocal  Data Reviewed: Basic Metabolic Panel:  Lab 07/04/12 9562 07/01/12 0600 06/30/12 1827 06/30/12 1211  NA 136 137 -- 134*  K 4.4 4.7 -- 4.5  CL 101 104 -- 99  CO2 24 20 -- 21  GLUCOSE 151* 79 -- 370*  BUN 31* 30* -- 34*  CREATININE 1.26* 1.30* 1.42* 1.52*  CALCIUM 9.8 9.2 -- 9.4  MG -- -- -- --  PHOS -- -- -- --   Liver Function Tests:  Lab 07/01/12 0600  AST 12  ALT 11  ALKPHOS 80  BILITOT 0.1*  PROT 7.5  ALBUMIN 2.7*   CBC:  Lab 07/04/12 0610 07/02/12 1110 06/30/12 1827 06/30/12 1211  WBC 16.3* 14.3* 14.9* 16.0*  NEUTROABS -- -- -- 13.0*  HGB 11.0* 9.9* 9.4* 9.7*  HCT 35.5* 31.2* 29.8* 30.4*  MCV 89.6 87.6 87.9 87.6  PLT 435* 375 337 325   CBG:  Lab 07/04/12 1414 07/04/12 1311 07/04/12 1020 07/04/12 0756 07/03/12 2125  GLUCAP 154* 140* 160* 154* 160*    Recent Results (from the past 240 hour(s))  WOUND CULTURE     Status: Normal   Collection Time   06/30/12  7:27 PM       Component Value Range Status Comment   Specimen Description WOUND TOE   Final    Special Requests NONE   Final    Gram Stain     Final    Value: RARE WBC PRESENT, PREDOMINANTLY MONONUCLEAR     RARE SQUAMOUS EPITHELIAL CELLS PRESENT     FEW GRAM POSITIVE COCCI IN CHAINS     IN PAIRS   Culture     Final    Value: FEW GROUP B STREP(S.AGALACTIAE)ISOLATED     Note: TESTING AGAINST S. AGALACTIAE NOT ROUTINELY PERFORMED DUE TO PREDICTABILITY OF AMP/PEN/VAN SUSCEPTIBILITY.   Report Status 07/02/2012 FINAL   Final   SURGICAL PCR SCREEN     Status: Normal   Collection Time   07/03/12  6:57 PM      Component Value Range Status Comment   MRSA, PCR NEGATIVE  NEGATIVE Final    Staphylococcus aureus NEGATIVE  NEGATIVE Final      Scheduled Meds:   . aspirin  81 mg Oral Daily  . chlorhexidine  60 mL Topical Once  . chlorhexidine  60 mL Topical Once  . enalapril  5 mg Oral BID  . enoxaparin (LOVENOX) injection  40 mg Subcutaneous Q24H  . [COMPLETED] fentaNYL  100 mcg Intravenous Once  . FLUoxetine  20 mg Oral QHS  . folic acid  1 mg Oral Daily  . insulin aspart  0-15 Units Subcutaneous TID WC  . insulin aspart  0-5 Units Subcutaneous QHS  . insulin glargine  27 Units Subcutaneous Daily  . insulin glargine  40 Units Subcutaneous QHS  . levofloxacin (LEVAQUIN) IV  500 mg Intravenous Q24H  . metFORMIN  1,000 mg Oral BID WC  . metoprolol succinate  50 mg Oral Daily  . pantoprazole  40 mg Oral Daily  . simvastatin  40 mg Oral QPM  . topiramate  50 mg Oral QHS   Continuous Infusions:   . sodium chloride 50 mL/hr at 07/04/12 0558  . lactated ringers 50 mL/hr at 07/04/12 1150     Debbora Presto, MD  [facility] Pager [phone]  If 7PM-7AM, please contact night-coverage [email or web address] Password [email or web address] 07/04/2012, 4:05 PM   LOS: 4 days

## 2012-07-04 NOTE — Transfer of Care (Signed)
Immediate Anesthesia Transfer of Care Note  Patient: Meghan [name]  Procedure(s) Performed: Procedure(s) (LRB) with comments: AMPUTATION RAY (Right) - great toe  amp vs ray amp  Patient Location: PACU  Anesthesia Type:General  Level of Consciousness: awake, alert  and oriented  Airway & Oxygen Therapy: Patient Spontanous Breathing and Patient connected to nasal cannula oxygen  Post-op Assessment: Report given to PACU RN and Post -op Vital signs reviewed and stable  Post vital signs: Reviewed and stable  Complications: No apparent anesthesia complications

## 2012-07-04 NOTE — Anesthesia Procedure Notes (Addendum)
Anesthesia Regional Block:  Popliteal block  Pre-Anesthetic Checklist: ,, timeout performed, Correct Patient, Correct Site, Correct Laterality, Correct Procedure, Correct Position, site marked, Risks and benefits discussed,  Surgical consent,  Pre-op evaluation,  At surgeon's request and post-op pain management  Laterality: Right  Prep: chloraprep       Needles:  Injection technique: Single-shot  Needle Type: Echogenic Stimulator Needle          Additional Needles:  Procedures: ultrasound guided (picture in chart) and nerve stimulator Popliteal block  Nerve Stimulator or Paresthesia:  Response: plantar flexion, 0.45 mA,   Additional Responses:   Narrative:  Start time: 07/04/2012 11:43 AM End time: 07/04/2012 11:54 AM Injection made incrementally with aspirations every 5 mL.  Performed by: Personally  Anesthesiologist: J. Adonis Huguenin, MD  Additional Notes: A functioning IV was confirmed and monitors were applied.  Sterile prep and drape, hand hygiene and sterile gloves were used.  Negative aspiration and test dose prior to incremental administration of local anesthetic. The patient tolerated the procedure well.Ultrasound  guidance: relevant anatomy identified, needle position confirmed, local anesthetic spread visualized around nerve(s), vascular puncture avoided.  Image printed for medical record.   Popliteal block Procedure Name: LMA Insertion Date/Time: 07/04/2012 12:06 PM Performed by: Arlice Colt B Pre-anesthesia Checklist: Patient identified, Emergency Drugs available, Suction available, Patient being monitored and Timeout performed Patient Re-evaluated:Patient Re-evaluated prior to inductionOxygen Delivery Method: Circle system utilized Preoxygenation: Pre-oxygenation with 100% oxygen Intubation Type: IV induction LMA: LMA inserted LMA Size: 4.0 Number of attempts: 1 Placement Confirmation: positive ETCO2 and breath sounds checked- equal and bilateral Tube  secured with: Tape Dental Injury: Teeth and Oropharynx as per pre-operative assessment

## 2012-07-05 LAB — CBC
HCT: 28.1 % — ABNORMAL LOW (ref 36.0–46.0)
Hemoglobin: 8.8 g/dL — ABNORMAL LOW (ref 12.0–15.0)
MCH: 27.4 pg (ref 26.0–34.0)
MCHC: 31.3 g/dL (ref 30.0–36.0)

## 2012-07-05 LAB — GLUCOSE, CAPILLARY
Glucose-Capillary: 270 mg/dL — ABNORMAL HIGH (ref 70–99)
Glucose-Capillary: 240 mg/dL — ABNORMAL HIGH (ref 70–99)

## 2012-07-05 LAB — BASIC METABOLIC PANEL
BUN: 30 mg/dL — ABNORMAL HIGH (ref 6–23)
CO2: 24 mEq/L (ref 19–32)
GFR calc non Af Amer: 55 mL/min — ABNORMAL LOW (ref >90)
Glucose, Bld: 319 mg/dL — ABNORMAL HIGH (ref 70–99)
Potassium: 4.9 mEq/L (ref 3.5–5.1)

## 2012-07-05 MED ORDER — INSULIN GLARGINE 100 UNIT/ML ~~LOC~~ SOLN
40.0000 [IU] | Freq: Every day | SUBCUTANEOUS | Status: DC
  Administered 2012-07-06: 40 [IU] via SUBCUTANEOUS

## 2012-07-05 MED ORDER — INSULIN GLARGINE 100 UNIT/ML ~~LOC~~ SOLN
60.0000 [IU] | Freq: Every day | SUBCUTANEOUS | Status: DC
  Administered 2012-07-05: 60 [IU] via SUBCUTANEOUS

## 2012-07-05 MED ORDER — INSULIN GLARGINE 100 UNIT/ML ~~LOC~~ SOLN
10.0000 [IU] | Freq: Once | SUBCUTANEOUS | Status: AC
  Administered 2012-07-05: 10 [IU] via SUBCUTANEOUS

## 2012-07-05 NOTE — Progress Notes (Signed)
Patient ID: Meghan [name], female   DOB: 09-09-1955, 56 y.o.   MRN: [id number]  [facility] PROGRESS NOTE  Meghan [name] WUJ:[id number] DOB: Feb 15, 1956 DOA: 06/30/2012 PCP: Ignatius Specking., MD  Brief narrative:  Pt is [name] 56 y.o. female with past medical history of diabetes, hypertension and status post removal of second toe on right foot from diabetic complications who has had several days of increasing erythema, swelling and drainage from [name] blistering wound around her right great toe. She went to her primary care physician's office and was evaluated, but this was felt to be more related to gout and she was started on steroids. She was also on Bactrim for [name] UTI. In the meantime, patient continued with worsening erythema that started going up her leg along with increased swelling so she came to the emergency room. In ED, she was found have [name] white count of 16,000 and evaluation from the ER physician was requested for evaluation and management of cellulitis.   Principal Problem:  Cellulitis of leg  Appreciate ortho input. 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 Antibiotics discontinued. Received 5 day course of Levaquin. WBC improved from yesterday. Per ortho, Dr. Lajoyce Corners does not need antibiotics at discharge. Arranged home health PT. Patient to weightbear on heel as needed with postoperative shoe in place. To followup with Dr. Lajoyce Corners in 2 weeks.  History of gout  Stable   DM (diabetes mellitus), type 2, uncontrolled, periph vascular complications  No well controlled. Continue to increase insulin dose. Given lantus 10 units SQ once. Likely can be resumed on home regimen at discharge.   Anemia of chronic disease Hg and Hct stable.   HTN (hypertension)  Stable   ARF (acute renal failure)  Resolved.   Consultants:  Dr. Cleophas Dunker, orthopedic surgery on call for Dr.Duda  Procedures/Studies:  Mr Foot Right W Wo Contrast  07/03/2012 --> Cellulitis and skin ulceration with underlying myositis, No findings for septic arthritis, osteomyelitis, focal soft tissue absces Right metatarsal amputation 11/26 --> Dr. Lajoyce Corners  Antibiotics:  Completed 5 day course of levofloxacin on 07/04/2012.   Code Status: Full  Family Communication: Pt at bedside  Disposition Plan: Home with home health on 07/06/2012.   HPI/Subjective: No specific concerns. Pain under control.   Objective: Filed Vitals:   07/04/12 2133 07/05/12 0542 07/05/12 0612 07/05/12 1005  BP: 112/72  105/55 104/53  Pulse: 77  77 72  Temp: 98.3 F (36.8 C)  98 F (36.7 C)   TempSrc: Oral  Oral   Resp: 18  20   Height:  5\' 7"  (1.702 m)    Weight:  97.478 kg (214 lb 14.4 oz)    SpO2: 96%  96%     Intake/Output Summary (Last 24 hours) at 07/05/12 1016 Last data filed at 07/05/12 0933  Gross per 24 hour  Intake   1060 ml  Output    350 ml  Net    710 ml    Exam: Physical Exam: General: Awake, Oriented, No acute distress. HEENT: EOMI. Neck: Supple CV: S1 and S2 Lungs: Clear to ascultation bilaterally Abdomen: Soft, Nontender, Nondistended, +bowel sounds. Ext: Good pulses. Trace edema. RLE in bandages.  Data Reviewed: Basic Metabolic Panel:  Lab 07/05/12 9562 07/04/12 0610 07/01/12 0600 06/30/12 1827 06/30/12 1211  NA 134* 136 137 -- 134*  K 4.9 4.4 4.7 -- 4.5  CL 100 101 104 -- 99  CO2 24 24 20  -- 21  GLUCOSE 319* 151*  79 -- 370*  BUN 30* 31* 30* -- 34*  CREATININE 1.10 1.26* 1.30* 1.42* 1.52*  CALCIUM 9.0 9.8 9.2 -- 9.4  MG -- -- -- -- --  PHOS -- -- -- -- --   Liver Function Tests:  Lab 07/01/12 0600  AST 12  ALT 11  ALKPHOS 80  BILITOT 0.1*  PROT 7.5  ALBUMIN 2.7*   CBC:  Lab 07/05/12 0510 07/04/12 0610 07/02/12 1110 06/30/12 1827 06/30/12 1211  WBC 15.7* 16.3* 14.3* 14.9* 16.0*  NEUTROABS -- -- -- -- 13.0*  HGB 8.8* 11.0* 9.9* 9.4* 9.7*  HCT 28.1* 35.5* 31.2* 29.8* 30.4*  MCV 87.5 89.6 87.6 87.9 87.6  PLT 396 435*  375 337 325   CBG:  Lab 07/05/12 0747 07/04/12 2203 07/04/12 1654 07/04/12 1414 07/04/12 1311  GLUCAP 270* 314* 190* 154* 140*    Recent Results (from the past 240 hour(s))  WOUND CULTURE     Status: Normal   Collection Time   06/30/12  7:27 PM      Component Value Range Status Comment   Specimen Description WOUND TOE   Final    Special Requests NONE   Final    Gram Stain     Final    Value: RARE WBC PRESENT, PREDOMINANTLY MONONUCLEAR     RARE SQUAMOUS EPITHELIAL CELLS PRESENT     FEW GRAM POSITIVE COCCI IN CHAINS     IN PAIRS   Culture     Final    Value: FEW GROUP B STREP(S.AGALACTIAE)ISOLATED     Note: TESTING AGAINST S. AGALACTIAE NOT ROUTINELY PERFORMED DUE TO PREDICTABILITY OF AMP/PEN/VAN SUSCEPTIBILITY.   Report Status 07/02/2012 FINAL   Final   SURGICAL PCR SCREEN     Status: Normal   Collection Time   07/03/12  6:57 PM      Component Value Range Status Comment   MRSA, PCR NEGATIVE  NEGATIVE Final    Staphylococcus aureus NEGATIVE  NEGATIVE Final      Scheduled Meds:    . aspirin  81 mg Oral Daily  . chlorhexidine  60 mL Topical Once  . chlorhexidine  60 mL Topical Once  . enalapril  5 mg Oral BID  . enoxaparin (LOVENOX) injection  40 mg Subcutaneous Q24H  . [COMPLETED] fentaNYL  100 mcg Intravenous Once  . FLUoxetine  20 mg Oral QHS  . folic acid  1 mg Oral Daily  . insulin aspart  0-15 Units Subcutaneous TID WC  . insulin aspart  0-5 Units Subcutaneous QHS  . insulin glargine  27 Units Subcutaneous Daily  . insulin glargine  40 Units Subcutaneous QHS  . levofloxacin (LEVAQUIN) IV  500 mg Intravenous Q24H  . metoprolol succinate  50 mg Oral Daily  . pantoprazole  40 mg Oral Daily  . simvastatin  40 mg Oral QPM  . topiramate  50 mg Oral QHS  . [DISCONTINUED] metFORMIN  1,000 mg Oral BID WC   Continuous Infusions:    . sodium chloride 50 mL/hr at 07/04/12 2300  . lactated ringers 50 mL/hr at 07/05/12 0500     Meghan [name] A, MD  [facility] Pager  [phone]  If 7PM-7AM, please contact night-coverage [email or web address] Password [email or web address] 07/05/2012, 10:16 AM   LOS: 5 days

## 2012-07-05 NOTE — Progress Notes (Signed)
Occupational Therapy Evaluation Patient Details Name: Meghan [name] MRN: [id number] DOB: 21-Dec-1955 Today's Date: 07/05/2012 Time: 4098-1191 OT Time Calculation (min): 31 min  OT Assessment / Plan / Recommendation Clinical Impression  56 yo s/p transmetatarsal amputation. Completed all education regarding compensatory techniques for ADL @ RW level. Pt will benefit from use of shower chair and states she will buy one at home. Discussed available AE to assist with ADL. Pt verbalized understanding. Making excellent progress. Completed functional moiblity @ mod I level this pm. Pt apparently pleased with her progress. No further OT indicated at this time. Ready for D/C tomorrow.    OT Assessment  Patient does not need any further OT services    Follow Up Recommendations  No OT follow up    Barriers to Discharge  family building ramp    Equipment Recommendations  Tub/shower seat    Recommendations for Other Services  none  Frequency    eval only   Precautions / Restrictions Precautions Precautions: None Required Braces or Orthoses: Other Brace/Splint Other Brace/Splint: Postop shoe Restrictions Weight Bearing Restrictions: Yes RLE Weight Bearing:  (wt bear via heel only)   Pertinent Vitals/Pain No c/o pain    ADL  Grooming: Modified independent Where Assessed - Grooming: Unsupported standing Upper Body Bathing: Modified independent Where Assessed - Upper Body Bathing: Unsupported sit to stand Lower Body Bathing: Supervision/safety;Set up Where Assessed - Lower Body Bathing: Unsupported sit to stand Upper Body Dressing: Modified independent Where Assessed - Upper Body Dressing: Unsupported sitting Lower Body Dressing: Supervision/safety;Set up Where Assessed - Lower Body Dressing: Unsupported sit to stand Toilet Transfer: Supervision/safety Toilet Transfer Method: Sit to Barista: Comfort height toilet Toileting - Clothing Manipulation and  Hygiene: Modified independent Where Assessed - Engineer, mining and Hygiene: Sit to stand from 3-in-1 or toilet Tub/Shower Transfer: Supervision/safety Tub/Shower Transfer Method: Ambulating Equipment Used: Gait belt;Rolling walker Transfers/Ambulation Related to ADLs: S RW level ADL Comments: Pt completing ADL with overall S. discussed E conservation and compensatory techniques for ADL following WB restrtictions. Discussed home safety.    OT Diagnosis:    OT Problem List:   OT Treatment Interventions:     OT Goals Acute Rehab OT Goals OT Goal Formulation:  (eval only)  Visit Information  Last OT Received On: 07/05/12 Assistance Needed: +1    Subjective Data      Prior Functioning     Home Living Lives With: Spouse Available Help at Discharge: Family;Available 24 hours/day Type of Home: House Home Access: Stairs to enter [employer] of Steps: 3 Entrance Stairs-Rails: Right;Left;Can reach both Home Layout: One level Bathroom Shower/Tub: Health visitor: Handicapped height Bathroom Accessibility: Yes How Accessible: Accessible via walker Home Adaptive Equipment: Walker - rolling Prior Function Level of Independence: Independent Able to Take Stairs?: Yes Driving: Yes Vocation: Retired Musician: No difficulties Dominant Hand: Right         Vision/Perception     Cognition  Overall Cognitive Status: Appears within functional limits for tasks assessed/performed Arousal/Alertness: Awake/alert Orientation Level: Appears intact for tasks assessed Behavior During Session: [facility] for tasks performed    Extremity/Trunk Assessment Right Upper Extremity Assessment RUE ROM/Strength/Tone: WFL for tasks assessed RUE Sensation: WFL - Light Touch;WFL - Proprioception RUE Coordination: WFL - gross/fine motor Left Upper Extremity Assessment LUE ROM/Strength/Tone: WFL for tasks assessed LUE Sensation: WFL - Light  Touch;WFL - Proprioception LUE Coordination: WFL - gross/fine motor Right Lower Extremity Assessment RLE ROM/Strength/Tone: Deficits;Due to pain;Due to precautions  RLE ROM/Strength/Tone Deficits: transmet amputaion; limited WBing status; Able to straight leg raise and move RLE freely Left Lower Extremity Assessment LLE ROM/Strength/Tone: [facility] for tasks assessed Trunk Assessment Trunk Assessment: Normal     Mobility Bed Mobility Bed Mobility: Supine to Sit Supine to Sit: 6: Modified independent (Device/Increase time) Transfers Transfers: Sit to Stand;Stand to Sit Sit to Stand: 6: Modified independent (Device/Increase time);With upper extremity assist;From bed Stand to Sit: 6: Modified independent (Device/Increase time);With upper extremity assist;To chair/3-in-1 Details for Transfer Assistance: good safety awareness     Shoulder Instructions     Exercise     Balance  Mod I   End of Session OT - End of Session Equipment Utilized During Treatment: Gait belt;Other (comment) (postop shoe) Activity Tolerance: Patient tolerated treatment well Patient left: in chair;with call bell/phone within reach;with nursing in room Nurse Communication: Mobility status  GO     Rockland Kotarski,HILLARY 07/05/2012, 6:27 PM [facility], OTR/L  [phone] 07/05/2012

## 2012-07-05 NOTE — Care Management Note (Signed)
    Page 1 of 2   07/05/2012     10:52:58 AM   CARE MANAGEMENT NOTE 07/05/2012  Patient:  Meghan [name], Meghan [name]   Account Number:  [id number]  Date Initiated:  07/05/2012  Documentation initiated by:  Letha Cape  Subjective/Objective Assessment:   dx cellulitis of R leg  s/p transmetatarsal amputation  admit- lives with spouse.     Action/Plan:   pt eval-rec hhpt   Anticipated DC Date:  07/06/2012   Anticipated DC Plan:  HOME W HOME HEALTH SERVICES      DC Planning Services  CM consult      [facility] Choice  HOME HEALTH   Choice offered to / List presented to:  C-1 Patient   DME arranged  WHEELCHAIR - MANUAL      DME agency  [facility]     HH arranged  HH-2 PT      [facility] agency  [facility]   Status of service:  Completed, signed off Medicare Important Message given?   (If response is "NO", the following Medicare IM given date fields will be blank) Date Medicare IM given:   Date Additional Medicare IM given:    Discharge Disposition:  HOME W HOME HEALTH SERVICES  Per UR Regulation:  Reviewed for med. necessity/level of care/duration of stay  If discussed at Long Length of Stay Meetings, dates discussed:    Comments:  07/05/12 10:47 Letha Cape RN, BSN [phone] patient lives with spouse, patient is s/p transmetatarsal amputation, per physical therapy recs hhpt with w/chair. Patient chose [facility] from agency list, referral made to [facility] , Lupita Leash notified by voice mail.  Soc will begin 24-48 hrs post discharge. Patient is for discharge tomorrow.

## 2012-07-05 NOTE — Progress Notes (Signed)
Inpatient Diabetes Program Recommendations  AACE/ADA: New Consensus Statement on Inpatient Glycemic Control (2013)  Target Ranges:  Prepandial:   less than 140 mg/dL      Peak postprandial:   less than 180 mg/dL (1-2 hours)      Critically ill patients:  140 - 180 mg/dL   Reason for Visit: Fasting and post-prandial hyperglycejmia post-surgical amputation  Inpatient Diabetes Program Recommendations Insulin - Basal: Please increase lantus doses.  Fasting  glucose this am at 270 mg/dL, before lunch at 086 mg/dL. Increase lantus to to 40 units in the am and 50 units at HS. Insulin - Meal Coverage: Please add meal coverage as well at 4 units tidwc. HgbA1C: Last recorded HgbA1C was in 2008 of 8.5%  Please order a current HgbA1C.   Diet: Will order RD consult for diet basic ed.  Note: Thank you, Lenor Coffin, RN, CNS, Diabetes Coordinator [phone])

## 2012-07-05 NOTE — Progress Notes (Signed)
Patient ID: Meghan [name], female   DOB: 19-Jul-1956, 56 y.o.   MRN: [id number] Postoperative day 1 transmetatarsal amputation. Patient is safe for discharge to home from an orthopedic standpoint when she can safely transfer. She may be weightbearing on the heel as needed with her postoperative shoe in place. Will not need antibiotics at discharge. I'll followup in the office in 2 weeks.

## 2012-07-05 NOTE — Progress Notes (Signed)
Physical Therapy Treatment Patient Details Name: Meghan [name] MRN: [id number] DOB: [birth year] Today's Date: 07/05/2012 Time: 1340-1400 PT Time Calculation (min): 20 min  PT Assessment / Plan / Recommendation Comments on Treatment Session  Much more confident this session with ability to WB through heel; Recommend Darco shoe to ensure no weight going through forefoot; Pt reposrts they will have a ramp in tomorrow for home access    Follow Up Recommendations  Home health PT;Supervision/Assistance - 24 hour     Does the patient have the potential to tolerate intense rehabilitation     Barriers to Discharge        Equipment Recommendations  Wheelchair (measurements);Wheelchair cushion (measurements)    Recommendations for Other Services    Frequency Min 3X/week   Plan Discharge plan remains appropriate    Precautions / Restrictions Precautions Precautions: Fall Other Brace/Splint: Postop shoe Restrictions RLE Weight Bearing:  (can WB through heel)   Pertinent Vitals/Pain no apparent distress     Mobility  Transfers Transfers: Sit to Stand;Stand to Sit Sit to Stand: 5: Supervision Stand to Sit: 5: Supervision Details for Transfer Assistance: cues for hand placement; Better control for stand to sit Ambulation/Gait Ambulation/Gait Assistance: 4: Min guard Ambulation Distance (Feet): 25 Feet Assistive device: Rolling walker Ambulation/Gait Assistance Details: Much improved steadiness with gait, being able to WB through heel; pt also more confident; Still, in post op shoe, it's hard to tell if pt is WBing through heel only (had difficulty with dorsiflexion); continue to thisnk Darco shoe would be beneficial Gait Pattern: Step-to pattern    Exercises     PT Diagnosis:    PT Problem List:   PT Treatment Interventions:     PT Goals Acute Rehab PT Goals Time For Goal Achievement: 07/12/12 Potential to Achieve Goals: Good Pt will go Sit to Stand: with modified  independence PT Goal: Sit to Stand - Progress: Progressing toward goal Pt will go Stand to Sit: with modified independence PT Goal: Stand to Sit - Progress: Progressing toward goal Pt will Transfer Bed to Chair/Chair to Bed: with modified independence PT Transfer Goal: Bed to Chair/Chair to Bed - Progress: Progressing toward goal Pt will Ambulate: 51 - 150 feet;with min assist;with rolling walker PT Goal: Ambulate - Progress: Progressing toward goal  Visit Information  Last PT Received On: 07/05/12 Assistance Needed: +1    Subjective Data  Subjective: Seems more confident after this pm session Patient Stated Goal: be able to walk again   Cognition  Overall Cognitive Status: Appears within functional limits for tasks assessed/performed Arousal/Alertness: Awake/alert Orientation Level: Appears intact for tasks assessed Behavior During Session: [facility] for tasks performed    Balance     End of Session PT - End of Session Equipment Utilized During Treatment: Gait belt Activity Tolerance: Patient tolerated treatment well Patient left: in chair;with call bell/phone within reach Nurse Communication: Mobility status   GP     Olen Pel [place], [place] [phone]  07/05/2012, 3:10 PM

## 2012-07-05 NOTE — Progress Notes (Signed)
Physical Therapy Evaluation Patient Details Name: Meghan [name] MRN: [id number] DOB: Dec 31, 1955 Today's Date: 07/05/2012 Time: 0829-0907 PT Time Calculation (min): 38 min  PT Assessment / Plan / Recommendation Clinical Impression  56 yo female s/p R transmetatarsal amputation presents with decr functional mobility; Will benefit form PT to maximize independence and safety with mobility, and make recommendations to facilitate dc home    PT Assessment  Patient needs continued PT services    Follow Up Recommendations  Home health PT;Supervision/Assistance - 24 hour    Does the patient have the potential to tolerate intense rehabilitation      Barriers to Discharge Inaccessible home environment 3 steps to enter, with difficulty stepping Left foot    Equipment Recommendations  Wheelchair (measurements);Wheelchair cushion (measurements) (R Secondary school teacher; consider ambulance transport home) Husband is looking into getting a ramp   Recommendations for Other Services     Frequency Min 3X/week    Precautions / Restrictions Precautions Precautions: Fall Required Braces or Orthoses: Other Brace/Splint Other Brace/Splint: Postop shoe Restrictions RLE Weight Bearing:  (Per most recent note from [place], can WB heel)   Pertinent Vitals/Pain Premedicated for pain      Mobility  Bed Mobility Bed Mobility: Supine to Sit Supine to Sit: 5: Supervision Details for Bed Mobility Assistance: pretty smooth transition Transfers Transfers: Sit to Stand;Stand to Sit Sit to Stand: 4: Min guard;With upper extremity assist;From bed Stand to Sit: 4: Min guard;With upper extremity assist;With armrests;To chair/3-in-1 Details for Transfer Assistance: Cues for safety, hand placement, and NWB (which has now been upgraded to WB through heel only); Good rise from bed and good control of descent to chair Ambulation/Gait Ambulation/Gait Assistance: 4: Min assist;2: Max Environmental consultant  (Feet): 5 Feet Assistive device: Rolling walker Ambulation/Gait Assistance Details: verbal and demonstrational cues for technique; Noted difficulty with NWBing RLE; one episode of significant loss of balance posteriorly requiring max assist to prevent fall; pt then was quite anxious with taking steps Gait Pattern: Step-to pattern    Shoulder Instructions     Exercises     PT Diagnosis: Difficulty walking  PT Problem List: Decreased strength;Decreased activity tolerance;Decreased balance;Decreased mobility;Decreased coordination;Decreased knowledge of use of DME;Pain PT Treatment Interventions: DME instruction;Gait training;Stair training;Functional mobility training;Therapeutic activities;Therapeutic exercise;Balance training;Patient/family education;Wheelchair mobility training   PT Goals Acute Rehab PT Goals PT Goal Formulation: With patient Time For Goal Achievement: 07/12/12 Potential to Achieve Goals: Good Pt will go Supine/Side to Sit: with modified independence PT Goal: Supine/Side to Sit - Progress: Goal set today Pt will go Sit to Supine/Side: with modified independence PT Goal: Sit to Supine/Side - Progress: Goal set today Pt will go Sit to Stand: with modified independence PT Goal: Sit to Stand - Progress: Goal set today Pt will go Stand to Sit: with modified independence PT Goal: Stand to Sit - Progress: Goal set today Pt will Transfer Bed to Chair/Chair to Bed: with modified independence PT Transfer Goal: Bed to Chair/Chair to Bed - Progress: Goal set today Pt will Ambulate: 51 - 150 feet;with min assist;with rolling walker PT Goal: Ambulate - Progress: Goal set today Pt will Go Up / Down Stairs: 3-5 stairs;with min assist;with rail(s) PT Goal: Up/Down Stairs - Progress: Goal set today  Visit Information  Last PT Received On: 07/05/12 Assistance Needed: +1    Subjective Data  Subjective: Agreeable to OOB Patient Stated Goal: be able to walk again   Prior  Functioning  Home Living Lives With: Spouse Available Help at Discharge:  Family;Available 24 hours/day Type of Home: House Home Access: Stairs to enter [employer] of Steps: 3 (husband looking into ramp) Entrance Stairs-Rails: Right;Left;Can reach both (back steps) Home Layout: One level Bathroom Shower/Tub: Naval architect Equipment: Bedside commode/3-in-1;Walker - rolling Prior Function Level of Independence: Independent Able to Take Stairs?: Yes Driving: Yes Communication Communication: No difficulties    Cognition  Overall Cognitive Status: Appears within functional limits for tasks assessed/performed Arousal/Alertness: Awake/alert Orientation Level: Appears intact for tasks assessed Behavior During Session: [facility] for tasks performed    Extremity/Trunk Assessment Right Upper Extremity Assessment RUE ROM/Strength/Tone: [facility] for tasks assessed Left Upper Extremity Assessment LUE ROM/Strength/Tone: WFL for tasks assessed Right Lower Extremity Assessment RLE ROM/Strength/Tone: Deficits RLE ROM/Strength/Tone Deficits: transmet amputaion; limited WBing status; Able to straight leg raise and move RLE freely Left Lower Extremity Assessment LLE ROM/Strength/Tone: Within functional levels Trunk Assessment Trunk Assessment: Normal   Balance    End of Session PT - End of Session Equipment Utilized During Treatment: Gait belt Activity Tolerance: Patient tolerated treatment well (though anxious after loss of balance) Patient left: in chair;with call bell/phone within reach;with family/visitor present Nurse Communication: Mobility status;Precautions;Weight bearing status  GP     Olen Pel Ferguson, [place] [phone]  07/05/2012, 12:01 PM

## 2012-07-06 LAB — BASIC METABOLIC PANEL
BUN: 32 mg/dL — ABNORMAL HIGH (ref 6–23)
CO2: 25 mEq/L (ref 19–32)
Calcium: 9.7 mg/dL (ref 8.4–10.5)
Chloride: 103 mEq/L (ref 96–112)
Creatinine, Ser: 1.14 mg/dL — ABNORMAL HIGH (ref 0.50–1.10)
GFR calc Af Amer: 61 mL/min — ABNORMAL LOW (ref >90)
GFR calc non Af Amer: 53 mL/min — ABNORMAL LOW (ref >90)
Glucose, Bld: 147 mg/dL — ABNORMAL HIGH (ref 70–99)
Potassium: 4 mEq/L (ref 3.5–5.1)
Sodium: 139 mEq/L (ref 135–145)

## 2012-07-06 LAB — GLUCOSE, CAPILLARY: Glucose-Capillary: 137 mg/dL — ABNORMAL HIGH (ref 70–99)

## 2012-07-06 LAB — CBC
HCT: 32 % — ABNORMAL LOW (ref 36.0–46.0)
Hemoglobin: 9.8 g/dL — ABNORMAL LOW (ref 12.0–15.0)
MCH: 26.7 pg (ref 26.0–34.0)
MCHC: 30.6 g/dL (ref 30.0–36.0)
MCV: 87.2 fL (ref 78.0–100.0)
Platelets: 361 10*3/uL (ref 150–400)
RBC: 3.67 MIL/uL — ABNORMAL LOW (ref 3.87–5.11)
RDW: 15.3 % (ref 11.5–15.5)
WBC: 11.7 10*3/uL — ABNORMAL HIGH (ref 4.0–10.5)

## 2012-07-06 MED ORDER — OXYCODONE HCL 5 MG PO TABS: 2.5000 mg | ORAL_TABLET | Freq: Four times a day (QID) | ORAL | Status: DC | PRN

## 2012-07-06 NOTE — Progress Notes (Signed)
Patient ID: Meghan [name], female   DOB: 09/16/1955, 56 y.o.   MRN: [id number]  [facility] PROGRESS NOTE  Meghan [name] XBJ:[id number] DOB: 29-Jan-1956 DOA: 06/30/2012 PCP: Meghan Specking., MD  Brief narrative:  Pt is a 56 y.o. female with past medical history of diabetes, hypertension and status post removal of second toe on right foot from diabetic complications who has had several days of increasing erythema, swelling and drainage from a blistering wound around her right great toe. She went to her primary care physician's office and was evaluated, but this was felt to be more related to gout and she was started on steroids. She was also on Bactrim for a UTI. In the meantime, patient continued with worsening erythema that started going up her leg along with increased swelling so she came to the emergency room. In ED, she was found have a white count of 16,000 and evaluation from the ER physician was requested for evaluation and management of cellulitis.   Principal Problem:  Cellulitis of leg  Appreciate ortho input. 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 Received 5 day course of Levaquin. WBC improved from yesterday. Per ortho, Meghan [name] does not need antibiotics at discharge. Arranged home health PT. Patient to weightbear on heel as needed with postoperative shoe in place. To followup with Meghan [name] in 2 weeks.  History of gout  Stable   DM (diabetes mellitus), type 2, uncontrolled, periph vascular complications  Improved. Continue to increase insulin dose. Given lantus 10 units SQ once. Resumed home regimen at discharge.   Anemia of chronic disease Hg and Hct stable.   HTN (hypertension)  Stable   ARF (acute renal failure)  Resolved.   Consultants:  Meghan [name], orthopedic surgery on call for Meghan [name]  Procedures/Studies:  Mr Foot Right W Wo Contrast 07/03/2012 --> Cellulitis and skin ulceration with underlying  myositis, No findings for septic arthritis, osteomyelitis, focal soft tissue absces Right metatarsal amputation 11/26 --> Meghan [name]  Antibiotics:  Completed 5 day course of levofloxacin on 07/04/2012.   Code Status: Full  Family Communication: Pt at bedside  Disposition Plan: Home with home health on 07/06/2012.   HPI/Subjective: No specific concerns. Pain under control. Eager to go home.  Objective: Filed Vitals:   07/05/12 1005 07/05/12 1331 07/05/12 2041 07/06/12 0526  BP: 104/53 115/62 101/44 96/62  Pulse: 72 73 71 69  Temp:  98 F (36.7 C) 98.8 F (37.1 C) 98.4 F (36.9 C)  TempSrc:  Oral Oral Oral  Resp:  18 17 18   Height:      Weight:      SpO2:  99% 94% 94%    Intake/Output Summary (Last 24 hours) at 07/06/12 1017 Last data filed at 07/05/12 1853  Gross per 24 hour  Intake    240 ml  Output      0 ml  Net    240 ml    Exam: Physical Exam: General: Awake, Oriented, No acute distress. HEENT: EOMI. Neck: Supple CV: S1 and S2 Lungs: Clear to ascultation bilaterally Abdomen: Soft, Nontender, Nondistended, +bowel sounds. Ext: Good pulses. Trace edema. RLE in bandages.  Data Reviewed: Basic Metabolic Panel:  Lab 07/06/12 3086 07/05/12 0510 07/04/12 0610 07/01/12 0600 06/30/12 1827 06/30/12 1211  NA 139 134* 136 137 -- 134*  K 4.0 4.9 4.4 4.7 -- 4.5  CL 103 100 101 104 -- 99  CO2 25 24 24 20  -- 21  GLUCOSE 147* 319* 151*  79 -- 370*  BUN 32* 30* 31* 30* -- 34*  CREATININE 1.14* 1.10 1.26* 1.30* 1.42* --  CALCIUM 9.7 9.0 9.8 9.2 -- 9.4  MG -- -- -- -- -- --  PHOS -- -- -- -- -- --   Liver Function Tests:  Lab 07/01/12 0600  AST 12  ALT 11  ALKPHOS 80  BILITOT 0.1*  PROT 7.5  ALBUMIN 2.7*   CBC:  Lab 07/06/12 0532 07/05/12 0510 07/04/12 0610 07/02/12 1110 06/30/12 1827 06/30/12 1211  WBC 11.7* 15.7* 16.3* 14.3* 14.9* --  NEUTROABS -- -- -- -- -- 13.0*  HGB 9.8* 8.8* 11.0* 9.9* 9.4* --  HCT 32.0* 28.1* 35.5* 31.2* 29.8* --  MCV 87.2 87.5 89.6  87.6 87.9 --  PLT 361 396 435* 375 337 --   CBG:  Lab 07/06/12 0756 07/05/12 2130 07/05/12 1714 07/05/12 1150 07/05/12 0747  GLUCAP 137* 204* 240* 339* 270*    Recent Results (from the past 240 hour(s))  WOUND CULTURE     Status: Normal   Collection Time   06/30/12  7:27 PM      Component Value Range Status Comment   Specimen Description WOUND TOE   Final    Special Requests NONE   Final    Gram Stain     Final    Value: RARE WBC PRESENT, PREDOMINANTLY MONONUCLEAR     RARE SQUAMOUS EPITHELIAL CELLS PRESENT     FEW GRAM POSITIVE COCCI IN CHAINS     IN PAIRS   Culture     Final    Value: FEW GROUP B STREP(S.AGALACTIAE)ISOLATED     Note: TESTING AGAINST S. AGALACTIAE NOT ROUTINELY PERFORMED DUE TO PREDICTABILITY OF AMP/PEN/VAN SUSCEPTIBILITY.   Report Status 07/02/2012 FINAL   Final   SURGICAL PCR SCREEN     Status: Normal   Collection Time   07/03/12  6:57 PM      Component Value Range Status Comment   MRSA, PCR NEGATIVE  NEGATIVE Final    Staphylococcus aureus NEGATIVE  NEGATIVE Final      Scheduled Meds:    . aspirin  81 mg Oral Daily  . chlorhexidine  60 mL Topical Once  . chlorhexidine  60 mL Topical Once  . enalapril  5 mg Oral BID  . enoxaparin (LOVENOX) injection  40 mg Subcutaneous Q24H  . FLUoxetine  20 mg Oral QHS  . folic acid  1 mg Oral Daily  . insulin aspart  0-15 Units Subcutaneous TID WC  . insulin aspart  0-5 Units Subcutaneous QHS  . [COMPLETED] insulin glargine  10 Units Subcutaneous Once  . insulin glargine  40 Units Subcutaneous Daily  . insulin glargine  60 Units Subcutaneous QHS  . levofloxacin (LEVAQUIN) IV  500 mg Intravenous Q24H  . metoprolol succinate  50 mg Oral Daily  . pantoprazole  40 mg Oral Daily  . simvastatin  40 mg Oral QPM  . topiramate  50 mg Oral QHS  . [DISCONTINUED] insulin glargine  27 Units Subcutaneous Daily  . [DISCONTINUED] insulin glargine  40 Units Subcutaneous QHS   Continuous Infusions:    . [DISCONTINUED]  sodium chloride 50 mL/hr at 07/04/12 2300  . [DISCONTINUED] lactated ringers 50 mL/hr at 07/05/12 0500     Meghan [name] A, MD  [facility] Pager [phone]  If 7PM-7AM, please contact night-coverage [email or web address] Password [email or web address] 07/06/2012, 10:17 AM   LOS: 6 days

## 2012-07-06 NOTE — Progress Notes (Signed)
Oneal Deputy to be D/C'd Home per MD order.  Discussed with the patient and all questions fully answered.   [place], Shope  Home Medication Instructions ZOX:[id number]   Printed on:07/06/12 1319  Medication Information                    metFORMIN (GLUCOPHAGE) 1000 MG tablet Take 1,000 mg by mouth 2 (two) times daily with a meal.           enalapril (VASOTEC) 5 MG tablet Take 5 mg by mouth 2 (two) times daily.           metoprolol succinate (TOPROL-XL) 50 MG 24 hr tablet Take 50 mg by mouth daily. Take with or immediately following a meal.           folic acid (FOLVITE) 1 MG tablet Take 1 mg by mouth daily.           pantoprazole (PROTONIX) 40 MG tablet Take 40 mg by mouth daily.           insulin glargine (LANTUS) 100 UNIT/ML injection Inject 64-84 Units into the skin 2 (two) times daily. Takes 64 units in the mornings and 84 units at night           topiramate (TOPAMAX) 50 MG tablet Take 50 mg by mouth at bedtime.           FLUoxetine (PROZAC) 20 MG capsule Take 20 mg by mouth at bedtime.           simvastatin (ZOCOR) 40 MG tablet Take 40 mg by mouth every evening.           aspirin 81 MG tablet Take 81 mg by mouth daily.           oxyCODONE (OXY IR/ROXICODONE) 5 MG immediate release tablet Take 0.5-1 tablets (2.5-5 mg total) by mouth every 6 (six) hours as needed for pain.             VVS, Skin clean, dry and intact without evidence of skin break down, no evidence of skin tears noted. IV catheter discontinued intact. Site without signs and symptoms of complications. Dressing and pressure applied.  An After Visit Summary was printed and given to the patient. Follow up appointments , new prescriptions and medication administration times given. Pt has PT HH with [facility] and wheelchair waiting at home. Patient escorted via WC, and D/C home via private auto.  Cindra Eves, RN 07/06/2012 1:19 PM

## 2012-07-06 NOTE — Discharge Summary (Signed)
Physician Discharge Summary  Meghan [name] ZOX:[id number] DOB: 10-08-1955 DOA: 06/30/2012  PCP: Ignatius Specking., MD  Admit date: 06/30/2012 Discharge date: 07/06/2012  Recommendations for Outpatient Follow-up:  Please followup with VYAS,DHRUV B., MD (PCP) in 1 week. Please followup with Dr. Lajoyce Corners in 1-2 weeks. Home health PT.  Discharge Diagnoses:  Principal Problem:  *Cellulitis of leg Active Problems:  History of gout  DM (diabetes mellitus), type 2, uncontrolled, periph vascular complic  HTN (hypertension)  ARF (acute renal failure)   Discharge Condition: Stable  Diet recommendation: Diabetic diet.  Filed Weights   06/30/12 1550 07/05/12 0542  Weight: 95.7 kg (210 lb 15.7 oz) 97.478 kg (214 lb 14.4 oz)    History of present illness:  Pt is a 56 y.o. female with past medical history of diabetes, hypertension and status post removal of second toe on right foot from diabetic complications who has had several days of increasing erythema, swelling and drainage from a blistering wound around her right great toe. She went to her primary care physician's office and was evaluated, but this was felt to be more related to gout and she was started on steroids. She was also on Bactrim for a UTI. In the meantime, patient continued with worsening erythema that started going up her leg along with increased swelling so she came to the emergency room. In ED, she was found have a white count of 16,000 and evaluation from the ER physician was requested for evaluation and management of cellulitis.   Hospital Course:  Cellulitis of leg  Appreciate ortho input. 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 Received 7 day course of Levaquin. WBC improved from yesterday. Per ortho, Dr. Lajoyce Corners does not need antibiotics at discharge. Arranged home health PT. Patient to weightbear on heel as needed with postoperative shoe in place. To followup with Dr. Lajoyce Corners in  2 weeks.  History of gout  Stable   DM (diabetes mellitus), type 2, uncontrolled, periph vascular complications  Improved. Continue to increase insulin dose. Given lantus 10 units SQ once. Resumed home regimen at discharge.   Anemia of chronic disease Hg and Hct stable.   HTN (hypertension)  Stable   ARF (acute renal failure)  Resolved.   Consultants:  Dr. Cleophas Dunker, orthopedic surgery on call for Dr.Duda  Procedures/Studies:  Mr Foot Right W Wo Contrast 07/03/2012 --> Cellulitis and skin ulceration with underlying myositis, No findings for septic arthritis, osteomyelitis, focal soft tissue absces Right metatarsal amputation 11/26 --> Dr. Lajoyce Corners  Antibiotics:  Completed 7 day course of levofloxacin on 07/06/2012.   Discharge Exam: Filed Vitals:   07/05/12 1005 07/05/12 1331 07/05/12 2041 07/06/12 0526  BP: 104/53 115/62 101/44 96/62  Pulse: 72 73 71 69  Temp:  98 F (36.7 C) 98.8 F (37.1 C) 98.4 F (36.9 C)  TempSrc:  Oral Oral Oral  Resp:  18 17 18   Height:      Weight:      SpO2:  99% 94% 94%   Discharge Instructions  Discharge Orders    Future Orders Please Complete By Expires   Diet Carb Modified      Increase activity slowly      Discharge instructions      Comments:   Please followup with VYAS,DHRUV B., MD (PCP) in 1 week. Please followup with Dr. Lajoyce Corners in 1-2 weeks. Home health PT.       Medication List     As of 07/06/2012 10:21 AM  STOP taking these medications         prednisoLONE 5 MG Tabs      sulfamethoxazole-trimethoprim 800-160 MG per tablet   Commonly known as: BACTRIM DS      TAKE these medications         aspirin 81 MG tablet   Take 81 mg by mouth daily.      enalapril 5 MG tablet   Commonly known as: VASOTEC   Take 5 mg by mouth 2 (two) times daily.      FLUoxetine 20 MG capsule   Commonly known as: PROZAC   Take 20 mg by mouth at bedtime.      folic acid 1 MG tablet   Commonly known as: FOLVITE   Take 1 mg by mouth  daily.      insulin glargine 100 UNIT/ML injection   Commonly known as: LANTUS   Inject 64-84 Units into the skin 2 (two) times daily. Takes 64 units in the mornings and 84 units at night      metFORMIN 1000 MG tablet   Commonly known as: GLUCOPHAGE   Take 1,000 mg by mouth 2 (two) times daily with a meal.      metoprolol succinate 50 MG 24 hr tablet   Commonly known as: TOPROL-XL   Take 50 mg by mouth daily. Take with or immediately following a meal.      oxyCODONE 5 MG immediate release tablet   Commonly known as: Oxy IR/ROXICODONE   Take 0.5-1 tablets (2.5-5 mg total) by mouth every 6 (six) hours as needed for pain.      pantoprazole 40 MG tablet   Commonly known as: PROTONIX   Take 40 mg by mouth daily.      simvastatin 40 MG tablet   Commonly known as: ZOCOR   Take 40 mg by mouth every evening.      topiramate 50 MG tablet   Commonly known as: TOPAMAX   Take 50 mg by mouth at bedtime.           Follow-up Information    Follow up with DUDA,MARCUS V, MD. Schedule an appointment as soon as possible for a visit in 2 weeks.   Contact information:   [address] [place] [address] [phone]       Follow up with VYAS,DHRUV B., MD. Schedule an appointment as soon as possible for a visit in 1 week.   Contact information:   [address] [place] [address] [phone]           The results of significant diagnostics from this hospitalization (including imaging, microbiology, ancillary and laboratory) are listed below for reference.    Significant Diagnostic Studies: Mr Foot Right W Wo Contrast  07/03/2012  *RADIOLOGY REPORT*  Clinical Data: Pain, swelling and medial foot ulcer.  MRI OF THE RIGHT FOREFOOT WITHOUT AND WITH CONTRAST  Technique:  Multiplanar, multisequence MR imaging was performed both before and after administration of intravenous contrast.  Contrast:  20 ml Multihance  Comparison: Radiographs 06/30/2012.  Findings: There are surgical  changes related to a previous partial second toe resection.  There is diffuse subcutaneous soft tissue swelling/edema and fluid mainly along the dorsum of the foot consistent with cellulitis.  No discrete soft tissue abscess.  No findings to suggest septic arthritis or osteomyelitis.  There is mild diffuse myositis involving the forefoot and midfoot but no findings for pyomyositis.  Focal cutaneous fluid collection overlying the second digit has the appearance  of a skin blister.  There appears to be a skin ulceration involving the medial aspect of the great toe along the proximal phalanx but no underlying discrete abscess.  IMPRESSION:  1.  Cellulitis and skin ulceration with underlying myositis. 2.  No findings for septic arthritis, osteomyelitis or focal soft tissue abscess.   Original Report Authenticated By: Rudie Meyer, M.D.    Dg Foot Complete Right  06/30/2012  *RADIOLOGY REPORT*  Clinical Data: Cellulitis of medial great toe  RIGHT FOOT COMPLETE - 3+ VIEW  Comparison: None.  Findings: The patient is status post second toe amputation at the base of the proximal phalanx.  No evidence for bony lysis in the great toe to suggest osteomyelitis.  There is no evidence for destructive changes in the remaining metatarsals or phalanges.  IMPRESSION: No radiographic evidence for osteomyelitis.   Original Report Authenticated By: Kennith Center, M.D.     Microbiology: Recent Results (from the past 240 hour(s))  WOUND CULTURE     Status: Normal   Collection Time   06/30/12  7:27 PM      Component Value Range Status Comment   Specimen Description WOUND TOE   Final    Special Requests NONE   Final    Gram Stain     Final    Value: RARE WBC PRESENT, PREDOMINANTLY MONONUCLEAR     RARE SQUAMOUS EPITHELIAL CELLS PRESENT     FEW GRAM POSITIVE COCCI IN CHAINS     IN PAIRS   Culture     Final    Value: FEW GROUP B STREP(S.AGALACTIAE)ISOLATED     Note: TESTING AGAINST S. AGALACTIAE NOT ROUTINELY PERFORMED DUE  TO PREDICTABILITY OF AMP/PEN/VAN SUSCEPTIBILITY.   Report Status 07/02/2012 FINAL   Final   SURGICAL PCR SCREEN     Status: Normal   Collection Time   07/03/12  6:57 PM      Component Value Range Status Comment   MRSA, PCR NEGATIVE  NEGATIVE Final    Staphylococcus aureus NEGATIVE  NEGATIVE Final      Labs: Basic Metabolic Panel:  Lab 07/06/12 4782 07/05/12 0510 07/04/12 0610 07/01/12 0600 06/30/12 1827 06/30/12 1211  NA 139 134* 136 137 -- 134*  K 4.0 4.9 4.4 4.7 -- 4.5  CL 103 100 101 104 -- 99  CO2 25 24 24 20  -- 21  GLUCOSE 147* 319* 151* 79 -- 370*  BUN 32* 30* 31* 30* -- 34*  CREATININE 1.14* 1.10 1.26* 1.30* 1.42* --  CALCIUM 9.7 9.0 9.8 9.2 -- 9.4  MG -- -- -- -- -- --  PHOS -- -- -- -- -- --   Liver Function Tests:  Lab 07/01/12 0600  AST 12  ALT 11  ALKPHOS 80  BILITOT 0.1*  PROT 7.5  ALBUMIN 2.7*   No results found for this basename: LIPASE:5,AMYLASE:5 in the last 168 hours No results found for this basename: AMMONIA:5 in the last 168 hours CBC:  Lab 07/06/12 0532 07/05/12 0510 07/04/12 0610 07/02/12 1110 06/30/12 1827 06/30/12 1211  WBC 11.7* 15.7* 16.3* 14.3* 14.9* --  NEUTROABS -- -- -- -- -- 13.0*  HGB 9.8* 8.8* 11.0* 9.9* 9.4* --  HCT 32.0* 28.1* 35.5* 31.2* 29.8* --  MCV 87.2 87.5 89.6 87.6 87.9 --  PLT 361 396 435* 375 337 --   Cardiac Enzymes: No results found for this basename: CKTOTAL:5,CKMB:5,CKMBINDEX:5,TROPONINI:5 in the last 168 hours BNP: BNP (last 3 results) No results found for this basename: PROBNP:3 in the last 8760 hours  CBG:  Lab 07/06/12 0756 07/05/12 2130 07/05/12 1714 07/05/12 1150 07/05/12 0747  GLUCAP 137* 204* 240* 339* 270*    Time spent: 25 minutes   Signed:  Amillion Scobee A  [facility] 07/06/2012, 10:21 AM

## 2012-07-10 ENCOUNTER — Encounter (HOSPITAL_COMMUNITY): Payer: Self-pay | Admitting: Orthopedic Surgery

## 2012-08-30 ENCOUNTER — Encounter: Payer: BC Managed Care – PPO | Admitting: Internal Medicine

## 2014-03-30 ENCOUNTER — Encounter (HOSPITAL_COMMUNITY): Payer: Self-pay | Admitting: Emergency Medicine

## 2014-03-30 ENCOUNTER — Emergency Department (HOSPITAL_COMMUNITY)
Admission: EM | Admit: 2014-03-30 | Discharge: 2014-04-01 | Disposition: A | Discharge status: Other Acute Inpatient Hospital | Payer: BC Managed Care – PPO | Attending: Emergency Medicine | Admitting: Emergency Medicine

## 2014-03-30 DIAGNOSIS — Z046 Encounter for general psychiatric examination, requested by authority: Secondary | ICD-10-CM | POA: Insufficient documentation

## 2014-03-30 DIAGNOSIS — F3289 Other specified depressive episodes: Secondary | ICD-10-CM | POA: Insufficient documentation

## 2014-03-30 DIAGNOSIS — I10 Essential (primary) hypertension: Secondary | ICD-10-CM | POA: Diagnosis not present

## 2014-03-30 DIAGNOSIS — Z794 Long term (current) use of insulin: Secondary | ICD-10-CM | POA: Diagnosis not present

## 2014-03-30 DIAGNOSIS — E7800 Pure hypercholesterolemia, unspecified: Secondary | ICD-10-CM | POA: Diagnosis not present

## 2014-03-30 DIAGNOSIS — E1149 Type 2 diabetes mellitus with other diabetic neurological complication: Secondary | ICD-10-CM | POA: Diagnosis not present

## 2014-03-30 DIAGNOSIS — Z7982 Long term (current) use of aspirin: Secondary | ICD-10-CM | POA: Diagnosis not present

## 2014-03-30 DIAGNOSIS — Z8544 Personal history of malignant neoplasm of other female genital organs: Secondary | ICD-10-CM | POA: Insufficient documentation

## 2014-03-30 DIAGNOSIS — Z872 Personal history of diseases of the skin and subcutaneous tissue: Secondary | ICD-10-CM | POA: Diagnosis not present

## 2014-03-30 DIAGNOSIS — Z3202 Encounter for pregnancy test, result negative: Secondary | ICD-10-CM | POA: Insufficient documentation

## 2014-03-30 DIAGNOSIS — Z87891 Personal history of nicotine dependence: Secondary | ICD-10-CM | POA: Diagnosis not present

## 2014-03-30 DIAGNOSIS — Z79899 Other long term (current) drug therapy: Secondary | ICD-10-CM | POA: Insufficient documentation

## 2014-03-30 DIAGNOSIS — F1210 Cannabis abuse, uncomplicated: Secondary | ICD-10-CM | POA: Insufficient documentation

## 2014-03-30 DIAGNOSIS — F1310 Sedative, hypnotic or anxiolytic abuse, uncomplicated: Secondary | ICD-10-CM | POA: Diagnosis not present

## 2014-03-30 DIAGNOSIS — F329 Major depressive disorder, single episode, unspecified: Secondary | ICD-10-CM | POA: Diagnosis not present

## 2014-03-30 DIAGNOSIS — E1142 Type 2 diabetes mellitus with diabetic polyneuropathy: Secondary | ICD-10-CM | POA: Diagnosis not present

## 2014-03-30 DIAGNOSIS — F32A Depression, unspecified: Secondary | ICD-10-CM

## 2014-03-30 LAB — ETHANOL: Alcohol, Ethyl (B): <11 mg/dL (ref 0–11)

## 2014-03-30 LAB — CBC WITH DIFFERENTIAL/PLATELET
BASOS PCT: 0 % (ref 0–1)
Basophils Absolute: 0 10*3/uL (ref 0.0–0.1)
Eosinophils Absolute: 0.9 10*3/uL — ABNORMAL HIGH (ref 0.0–0.7)
Eosinophils Relative: 8 % — ABNORMAL HIGH (ref 0–5)
HEMATOCRIT: 39.9 % (ref 36.0–46.0)
HEMOGLOBIN: 12.5 g/dL (ref 12.0–15.0)
LYMPHS ABS: 3 10*3/uL (ref 0.7–4.0)
LYMPHS PCT: 26 % (ref 12–46)
MCH: 27.4 pg (ref 26.0–34.0)
MCHC: 31.3 g/dL (ref 30.0–36.0)
MCV: 87.3 fL (ref 78.0–100.0)
MONO ABS: 0.9 10*3/uL (ref 0.1–1.0)
MONOS PCT: 8 % (ref 3–12)
NEUTROS ABS: 7 10*3/uL (ref 1.7–7.7)
NEUTROS PCT: 58 % (ref 43–77)
Platelets: 246 10*3/uL (ref 150–400)
RBC: 4.57 MIL/uL (ref 3.87–5.11)
RDW: 15.8 % — ABNORMAL HIGH (ref 11.5–15.5)
WBC: 11.8 10*3/uL — AB (ref 4.0–10.5)

## 2014-03-30 LAB — RAPID URINE DRUG SCREEN, HOSP PERFORMED
AMPHETAMINES: NOT DETECTED
BARBITURATES: NOT DETECTED
BENZODIAZEPINES: POSITIVE — AB
Cocaine: NOT DETECTED
Opiates: NOT DETECTED
Tetrahydrocannabinol: POSITIVE — AB

## 2014-03-30 LAB — COMPREHENSIVE METABOLIC PANEL
ALBUMIN: 4.5 g/dL (ref 3.5–5.2)
ALK PHOS: 95 U/L (ref 39–117)
ALT: 20 U/L (ref 0–35)
ANION GAP: 19 — AB (ref 5–15)
AST: 23 U/L (ref 0–37)
BILIRUBIN TOTAL: 0.4 mg/dL (ref 0.3–1.2)
BUN: 27 mg/dL — AB (ref 6–23)
CHLORIDE: 97 meq/L (ref 96–112)
CO2: 24 meq/L (ref 19–32)
Calcium: 10 mg/dL (ref 8.4–10.5)
Creatinine, Ser: 1.23 mg/dL — ABNORMAL HIGH (ref 0.50–1.10)
GFR calc Af Amer: 55 mL/min — ABNORMAL LOW (ref >90)
GFR, EST NON AFRICAN AMERICAN: 48 mL/min — AB (ref >90)
Glucose, Bld: 172 mg/dL — ABNORMAL HIGH (ref 70–99)
POTASSIUM: 4.2 meq/L (ref 3.7–5.3)
Sodium: 140 mEq/L (ref 137–147)
Total Protein: 9 g/dL — ABNORMAL HIGH (ref 6.0–8.3)

## 2014-03-30 LAB — SALICYLATE LEVEL: Salicylate Lvl: <2 mg/dL — ABNORMAL LOW (ref 2.8–20.0)

## 2014-03-30 LAB — ACETAMINOPHEN LEVEL

## 2014-03-30 LAB — PREGNANCY, URINE: PREG TEST UR: NEGATIVE

## 2014-03-30 MED ORDER — ASPIRIN EC 81 MG PO TBEC
81.0000 mg | DELAYED_RELEASE_TABLET | Freq: Every day | ORAL | Status: DC
  Administered 2014-03-30 – 2014-03-31 (×2): 81 mg via ORAL
  Filled 2014-03-30 (×2): qty 1

## 2014-03-30 MED ORDER — PANTOPRAZOLE SODIUM 40 MG PO TBEC
40.0000 mg | DELAYED_RELEASE_TABLET | Freq: Every day | ORAL | Status: DC
  Administered 2014-03-30 – 2014-03-31 (×2): 40 mg via ORAL
  Filled 2014-03-30 (×2): qty 1

## 2014-03-30 MED ORDER — FLUOXETINE HCL 20 MG PO CAPS
20.0000 mg | ORAL_CAPSULE | Freq: Every day | ORAL | Status: DC
  Administered 2014-03-30 – 2014-03-31 (×2): 20 mg via ORAL
  Filled 2014-03-30 (×2): qty 1

## 2014-03-30 MED ORDER — INSULIN GLARGINE 100 UNIT/ML ~~LOC~~ SOLN
84.0000 [IU] | Freq: Every day | SUBCUTANEOUS | Status: DC
  Administered 2014-03-30 – 2014-03-31 (×2): 84 [IU] via SUBCUTANEOUS
  Filled 2014-03-30 (×3): qty 0.84

## 2014-03-30 MED ORDER — TOPIRAMATE 25 MG PO TABS
50.0000 mg | ORAL_TABLET | Freq: Every day | ORAL | Status: DC
  Administered 2014-03-30 – 2014-03-31 (×2): 50 mg via ORAL
  Filled 2014-03-30 (×3): qty 2

## 2014-03-30 MED ORDER — METOPROLOL SUCCINATE ER 50 MG PO TB24
ORAL_TABLET | ORAL | Status: AC
  Filled 2014-03-30: qty 1

## 2014-03-30 MED ORDER — TOPIRAMATE 25 MG PO TABS
ORAL_TABLET | ORAL | Status: AC
  Filled 2014-03-30: qty 2

## 2014-03-30 MED ORDER — ENALAPRIL MALEATE 5 MG PO TABS
5.0000 mg | ORAL_TABLET | Freq: Two times a day (BID) | ORAL | Status: DC
  Administered 2014-03-30 – 2014-03-31 (×2): 5 mg via ORAL
  Filled 2014-03-30 (×6): qty 1

## 2014-03-30 MED ORDER — METFORMIN HCL 500 MG PO TABS
1000.0000 mg | ORAL_TABLET | Freq: Two times a day (BID) | ORAL | Status: DC
  Administered 2014-03-31 – 2014-04-01 (×3): 1000 mg via ORAL
  Filled 2014-03-30 (×3): qty 2

## 2014-03-30 MED ORDER — FOLIC ACID 1 MG PO TABS
1.0000 mg | ORAL_TABLET | Freq: Every day | ORAL | Status: DC
  Administered 2014-03-30 – 2014-03-31 (×2): 1 mg via ORAL
  Filled 2014-03-30 (×2): qty 1

## 2014-03-30 MED ORDER — INSULIN GLARGINE 100 UNIT/ML ~~LOC~~ SOLN
64.0000 [IU] | Freq: Every day | SUBCUTANEOUS | Status: DC
  Filled 2014-03-30 (×2): qty 0.64

## 2014-03-30 MED ORDER — INSULIN GLARGINE 100 UNIT/ML ~~LOC~~ SOLN: 64.0000 [IU] | Freq: Two times a day (BID) | SUBCUTANEOUS | Status: DC

## 2014-03-30 MED ORDER — ASPIRIN 81 MG PO TABS: 81.0000 mg | ORAL_TABLET | Freq: Every day | ORAL | Status: DC

## 2014-03-30 MED ORDER — SIMVASTATIN 10 MG PO TABS
40.0000 mg | ORAL_TABLET | Freq: Every evening | ORAL | Status: DC
  Administered 2014-03-30 – 2014-03-31 (×2): 40 mg via ORAL
  Filled 2014-03-30 (×2): qty 4

## 2014-03-30 MED ORDER — INSULIN ASPART 100 UNIT/ML ~~LOC~~ SOLN
0.0000 [IU] | Freq: Three times a day (TID) | SUBCUTANEOUS | Status: DC
  Administered 2014-03-31: 1 [IU] via SUBCUTANEOUS
  Administered 2014-03-31: 3 [IU] via SUBCUTANEOUS
  Administered 2014-04-01: 2 [IU] via SUBCUTANEOUS
  Filled 2014-03-30 (×3): qty 1

## 2014-03-30 MED ORDER — METOPROLOL SUCCINATE ER 50 MG PO TB24
50.0000 mg | ORAL_TABLET | Freq: Every day | ORAL | Status: DC
  Administered 2014-03-30 – 2014-03-31 (×2): 50 mg via ORAL
  Filled 2014-03-30 (×4): qty 1

## 2014-03-30 NOTE — ED Provider Notes (Signed)
CSN: [id number]     Arrival date & time 03/30/14  1915 History   First MD Initiated Contact with Patient 03/30/14 2003    This chart was scribed for Provider Default, MD by Terressa Koyanagi, ED Scribe. This patient was seen in room APA15/APA15 and the patient's care was started at 8:05 PM.  Chief Complaint  Patient presents with  . V70.1   The history is provided by the patient. No language interpreter was used.   HPI Comments: Meghan [name] is a 58 y.o. female, with medical Hx noted below, who presents to the Emergency Department for IVC initiated by her husband. Pt denies SI, any current pain, abd pain, HI, drug use, alcohol use. Pt, however, notes that while she is not suicidal she wishes some of her pain would be alleviated. Pt further reports taking 14 doses of 0.5 mg of Xanax yesterday and the day before. Pt denies taking any Xanax today. Pt reports taking her Metformin today.   Past Medical History  Diagnosis Date  . Vulvar cancer 2007  . Hypertension   . High cholesterol   . Type II diabetes mellitus   . Diabetic peripheral neuropathy   . Cellulitis 06/30/2012    RLE  . Depression    Past Surgical History  Procedure Laterality Date  . Amputation  ~ 2008    "2nd toe of right foot" (06/30/2012)  . Incision and drainage of wound  03/2007    "mediastinitis", left medial collarbone (06/30/2012)  . Lymphadenectomy  2007    "bilateral groins; had cancer vulva" (06/30/2012)  . Vulva surgery  2007    "took cancer off" (06/30/2012)  . Amputation  07/04/2012    Procedure: AMPUTATION RAY;  Surgeon: Newt Minion, MD;  Location: [place];  Service: Orthopedics;  Laterality: Right;  great toe  amp vs ray amp   No family history on file. History  Substance Use Topics  . Smoking status: Former Smoker -- 1.00 packs/day for 24 years    Types: Cigarettes  . Smokeless tobacco: Never Used     Comment: 06/30/2012 "quit cigarette smoking in 1999"  . Alcohol Use: Yes     Comment:  06/30/2012 "used to drink a few beer q now and then; last beer 10-12 yr ago"   OB History   Grav Para Term Preterm Abortions TAB SAB Ect Mult Living                 Review of Systems  A complete 10 system review of systems was obtained and all systems are negative except as noted in the HPI and PMH.    Allergies  Review of patient's allergies indicates no known allergies.  Home Medications   Prior to Admission medications   Medication Sig Start Date End Date Taking? Authorizing Provider  aspirin 81 MG tablet Take 81 mg by mouth daily.    Historical Provider, MD  enalapril (VASOTEC) 5 MG tablet Take 5 mg by mouth 2 (two) times daily.    Historical Provider, MD  FLUoxetine (PROZAC) 20 MG capsule Take 20 mg by mouth at bedtime.    Historical Provider, MD  folic acid (FOLVITE) 1 MG tablet Take 1 mg by mouth daily.    Historical Provider, MD  insulin glargine (LANTUS) 100 UNIT/ML injection Inject 64-84 Units into the skin 2 (two) times daily. Takes 64 units in the mornings and 84 units at night    Historical Provider, MD  metFORMIN (GLUCOPHAGE) 1000 MG tablet Take 1,000  mg by mouth 2 (two) times daily with a meal.    Historical Provider, MD  metoprolol succinate (TOPROL-XL) 50 MG 24 hr tablet Take 50 mg by mouth daily. Take with or immediately following a meal.    Historical Provider, MD  oxyCODONE (OXY IR/ROXICODONE) 5 MG immediate release tablet Take 0.5-1 tablets (2.5-5 mg total) by mouth every 6 (six) hours as needed for pain. 07/06/12   Srikar Janna Arch, MD  pantoprazole (PROTONIX) 40 MG tablet Take 40 mg by mouth daily.    Historical Provider, MD  simvastatin (ZOCOR) 40 MG tablet Take 40 mg by mouth every evening.    Historical Provider, MD  topiramate (TOPAMAX) 50 MG tablet Take 50 mg by mouth at bedtime.    Historical Provider, MD   Triage Vitals: BP 127/71  Pulse 104  Temp(Src) 99 F (37.2 C) (Oral)  Resp 20  Ht 5\' 7"  (1.702 m)  Wt 213 lb 1.6 oz (96.662 kg)  BMI 33.37 kg/m2   SpO2 97% Physical Exam  Nursing note and vitals reviewed. Constitutional: She is oriented to person, place, and time. She appears well-developed and well-nourished. No distress.  HENT:  Head: Normocephalic and atraumatic.  Mouth/Throat: Oropharynx is clear and moist. No oropharyngeal exudate.  Eyes: Conjunctivae and EOM are normal. Pupils are equal, round, and reactive to light.  Neck: Normal range of motion. Neck supple.  No meningismus.  Cardiovascular: Normal rate, regular rhythm, normal heart sounds and intact distal pulses.   No murmur heard. Pulmonary/Chest: Effort normal and breath sounds normal. No respiratory distress.  Abdominal: Soft. There is no tenderness. There is no rebound and no guarding.  Musculoskeletal: Normal range of motion. She exhibits no edema and no tenderness.  Neurological: She is alert and oriented to person, place, and time. No cranial nerve deficit. She exhibits normal muscle tone. Coordination normal.  No ataxia on finger to nose bilaterally. No pronator drift. 5/5 strength throughout. CN 2-12 intact. Negative Romberg. Equal grip strength. Sensation intact. Gait is normal.   Skin: Skin is warm.  Psychiatric: She has a normal mood and affect. Her behavior is normal.  Flat affect    ED Course  Procedures (including critical care time) DIAGNOSTIC STUDIES: Oxygen Saturation is 97% on RA, nl by my interpretation.    COORDINATION OF CARE: 8:09 PM-Discussed treatment plan which includes labs with pt at bedside and pt agreed to plan.   Labs Review Labs Reviewed  CBC WITH DIFFERENTIAL - Abnormal; Notable for the following:    WBC 11.8 (*)    RDW 15.8 (*)    Eosinophils Relative 8 (*)    Eosinophils Absolute 0.9 (*)    All other components within normal limits  COMPREHENSIVE METABOLIC PANEL - Abnormal; Notable for the following:    Glucose, Bld 172 (*)    BUN 27 (*)    Creatinine, Ser 1.23 (*)    Total Protein 9.0 (*)    GFR calc non Af Amer 48 (*)     GFR calc Af Amer 55 (*)    Anion gap 19 (*)    All other components within normal limits  URINE RAPID DRUG SCREEN (HOSP PERFORMED) - Abnormal; Notable for the following:    Benzodiazepines POSITIVE (*)    Tetrahydrocannabinol POSITIVE (*)    All other components within normal limits  SALICYLATE LEVEL - Abnormal; Notable for the following:    Salicylate Lvl <5.3 (*)    All other components within normal limits  CBG MONITORING, ED -  Abnormal; Notable for the following:    Glucose-Capillary 104 (*)    All other components within normal limits  ETHANOL  PREGNANCY, URINE  ACETAMINOPHEN LEVEL    Imaging Review No results found.   EKG Interpretation None      MDM   Final diagnoses:  Depression   IVC from home.  Per husband, patient has bee overwhelmed and emotional and abusing his xanax.  Patient denies that taking xanax was a suicide attempt.  She took 14 0.5 mg yesterday and 14 the day before that.  Denies any other drug or alcohol use. Admits to stressful home situation and "wants the pain to end".  No SI or HI at this time.  Screening labs remarkable for hyperglycemia.  Holding orders placed and insulin sliding scale.  Medically clear for psychiatry evaluation. Inpatient treatment recommended.  IVC continued.  I personally performed the services described in this documentation, which was scribed in my presence. The recorded information has been reviewed and is accurate.   Ezequiel Essex, MD 03/31/14 [phone]

## 2014-03-30 NOTE — ED Notes (Signed)
Patient here on an IVC that was initiated by her husband. Patient states that she has been taking his xanax 0.5 mg (around 30) since Thursday. Patient states that she is not really wanting to harm herself, states that she wants out of the situation that she is in. IVC papers state that she sent a group text message to her family this morning stating that she wanted to end it all and be an angel looking over her grandchild. Patient states that her granddaughter is in the hospital and is really sick.
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
## 2014-03-30 NOTE — BH Assessment (Signed)
Received call for assessment. Spoke to Dr. Ezequiel Essex who said Pt has been taking 7 mg of her husband's Xanax in order to deal with family stress. Pt's husband petitioned for IVC. Tele-assessment will be initiated.  Orpah Greek Rosana Hoes, [facility] Triage Specialist [phone]

## 2014-03-30 NOTE — ED Notes (Signed)
Notified patient's husband that psych recommended admission.l

## 2014-03-30 NOTE — BH Assessment (Signed)
Assessment complete. Meghan [name], [facility] at [facility], confirms adult unit is currently at capacity. [facility] observation unit cannot admit IVC patients. Gave clinical report to Serena Colonel, NP who states although Pt is currently denying suicidal ideation her actions indicate she meets criteria for inpatient psychiatric treatment. TTS will contact other facilities for placement. Notified Dr. Ezequiel Essex and Margarita Grizzle, RN of recommendation.  Orpah Greek Rosana Hoes, [facility] Triage Specialist [phone]

## 2014-03-31 LAB — CBG MONITORING, ED
GLUCOSE-CAPILLARY: 104 mg/dL — AB (ref 70–99)
Glucose-Capillary: 136 mg/dL — ABNORMAL HIGH (ref 70–99)
Glucose-Capillary: 215 mg/dL — ABNORMAL HIGH (ref 70–99)
Glucose-Capillary: 249 mg/dL — ABNORMAL HIGH (ref 70–99)

## 2014-03-31 NOTE — BH Assessment (Signed)
Per Lavell Luster, [facility] at [facility], adult unit is currently at capacity. Contacted the following facilities for placement:   BED AVAILABLE, FAXED CLINICAL INFORMATION:  [facility], per [place], per [facility]   AT CAPACITY:  [facility], per [facility], per Oswaldo Done, per [facility], per [facility], per [facility], per [facility], per [facility], per [facility], per [facility], per [facility], per [facility], per [facility], per Annabell Howells, per [facility], per [place]   NO RESPONSE:  [facility]    [place], [place], [facility]  Triage Specialist  [phone]

## 2014-03-31 NOTE — BH Assessment (Signed)
Per [place] pt has been accepted to [facility] under the care of Dr. Harland German and can arrive after 0830 on 04-01-14. Nurse can call report to [phone].  Informed Roselyn Reef, RN of the plan.   Lear Ng, [facility] Triage Specialist 03/31/2014 10:01 PM

## 2014-03-31 NOTE — ED Provider Notes (Signed)
  Physical Exam  BP 105/64  Pulse 100  Temp(Src) 99 F (37.2 C) (Oral)  Resp 20  Ht 5\' 7"  (1.702 m)  Wt 213 lb 1.6 oz (96.662 kg)  BMI 33.37 kg/m2  SpO2 97%  Physical Exam  ED Course  Procedures  MDM Under IVC from husband. Did take overdose of Xanax. Pending placement.      Jasper Riling. Alvino Chapel, [place] 03/31/14 [phone]

## 2014-03-31 NOTE — ED Notes (Signed)
CBG 104 

## 2014-03-31 NOTE — Progress Notes (Signed)
Referral faxed to the following gero-psych facilities with bed availability:  [facility] per [facility]- per Marc Morgans Disposition MHT

## 2014-04-01 LAB — CBG MONITORING, ED: Glucose-Capillary: 190 mg/dL — ABNORMAL HIGH (ref 70–99)
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
## 2014-04-01 NOTE — ED Notes (Signed)
Report given to Aleen Sells, RN at [facility]. Report number [phone]. Dr. Harland German accepting MD.

## 2014-04-16 ENCOUNTER — Encounter (HOSPITAL_COMMUNITY): Payer: Self-pay | Admitting: Psychiatry

## 2014-04-16 ENCOUNTER — Ambulatory Visit (INDEPENDENT_AMBULATORY_CARE_PROVIDER_SITE_OTHER): Payer: BC Managed Care – PPO | Admitting: Psychiatry

## 2014-04-16 VITALS — BP 101/58 | HR 72 | Ht 67.0 in | Wt 214.2 lb

## 2014-04-16 DIAGNOSIS — F329 Major depressive disorder, single episode, unspecified: Secondary | ICD-10-CM

## 2014-04-16 DIAGNOSIS — F322 Major depressive disorder, single episode, severe without psychotic features: Secondary | ICD-10-CM

## 2014-04-16 MED ORDER — DULOXETINE HCL 60 MG PO CPEP: 60.0000 mg | ORAL_CAPSULE | Freq: Every day | ORAL | Status: DC

## 2014-04-16 NOTE — Progress Notes (Signed)
Psychiatric Assessment Adult  Patient Identification:  Meghan [name] Date of Evaluation:  04/16/2014 Chief Complaint: recent overdose History of Chief Complaint:   Chief Complaint  Patient presents with  . Depression  . Establish Care    HPI this patient is a 58 year old married white female who lives with her husband in [place]. She has one son and 2 stepsons. She is currently applying for disability.  The patient was referred by [facility]. She was hospitalized there from August 24 to 04/05/2014 after taking a drug overdose of Xanax and opana.  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 She's been married to her current husband for 20 years. He's currently having an affair with a younger woman and she found out about this last May. He actually left from June 26 throughJuly 29 and he apparently was staying with this other woman. The patient also had a very good friend died in 08-Apr-2023.  On the day of the overdose the patient claimed she was not suicidal that she had sent out texts to her friends indicating that she was depressed. She took an overdose of her husband Xanax and opana in order to "get away from the stress" while in [place] they increased her Prozac from 20-60 mg  Daily but it has not helped and she's not able to tolerate a higher dose because it makes her too drowsy. She still crying quite a bit and feels sad. She has some difficulty sleeping. She denies anxiety or panic. Her energy is low. She feels that her husband is still not engaged in the marriage and she has caught him talking to the other woman several times on the phone. She is thinking about separating from him.  The patient had vulvar cancer in 2009 and since then has not felt like being sexually active. She thinks this may have contributed to the breakdown  in the marriage. She does not use drugs or alcohol but was smoking marijuana prior to this recent admission but claims she is stopped. Review of Systems  Constitutional: Positive for activity change.  HENT: Negative.   Eyes: Negative.   Respiratory: Negative.   Cardiovascular: Negative.   Gastrointestinal: Negative.   Endocrine: Negative.   Genitourinary: Negative.   Musculoskeletal: Positive for gait problem.  Skin: Negative.   Allergic/Immunologic: Negative.   Neurological: Positive for numbness.  Hematological: Negative.   Psychiatric/Behavioral: Positive for sleep disturbance and dysphoric mood.   Physical Exam not done Depressive Symptoms: depressed mood, anhedonia, insomnia, psychomotor retardation, fatigue, feelings of worthlessness/guilt, hopelessness, suicidal attempt, decreased labido,  (Hypo) Manic Symptoms:   Elevated Mood:  No Irritable Mood:  Yes Grandiosity:  No Distractibility:  No Labiality of Mood:  No Delusions:  No Hallucinations:  No Impulsivity:  No Sexually Inappropriate Behavior:  No Financial Extravagance:  No Flight of Ideas:  No  Anxiety Symptoms: Excessive Worry:  Yes Panic Symptoms:  No Agoraphobia:  No Obsessive Compulsive: No  Symptoms: None, Specific Phobias:  No Social Anxiety:  No  Psychotic Symptoms:  Hallucinations: No None Delusions:  No Paranoia:  No   Ideas of Reference:  No  PTSD Symptoms: Ever had a traumatic exposure:  No Had a traumatic exposure in the last month:  No Re-experiencing: No None Hypervigilance:  No Hyperarousal: No None Avoidance: No None  Traumatic Brain Injury: Yes  recent fall  Past Psychiatric History: Diagnosis: Maj. depression   Hospitalizations: Last month at [place]: None   Substance Abuse Care: None   Self-Mutilation: None   Suicidal Attempts: Once last month   Violent Behaviors: none   Past Medical History:   Past Medical History  Diagnosis Date  .  Vulvar cancer 2007  . Hypertension   . High cholesterol   . Type II diabetes mellitus   . Diabetic peripheral neuropathy   . Cellulitis 06/30/2012    RLE  . Depression    History of Loss of Consciousness:  No Seizure History:  No Cardiac History:  No Allergies:  No Known Allergies Current Medications:  Current Outpatient Prescriptions  Medication Sig Dispense Refill  . aspirin EC 325 MG tablet Take 325 mg by mouth daily.      . Canagliflozin (INVOKANA) 100 MG TABS Take 1 tablet by mouth daily.      . enalapril (VASOTEC) 5 MG tablet Take 5 mg by mouth 2 (two) times daily.      . insulin aspart (NOVOLOG) 100 UNIT/ML injection Inject 15-24 Units into the skin 3 (three) times daily before meals. Sliding Scale.      . insulin glargine (LANTUS) 100 UNIT/ML injection Inject 80 Units into the skin at bedtime.       . metFORMIN (GLUCOPHAGE) 1000 MG tablet Take 1,000 mg by mouth 2 (two) times daily with a meal.      . metoprolol (LOPRESSOR) 50 MG tablet Take 1 tablet by mouth daily.      . pantoprazole (PROTONIX) 40 MG tablet Take 40 mg by mouth daily.      . pregabalin (LYRICA) 25 MG capsule Take 25 mg by mouth 3 (three) times daily.      . rosuvastatin (CRESTOR) 40 MG tablet Take 40 mg by mouth daily.      . DULoxetine (CYMBALTA) 60 MG capsule Take 1 capsule (60 mg total) by mouth daily.  30 capsule  2   No current facility-administered medications for this visit.    Previous Psychotropic Medications:  Medication Dose   Prozac 20 mg                        Substance Abuse History in the last 12 months: Substance Age of 1st Use Last Use Amount Specific Type  Nicotine      Alcohol      Cannabis    was using marijuana until a few weeks ago    Opiates      Cocaine      Methamphetamines      LSD      Ecstasy      Benzodiazepines      Caffeine      Inhalants      Others:                          Medical Consequences of Substance Abuse: none  Legal Consequences of  Substance Abuse: none  Family Consequences of Substance Abuse: none  Blackouts:  No DT's:  No Withdrawal Symptoms:  No None  Social History: Current Place of Residence: [place] of Birth: [place] Family Members: Adopted as a baby, has one adoptive sister Marital Status:  Married Children:   Sons: 1  Daughters:  Relationships:  Education:  Administrator, sports Problems/Performance:  Religious Beliefs/Practices: Unknown History of Abuse: none Pensions consultant;  Therapist, nutritional History:  None. Legal History: none Hobbies/Interests: None  Family History:   Family History  Problem Relation Age of Onset  . Adopted: Yes    Mental Status Examination/Evaluation: Objective:  Appearance: Casual and Fairly Groomed  Eye Contact::  Fair  Speech:  Slow  Volume:  Decreased  Mood:  Depressed and tearful   Affect:  Constricted and Depressed  Thought Process:  Goal Directed  Orientation:  Full (Time, Place, and Person)  Thought Content:  Rumination  Suicidal Thoughts:  No  Homicidal Thoughts:  No  Judgement:  Good  Insight:  Fair  Psychomotor Activity:  Decreased  Akathisia:  No  Handed:  Right  AIMS (if indicated):    Assets:  Communication Skills Desire for Improvement Resilience Social Support    Laboratory/X-Ray Psychological Evaluation(s)   Sent from [facility] and reviewed     Assessment:  Axis I: Major Depression, single episode  AXIS I Major Depression, single episode  AXIS II Deferred  AXIS III Past Medical History  Diagnosis Date  . Vulvar cancer 2007  . Hypertension   . High cholesterol   . Type II diabetes mellitus   . Diabetic peripheral neuropathy   . Cellulitis 06/30/2012    RLE  . Depression      AXIS IV problems with primary support group  AXIS V 41-50 serious symptoms   Treatment Plan/Recommendations:  Plan of Care: Medication management   Laboratory:   Psychotherapy: She has been assigned a  therapist here   Medications: She will discontinue Prozac and start Cymbalta 60 mg each bedtime which may help with both depression and neuropathy   Routine PRN Medications:  No  Consultations:   Safety Concerns:  She denies thoughts of hurting self or other   Other:  She'll return in [place], [place], MD 9/8/20152:18 PM

## 2014-04-24 ENCOUNTER — Ambulatory Visit (INDEPENDENT_AMBULATORY_CARE_PROVIDER_SITE_OTHER): Payer: BC Managed Care – PPO | Admitting: Psychology

## 2014-04-24 ENCOUNTER — Encounter (HOSPITAL_COMMUNITY): Payer: Self-pay | Admitting: Psychology

## 2014-04-24 DIAGNOSIS — F322 Major depressive disorder, single episode, severe without psychotic features: Secondary | ICD-10-CM

## 2014-04-24 NOTE — Progress Notes (Signed)
PROGRESS NOTE  Patient:  Meghan [name]   DOB: [birth year]  MR Number: [id number]  Location: [facility]- [address] [place] [address] Dept: [phone]  Start: 10 AM End: 10:55 AM  Provider/Observer:     Edgardo Roys PSYD  Chief Complaint:      Chief Complaint  Patient presents with  . Depression  . Stress    Reason For Service:     The patient was referred by Dr. loss after the patient was referred to our clinic from [facility] psychiatric facility. The patient had a recent suicidal gesture event in which she took a handful of her husband's Xanax and while she does not remember at likely also took his Opana. The patient reports that things are just built up over time. She had a history of depression to some degree following medical issues she also a situation where her husband began having an affair with a 15 something-year-old woman. Her husband left home from a pinning and troponin were July with his young woman spending the families savings before returning back to her. It was during this period of time a year ago taking an overdose. The patient has a number of medical issues including history of cancer and bone infection as well as type 2 diabetes. 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 Currently, the patient denies any suicidal ideation or impulses. She reports that she wasn't actually trying to kill herself but just escape from the situation and get back to her husband. She reports that she does acknowledge that this was a very dangerous than he acknowledges that her light was in danger.   Interventions Strategy:  Cognitive/behavioral psychotherapeutic interventions   Participation Level:   Active  Participation Quality:  Appropriate      Behavioral Observation:  Fairly Groomed, Alert, and Appropriate.   Current Psychosocial Factors: The  patient reports that her husband has not come back home and they are doing okay together. There are some issues of resentment that she still has been arguing with the financial fall out of his activities. She is not sure whether she wants to remain married to him but is taking her time trying to figure this out.  Content of Session:   Reviewed her symptoms and work on therapeutic interventions around issues related to depression and helping her feel better coping skills to deal with current situation.   Current Status:   The patient reports that her depression has been improved somewhat. She is now taking Cymbalta which doesn't appear to cause this much drowsiness as the Prozac it for her. However, she reports that she is still feeling very fatigued and we talked about some of the interventions that may be the for this.  Patient Progress:   Stable   Target Goals:   Target goals include reducing intensity, severity, and duration of depressive symptoms. The patient contracts for safety and one of the goals that she continues to contract for safety.   Last Reviewed:    04/24/2014  Goals Addressed Today:     Goals address what to do with coping with and dealing with recurrent symptoms of depression including feeling helpless and hopeless and isolation.  Impression/Diagnosis:    The patient has a history of depression in the past and was taking Prozac previously. The patient has been dealing with medical issues related to type 2 diabetes, cancer and  a number of other complications from type 2 diabetes. The patient and her husband began having difficulties and he had an affair exacerbating the patient's depression. The patient is actively participating in therapeutic interventions at this point.   Diagnosis:    Axis I: Major depressive disorder, single episode, severe without psychotic features    Meghan [name] R, PsyD 04/24/2014

## 2014-05-09 ENCOUNTER — Ambulatory Visit (HOSPITAL_COMMUNITY): Payer: BC Managed Care – PPO | Admitting: Psychiatry

## 2014-05-16 ENCOUNTER — Encounter (HOSPITAL_COMMUNITY): Payer: Self-pay | Admitting: Psychiatry

## 2014-05-16 ENCOUNTER — Ambulatory Visit (INDEPENDENT_AMBULATORY_CARE_PROVIDER_SITE_OTHER): Payer: BC Managed Care – PPO | Admitting: Psychiatry

## 2014-05-16 VITALS — BP 120/63 | HR 95 | Ht 67.0 in | Wt 212.8 lb

## 2014-05-16 DIAGNOSIS — F322 Major depressive disorder, single episode, severe without psychotic features: Secondary | ICD-10-CM

## 2014-05-16 DIAGNOSIS — F329 Major depressive disorder, single episode, unspecified: Secondary | ICD-10-CM

## 2014-05-16 MED ORDER — DULOXETINE HCL 60 MG PO CPEP: 60.0000 mg | ORAL_CAPSULE | Freq: Every day | ORAL | Status: DC

## 2014-05-16 NOTE — Progress Notes (Signed)
Patient ID: Meghan [name], female   DOB: 11/19/1955, 58 y.o.   MRN: [id number]  Psychiatric Assessment Adult  Patient Identification:  Meghan [name] Date of Evaluation:  05/16/2014 Chief Complaint: recent overdose History of Chief Complaint:   Chief Complaint  Patient presents with  . Anxiety  . Depression  . Follow-up    Anxiety     this patient is a 58 year old married white female who lives with her husband in [place]. She has one son and 2 stepsons. She is currently applying for disability.  The patient was referred by [facility]. She was hospitalized there from August 24 to 04/05/2014 after taking a drug overdose of Xanax and opana.  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 She's been married to her current husband for 20 years. He's currently having an affair with a younger woman and she found out about this last May. He actually left from June 26 throughJuly 29 and he apparently was staying with this other woman. The patient also had a very good friend died in 2023/03/31.  On the day of the overdose the patient claimed she was not suicidal that she had sent out texts to her friends indicating that she was depressed. She took an overdose of her husband Xanax and opana in order to "get away from the stress" while in [place] they increased her Prozac from 20-60 mg  Daily but it has not helped and she's not able to tolerate a higher dose because it makes her too drowsy. She still crying quite a bit and feels sad. She has some difficulty sleeping. She denies anxiety or panic. Her energy is low. She feels that her husband is still not engaged in the marriage and she has caught him talking to the other woman several times on the phone. She is thinking about separating from him.  The patient had vulvar cancer in 2009 and  since then has not felt like being sexually active. She thinks this may have contributed to the breakdown in the marriage. She does not use drugs or alcohol but was smoking marijuana prior to this recent admission but claims she is stopped.   The patient returns after 4 weeks. Her mood is improved. She still very suspicious that her husband may be seen a younger woman. This woman is involved in abusing drugs and her husband has been giving her money. The patient thinks that all of this is over but she's not entirely sure. Until she gets her disability she cannot move out. She's very realistic about all this. She's no longer suicidal but claims she's constantly tired. I reviewed her labs and her blood sugar is not under the best control and she's not had a thyroid panel checked and I suggested this be done by her primary Dr. I also suggested that she move her metoprolol to bedtime Review of Systems  Constitutional: Positive for activity change.  HENT: Negative.   Eyes: Negative.   Respiratory: Negative.   Cardiovascular: Negative.   Gastrointestinal: Negative.   Endocrine: Negative.   Genitourinary: Negative.   Musculoskeletal: Positive for gait problem.  Skin: Negative.   Allergic/Immunologic: Negative.   Neurological: Positive for numbness.  Hematological: Negative.   Psychiatric/Behavioral: Positive for sleep disturbance and dysphoric mood.   Physical Exam not done Depressive Symptoms: depressed mood, anhedonia, insomnia, psychomotor  retardation, fatigue, feelings of worthlessness/guilt, hopelessness, suicidal attempt, decreased labido,  (Hypo) Manic Symptoms:   Elevated Mood:  No Irritable Mood:  Yes Grandiosity:  No Distractibility:  No Labiality of Mood:  No Delusions:  No Hallucinations:  No Impulsivity:  No Sexually Inappropriate Behavior:  No Financial Extravagance:  No Flight of Ideas:  No  Anxiety Symptoms: Excessive Worry:  Yes Panic Symptoms:  No Agoraphobia:   No Obsessive Compulsive: No  Symptoms: None, Specific Phobias:  No Social Anxiety:  No  Psychotic Symptoms:  Hallucinations: No None Delusions:  No Paranoia:  No   Ideas of Reference:  No  PTSD Symptoms: Ever had a traumatic exposure:  No Had a traumatic exposure in the last month:  No Re-experiencing: No None Hypervigilance:  No Hyperarousal: No None Avoidance: No None  Traumatic Brain Injury: Yes recent fall  Past Psychiatric History: Diagnosis: Maj. depression   Hospitalizations: Last month at [place]: None   Substance Abuse Care: None   Self-Mutilation: None   Suicidal Attempts: Once last month   Violent Behaviors: none   Past Medical History:   Past Medical History  Diagnosis Date  . Vulvar cancer 2007  . Hypertension   . High cholesterol   . Type II diabetes mellitus   . Diabetic peripheral neuropathy   . Cellulitis 06/30/2012    RLE  . Depression    History of Loss of Consciousness:  No Seizure History:  No Cardiac History:  No Allergies:  No Known Allergies Current Medications:  Current Outpatient Prescriptions  Medication Sig Dispense Refill  . aspirin EC 325 MG tablet Take 325 mg by mouth daily.      . Canagliflozin (INVOKANA) 100 MG TABS Take 1 tablet by mouth daily.      . DULoxetine (CYMBALTA) 60 MG capsule Take 1 capsule (60 mg total) by mouth daily.  30 capsule  2  . enalapril (VASOTEC) 5 MG tablet Take 5 mg by mouth 2 (two) times daily.      . insulin aspart (NOVOLOG) 100 UNIT/ML injection Inject 15-24 Units into the skin 3 (three) times daily before meals. Sliding Scale.      . insulin glargine (LANTUS) 100 UNIT/ML injection Inject 80 Units into the skin at bedtime.       . metFORMIN (GLUCOPHAGE) 1000 MG tablet Take 1,000 mg by mouth 2 (two) times daily with a meal.      . metoprolol (LOPRESSOR) 50 MG tablet Take 1 tablet by mouth daily.      . pantoprazole (PROTONIX) 40 MG tablet Take 40 mg by mouth daily.      .  pregabalin (LYRICA) 25 MG capsule Take 25 mg by mouth 3 (three) times daily.      . rosuvastatin (CRESTOR) 40 MG tablet Take 40 mg by mouth daily.       No current facility-administered medications for this visit.    Previous Psychotropic Medications:  Medication Dose   Prozac 20 mg                        Substance Abuse History in the last 12 months: Substance Age of 1st Use Last Use Amount Specific Type  Nicotine      Alcohol      Cannabis    was using marijuana until a few weeks ago    Opiates      Cocaine      Methamphetamines  LSD      Ecstasy      Benzodiazepines      Caffeine      Inhalants      Others:                          Medical Consequences of Substance Abuse: none  Legal Consequences of Substance Abuse: none  Family Consequences of Substance Abuse: none  Blackouts:  No DT's:  No Withdrawal Symptoms:  No None  Social History: Current Place of Residence: [place] of Birth: [place] Family Members: Adopted as a baby, has one adoptive sister Marital Status:  Married Children:   Sons: 1  Daughters:  Relationships:  Education:  Administrator, sports Problems/Performance:  Religious Beliefs/Practices: Unknown History of Abuse: none Pensions consultant; Therapist, nutritional History:  None. Legal History: none Hobbies/Interests: None  Family History:   Family History  Problem Relation Age of Onset  . Adopted: Yes    Mental Status Examination/Evaluation: Objective:  Appearance: Casual and Fairly Groomed  Eye Contact::  Fair  Speech:  Slow  Volume:  Decreased  Mood:  Fairly good   Affect: Brighter   Thought Process:  Goal Directed  Orientation:  Full (Time, Place, and Person)  Thought Content:  Rumination  Suicidal Thoughts:  No  Homicidal Thoughts:  No  Judgement:  Good  Insight:  Fair  Psychomotor Activity:  Normal today but feels tired all the time   Akathisia:  No  Handed:  Right   AIMS (if indicated):    Assets:  Communication Skills Desire for Improvement Resilience Social Support    Laboratory/X-Ray Psychological Evaluation(s)   Sent from [facility] and reviewed     Assessment:  Axis I: Major Depression, single episode  AXIS I Major Depression, single episode  AXIS II Deferred  AXIS III Past Medical History  Diagnosis Date  . Vulvar cancer 2007  . Hypertension   . High cholesterol   . Type II diabetes mellitus   . Diabetic peripheral neuropathy   . Cellulitis 06/30/2012    RLE  . Depression      AXIS IV problems with primary support group  AXIS V 41-50 serious symptoms   Treatment Plan/Recommendations:  Plan of Care: Medication management   Laboratory:   Psychotherapy: She has been assigned a therapist here   Medications: She will continue Cymbalta 60 mg each bedtime which may help with both depression and neuropathy   Routine PRN Medications:  No  Consultations:   Safety Concerns:  She denies thoughts of hurting self or other   Other:  She'll return in 6 weeks    Levonne Spiller, MD 10/8/20153:34 PM

## 2014-05-17 ENCOUNTER — Ambulatory Visit (HOSPITAL_COMMUNITY): Payer: Self-pay | Admitting: Psychology

## 2014-06-03 ENCOUNTER — Ambulatory Visit (INDEPENDENT_AMBULATORY_CARE_PROVIDER_SITE_OTHER): Payer: BC Managed Care – PPO | Admitting: Psychology

## 2014-06-03 DIAGNOSIS — F322 Major depressive disorder, single episode, severe without psychotic features: Secondary | ICD-10-CM

## 2014-06-27 ENCOUNTER — Ambulatory Visit (INDEPENDENT_AMBULATORY_CARE_PROVIDER_SITE_OTHER): Payer: BC Managed Care – PPO | Admitting: Psychiatry

## 2014-06-27 ENCOUNTER — Encounter (HOSPITAL_COMMUNITY): Payer: Self-pay | Admitting: Psychiatry

## 2014-06-27 VITALS — BP 120/60 | Ht 67.0 in | Wt 221.0 lb

## 2014-06-27 DIAGNOSIS — F329 Major depressive disorder, single episode, unspecified: Secondary | ICD-10-CM

## 2014-06-27 DIAGNOSIS — F322 Major depressive disorder, single episode, severe without psychotic features: Secondary | ICD-10-CM

## 2014-06-27 MED ORDER — DULOXETINE HCL 60 MG PO CPEP: 60.0000 mg | ORAL_CAPSULE | Freq: Every day | ORAL | Status: DC

## 2014-06-27 NOTE — Progress Notes (Signed)
Patient ID: Meghan [name], female   DOB: [birth year], 58 y.o.   MRN: [id number] Patient ID: Meghan [name], female   DOB: Jul 03, 1956, 58 y.o.   MRN: [id number]  Psychiatric Assessment Adult  Patient Identification:  Meghan [name] Date of Evaluation:  06/27/2014 Chief Complaint: recent overdose History of Chief Complaint:   Chief Complaint  Patient presents with  . Depression  . Anxiety  . Follow-up    Anxiety     this patient is a 58 year old married white female who lives with her husband in [place]. She has one son and 2 stepsons. She is currently applying for disability.  The patient was referred by [facility]. She was hospitalized there from August 24 to 04/05/2014 after taking a drug overdose of Xanax and opana.  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 She's been married to her current husband for 20 years. He's currently having an affair with a younger woman and she found out about this last May. He actually left from June 26 throughJuly 29 and he apparently was staying with this other woman. The patient also had a very good friend died in Mar 25, 2023.  On the day of the overdose the patient claimed she was not suicidal that she had sent out texts to her friends indicating that she was depressed. She took an overdose of her husband Xanax and opana in order to "get away from the stress" while in [place] they increased her Prozac from 20-60 mg  Daily but it has not helped and she's not able to tolerate a higher dose because it makes her too drowsy. She still crying quite a bit and feels sad. She has some difficulty sleeping. She denies anxiety or panic. Her energy is low. She feels that her husband is still not engaged in the marriage and she has caught him talking to the other woman several times on the  phone. She is thinking about separating from him.  The patient had vulvar cancer in 2009 and since then has not felt like being sexually active. She thinks this may have contributed to the breakdown in the marriage. She does not use drugs or alcohol but was smoking marijuana prior to this recent admission but claims she is stopped.   The patient returns after 4 weeks. Her mood is improved. Her husband has stopped seeing the younger woman he was involved with. In fact the woman is now in jail. Her husband seems more interested in the marriage and helping around the home. The patient still feels tired but she is going to have a physical next month. She often sleeps 10-11 hours at night. She denies any further suicidal ideation Review of Systems  Constitutional: Positive for activity change.  HENT: Negative.   Eyes: Negative.   Respiratory: Negative.   Cardiovascular: Negative.   Gastrointestinal: Negative.   Endocrine: Negative.   Genitourinary: Negative.   Musculoskeletal: Positive for gait problem.  Skin: Negative.   Allergic/Immunologic: Negative.   Neurological: Positive for numbness.  Hematological: Negative.   Psychiatric/Behavioral: Positive for sleep disturbance and dysphoric mood.   Physical Exam not done Depressive Symptoms: depressed mood, anhedonia, insomnia, psychomotor retardation, fatigue, feelings of worthlessness/guilt, hopelessness, suicidal attempt, decreased labido,  (Hypo) Manic Symptoms:   Elevated Mood:  No Irritable Mood:  Yes Grandiosity:  No Distractibility:  No Labiality  of Mood:  No Delusions:  No Hallucinations:  No Impulsivity:  No Sexually Inappropriate Behavior:  No Financial Extravagance:  No Flight of Ideas:  No  Anxiety Symptoms: Excessive Worry:  Yes Panic Symptoms:  No Agoraphobia:  No Obsessive Compulsive: No  Symptoms: None, Specific Phobias:  No Social Anxiety:  No  Psychotic Symptoms:  Hallucinations: No None Delusions:   No Paranoia:  No   Ideas of Reference:  No  PTSD Symptoms: Ever had a traumatic exposure:  No Had a traumatic exposure in the last month:  No Re-experiencing: No None Hypervigilance:  No Hyperarousal: No None Avoidance: No None  Traumatic Brain Injury: Yes recent fall  Past Psychiatric History: Diagnosis: Maj. depression   Hospitalizations: Last month at [facility]: None   Substance Abuse Care: None   Self-Mutilation: None   Suicidal Attempts: Once last month   Violent Behaviors: none   Past Medical History:   Past Medical History  Diagnosis Date  . Vulvar cancer 2007  . Hypertension   . High cholesterol   . Type II diabetes mellitus   . Diabetic peripheral neuropathy   . Cellulitis 06/30/2012    RLE  . Depression    History of Loss of Consciousness:  No Seizure History:  No Cardiac History:  No Allergies:  No Known Allergies Current Medications:  Current Outpatient Prescriptions  Medication Sig Dispense Refill  . aspirin EC 325 MG tablet Take 325 mg by mouth daily.    . Canagliflozin (INVOKANA) 100 MG TABS Take 1 tablet by mouth daily.    . DULoxetine (CYMBALTA) 60 MG capsule Take 1 capsule (60 mg total) by mouth daily. 30 capsule 2  . enalapril (VASOTEC) 5 MG tablet Take 5 mg by mouth 2 (two) times daily.    . insulin aspart (NOVOLOG) 100 UNIT/ML injection Inject 15-24 Units into the skin 3 (three) times daily before meals. Sliding Scale.    . insulin glargine (LANTUS) 100 UNIT/ML injection Inject 80 Units into the skin at bedtime.     . metFORMIN (GLUCOPHAGE) 1000 MG tablet Take 1,000 mg by mouth 2 (two) times daily with a meal.    . metoprolol (LOPRESSOR) 50 MG tablet Take 1 tablet by mouth daily.    . pantoprazole (PROTONIX) 40 MG tablet Take 40 mg by mouth daily.    . pregabalin (LYRICA) 25 MG capsule Take 25 mg by mouth 3 (three) times daily.    . rosuvastatin (CRESTOR) 40 MG tablet Take 40 mg by mouth daily.    Marland Kitchen topiramate (TOPAMAX)  50 MG tablet daily.     No current facility-administered medications for this visit.    Previous Psychotropic Medications:  Medication Dose   Prozac 20 mg                        Substance Abuse History in the last 12 months: Substance Age of 1st Use Last Use Amount Specific Type  Nicotine      Alcohol      Cannabis    was using marijuana until a few weeks ago    Opiates      Cocaine      Methamphetamines      LSD      Ecstasy      Benzodiazepines      Caffeine      Inhalants      Others:  Medical Consequences of Substance Abuse: none  Legal Consequences of Substance Abuse: none  Family Consequences of Substance Abuse: none  Blackouts:  No DT's:  No Withdrawal Symptoms:  No None  Social History: Current Place of Residence: [place] of Birth: [place] Family Members: Adopted as a baby, has one adoptive sister Marital Status:  Married Children:   Sons: 1  Daughters:  Relationships:  Education:  Administrator, sports Problems/Performance:  Religious Beliefs/Practices: Unknown History of Abuse: none Pensions consultant; Therapist, nutritional History:  None. Legal History: none Hobbies/Interests: None  Family History:   Family History  Problem Relation Age of Onset  . Adopted: Yes    Mental Status Examination/Evaluation: Objective:  Appearance: Casual and Fairly Groomed  Eye Contact::  Fair  Speech:  Slow  Volume:  Decreased  Mood:  Fairly good   Affect: Brighter   Thought Process:  Goal Directed  Orientation:  Full (Time, Place, and Person)  Thought Content:  Rumination  Suicidal Thoughts:  No  Homicidal Thoughts:  No  Judgement:  Good  Insight:  Fair  Psychomotor Activity:  Normal today but feels tired all the time   Akathisia:  No  Handed:  Right  AIMS (if indicated):    Assets:  Communication Skills Desire for Improvement Resilience Social Support    Laboratory/X-Ray  Psychological Evaluation(s)   Sent from [facility] and reviewed     Assessment:  Axis I: Major Depression, single episode  AXIS I Major Depression, single episode  AXIS II Deferred  AXIS III Past Medical History  Diagnosis Date  . Vulvar cancer 2007  . Hypertension   . High cholesterol   . Type II diabetes mellitus   . Diabetic peripheral neuropathy   . Cellulitis 06/30/2012    RLE  . Depression      AXIS IV problems with primary support group  AXIS V 41-50 serious symptoms   Treatment Plan/Recommendations:  Plan of Care: Medication management   Laboratory:   Psychotherapy: She has been assigned a therapist here   Medications: She will continue Cymbalta 60 mg each bedtime which may help with both depression and neuropathy   Routine PRN Medications:  No  Consultations:   Safety Concerns:  She denies thoughts of hurting self or others   Other:  She'll return in 2 months     Levonne Spiller, MD 11/19/20153:48 PM

## 2014-07-01 ENCOUNTER — Encounter (HOSPITAL_COMMUNITY): Payer: Self-pay | Admitting: Psychology

## 2014-07-01 ENCOUNTER — Ambulatory Visit (INDEPENDENT_AMBULATORY_CARE_PROVIDER_SITE_OTHER): Payer: BC Managed Care – PPO | Admitting: Psychology

## 2014-07-01 DIAGNOSIS — F322 Major depressive disorder, single episode, severe without psychotic features: Secondary | ICD-10-CM

## 2014-07-01 NOTE — Progress Notes (Signed)
PROGRESS NOTE  Patient:  Meghan [name]   DOB: 1956/07/20  MR Number: [id number]  Location: [facility] [address] [place] [address] Dept: [phone]  Start: 2 PM End: 3 PM  Provider/Observer:     Edgardo Roys PSYD  Chief Complaint:      Chief Complaint  Patient presents with  . Anxiety  . Depression    Reason For Service:     The patient was referred by Dr. loss after the patient was referred to our clinic from [facility] facility. The patient had a recent suicidal gesture event in which she took a handful of her husband's Xanax and while she does not remember at likely also took his Opana. The patient reports that things are just built up over time. She had a history of depression to some degree following medical issues she also a situation where her husband began having an affair with a 47 something-year-old woman. Her husband left home from a pinning and troponin were July with his young woman spending the families savings before returning back to her. It was during this period of time a year ago taking an overdose. The patient has a number of medical issues including history of cancer and bone infection as well as type 2 diabetes. 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 Currently, the patient denies any suicidal ideation or impulses. She reports that she wasn't actually trying to kill herself but just escape from the situation and get back to her husband. She reports that she does acknowledge that this was a very dangerous than he acknowledges that her light was in danger.   Interventions Strategy:  Cognitive/behavioral psychotherapeutic interventions   Participation Level:   Active  Participation Quality:  Appropriate      Behavioral Observation:  Fairly Groomed, Alert, and Appropriate.   Current Psychosocial Factors: The  patient reports that her husband is doing much better and marriage is going better.  Patient has come to conclusion that she has everything needed to make it.  Content of Session:   Reviewed her symptoms and work on therapeutic interventions around issues related to depression and helping her feel better coping skills to deal with current situation.   Current Status:   The patient reports that her depression has been improved somewhat. She is now taking Cymbalta which doesn't appear to cause this much drowsiness as the Prozac it for her. However, she reports that she is still feeling very fatigued and we talked about some of the interventions that may be the for this.  Patient Progress:   Stable   Target Goals:   Target goals include reducing intensity, severity, and duration of depressive symptoms. The patient contracts for safety and one of the goals that she continues to contract for safety.   Last Reviewed:    07/01/2014  Goals Addressed Today:     Goals address what to do with coping with and dealing with recurrent symptoms of depression including feeling helpless and hopeless and isolation.  Impression/Diagnosis:    The patient has a history of depression in the past and was taking Prozac previously. The patient has been dealing with medical issues related to type 2 diabetes, cancer and a number of other complications from type 2 diabetes. The patient and her husband began having difficulties and he had an affair exacerbating the patient's depression. The patient is  actively participating in therapeutic interventions at this point.   Diagnosis:    Axis I: No diagnosis found.    Edgardo Roys, PsyD 07/01/2014

## 2014-07-17 ENCOUNTER — Encounter (HOSPITAL_COMMUNITY): Payer: Self-pay | Admitting: Psychology

## 2014-07-17 NOTE — Progress Notes (Signed)
PROGRESS NOTE  Patient:  Meghan [name]   DOB: April 02, 1956  MR Number: [id number]  Location: [facility] [address] [place] [address] Dept: [phone]  Start: 1 PM End: 2 PM  Provider/Observer:     Edgardo Roys PSYD  Chief Complaint:      Chief Complaint  Patient presents with  . Depression    Reason For Service:     The patient was referred by Dr. loss after the patient was referred to our clinic from [facility] psychiatric facility. The patient had a recent suicidal gesture event in which she took a handful of her husband's Xanax and while she does not remember at likely also took his Opana. The patient reports that things are just built up over time. She had a history of depression to some degree following medical issues she also a situation where her husband began having an affair with a 30 something-year-old woman. Her husband left home from a pinning and troponin were July with his young woman spending the families savings before returning back to her. It was during this period of time a year ago taking an overdose. The patient has a number of medical issues including history of cancer and bone infection as well as type 2 diabetes. 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 Currently, the patient denies any suicidal ideation or impulses. She reports that she wasn't actually trying to kill herself but just escape from the situation and get back to her husband. She reports that she does acknowledge that this was a very dangerous than he acknowledges that her light was in danger.   Interventions Strategy:  Cognitive/behavioral psychotherapeutic interventions   Participation Level:   Active  Participation Quality:  Appropriate      Behavioral Observation:  Fairly Groomed, Alert, and Appropriate.   Current Psychosocial Factors: The patient reports  that her husband has not come back home and they are doing okay together. There are some issues of resentment that she still has been arguing with the financial fall out of his activities. She is not sure whether she wants to remain married to him but is taking her time trying to figure this out. The patient is still trying to cope with all the issues between her and her husband but reports that she is working on her ability to be independent if need be.  Content of Session:   Reviewed her symptoms and work on therapeutic interventions around issues related to depression and helping her feel better coping skills to deal with current situation.   Current Status:   The patient reports that her depression has been improved somewhat. She is now taking Cymbalta which doesn't appear to cause this much drowsiness as the Prozac it for her. However, she reports that she is still feeling very fatigued and we talked about some of the interventions that may be the for this.  Patient Progress:   Stable   Target Goals:   Target goals include reducing intensity, severity, and duration of depressive symptoms. The patient contracts for safety and one of the goals that she continues to contract for safety.   Last Reviewed:    06/03/2014  Goals Addressed Today:     Goals address what to do with coping with and dealing with recurrent symptoms of depression including feeling helpless and hopeless and isolation.  Impression/Diagnosis:    The  patient has a history of depression in the past and was taking Prozac previously. The patient has been dealing with medical issues related to type 2 diabetes, cancer and a number of other complications from type 2 diabetes. The patient and her husband began having difficulties and he had an affair exacerbating the patient's depression. The patient is actively participating in therapeutic interventions at this point.   Diagnosis:    Axis I: Major depressive disorder, single episode,  severe without psychotic features    RODENBOUGH,JOHN R, PsyD 07/17/2014

## 2014-07-26 ENCOUNTER — Encounter (HOSPITAL_COMMUNITY): Payer: Self-pay | Admitting: Psychology

## 2014-07-26 ENCOUNTER — Ambulatory Visit (INDEPENDENT_AMBULATORY_CARE_PROVIDER_SITE_OTHER): Payer: BC Managed Care – PPO | Admitting: Psychology

## 2014-07-26 DIAGNOSIS — F322 Major depressive disorder, single episode, severe without psychotic features: Secondary | ICD-10-CM

## 2014-07-26 NOTE — Progress Notes (Signed)
PROGRESS NOTE  Patient:  Meghan [name]   DOB: January 20, 1956  MR Number: [id number]  Location: [facility] [address] [place] [address] Dept: [phone]  Start: 9 AM End: 10 AM  Provider/Observer:     Edgardo Roys PSYD  Chief Complaint:      Chief Complaint  Patient presents with  . Depression    Reason For Service:     The patient was referred by Dr. loss after the patient was referred to our clinic from [facility] psychiatric facility. The patient had a recent suicidal gesture event in which she took a handful of her husband's Xanax and while she does not remember at likely also took his Opana. The patient reports that things are just built up over time. She had a history of depression to some degree following medical issues she also a situation where her husband began having an affair with a 64 something-year-old woman. Her husband left home from a pinning and troponin were July with his young woman spending the families savings before returning back to her. It was during this period of time a year ago taking an overdose. The patient has a number of medical issues including history of cancer and bone infection as well as type 2 diabetes. 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 Currently, the patient denies any suicidal ideation or impulses. She reports that she wasn't actually trying to kill herself but just escape from the situation and get back to her husband. She reports that she does acknowledge that this was a very dangerous than he acknowledges that her light was in danger.   Interventions Strategy:  Cognitive/behavioral psychotherapeutic interventions   Participation Level:   Active  Participation Quality:  Appropriate      Behavioral Observation:  Fairly Groomed, Alert, and Appropriate.   Current Psychosocial Factors: The patient  reports that she has been doing better overall and especially better than she was during the worst of the depression she reports that xmas time is difficult for her dealing with her finicial situation and feeling like she can not get much for her grandkids and the fact that her mother is ill and suffering from dementia.  Content of Session:   Reviewed her symptoms and work on therapeutic interventions around issues related to depression and helping her feel better coping skills to deal with current situation.   Current Status:   The patient reports that her depression continues to improve and she is actively working on further Therapist, occupational.  Patient Progress:   Stable   Target Goals:   Target goals include reducing intensity, severity, and duration of depressive symptoms. The patient contracts for safety and one of the goals that she continues to contract for safety.   Last Reviewed:    07/26/2014  Goals Addressed Today:     Goals address what to do with coping with and dealing with recurrent symptoms of depression including feeling helpless and hopeless and isolation.  Impression/Diagnosis:    The patient has a history of depression in the past and was taking Prozac previously. The patient has been dealing with medical issues related to type 2 diabetes, cancer and a number of other complications from type 2 diabetes. The patient and her husband began having difficulties and he had an affair exacerbating the patient's depression. The patient is actively participating in therapeutic interventions at  this point.   Diagnosis:    Axis I: Major depressive disorder, single episode, severe without psychotic features    RODENBOUGH,JOHN R, PsyD 07/26/2014

## 2014-08-27 ENCOUNTER — Encounter (HOSPITAL_COMMUNITY): Payer: Self-pay | Admitting: Psychiatry

## 2014-08-27 ENCOUNTER — Ambulatory Visit (INDEPENDENT_AMBULATORY_CARE_PROVIDER_SITE_OTHER): Payer: 59 | Admitting: Psychiatry

## 2014-08-27 VITALS — BP 119/74 | HR 93 | Ht 67.0 in | Wt 220.0 lb

## 2014-08-27 DIAGNOSIS — F322 Major depressive disorder, single episode, severe without psychotic features: Secondary | ICD-10-CM

## 2014-08-27 MED ORDER — DULOXETINE HCL 60 MG PO CPEP: 60.0000 mg | ORAL_CAPSULE | Freq: Every day | ORAL | Status: DC

## 2014-08-27 NOTE — Progress Notes (Signed)
Patient ID: DEVONNA OBOYLE, female   DOB: 15-Apr-1956, 59 y.o.   MRN: [id number] Patient ID: CALYSSA ZOBRIST, female   DOB: 1955/09/07, 59 y.o.   MRN: [id number] Patient ID: KABREA SEENEY, female   DOB: [birth year], 59 y.o.   MRN: [id number]  Psychiatric Assessment Adult  Patient Identification:  LYANA ASBILL Date of Evaluation:  08/27/2014 Chief Complaint: recent overdose History of Chief Complaint:   Chief Complaint  Patient presents with  . Depression  . Follow-up    Anxiety     this patient is a 59 year old married white female who lives with her husband in [place]. She has one son and 2 stepsons. She is currently applying for disability.  The patient was referred by [facility]. She was hospitalized there from August 24 to 04/05/2014 after taking a drug overdose of Xanax and opana.  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 She's been married to her current husband for 20 years. He's currently having an affair with a younger woman and she found out about this last May. He actually left from June 26 throughJuly 29 and he apparently was staying with this other woman. The patient also had a very good friend died in April 24, 2023.  On the day of the overdose the patient claimed she was not suicidal that she had sent out texts to her friends indicating that she was depressed. She took an overdose of her husband Xanax and opana in order to "get away from the stress" while in [place] they increased her Prozac from 20-60 mg  Daily but it has not helped and she's not able to tolerate a higher dose because it makes her too drowsy. She still crying quite a bit and feels sad. She has some difficulty sleeping. She denies anxiety or panic. Her energy is low. She feels that her husband is still not engaged in the marriage  and she has caught him talking to the other woman several times on the phone. She is thinking about separating from him.  The patient had vulvar cancer in 2009 and since then has not felt like being sexually active. She thinks this may have contributed to the breakdown in the marriage. She does not use drugs or alcohol but was smoking marijuana prior to this recent admission but claims she is stopped.   The patient returns after 2 months. She stopped taking her Cymbalta about one week ago because she was worried that she would not be able to pay for it with her new insurance plan. Since she stopped it she's been crying a lot and feeling sad. She denies suicidal ideation. She still having a lot of conflicts with her husband who continues to drink and she feels that he ignores her. She no longer can afford the counseling here so I suggested she join a church or go to [employer]. She agrees to go back on the Cymbalta and I explained that we would help her with the financial issues such as getting the medicine pre-authorized Review of Systems  Constitutional: Positive for activity change.  HENT: Negative.   Eyes: Negative.   Respiratory: Negative.   Cardiovascular: Negative.   Gastrointestinal: Negative.   Endocrine: Negative.   Genitourinary: Negative.   Musculoskeletal: Positive for gait problem.  Skin: Negative.   Allergic/Immunologic: Negative.   Neurological: Positive for  numbness.  Hematological: Negative.   Psychiatric/Behavioral: Positive for sleep disturbance and dysphoric mood.   Physical Exam not done Depressive Symptoms: depressed mood, anhedonia, insomnia, psychomotor retardation, fatigue, feelings of worthlessness/guilt, hopelessness, suicidal attempt, decreased labido,  (Hypo) Manic Symptoms:   Elevated Mood:  No Irritable Mood:  Yes Grandiosity:  No Distractibility:  No Labiality of Mood:  No Delusions:  No Hallucinations:  No Impulsivity:  No Sexually Inappropriate  Behavior:  No Financial Extravagance:  No Flight of Ideas:  No  Anxiety Symptoms: Excessive Worry:  Yes Panic Symptoms:  No Agoraphobia:  No Obsessive Compulsive: No  Symptoms: None, Specific Phobias:  No Social Anxiety:  No  Psychotic Symptoms:  Hallucinations: No None Delusions:  No Paranoia:  No   Ideas of Reference:  No  PTSD Symptoms: Ever had a traumatic exposure:  No Had a traumatic exposure in the last month:  No Re-experiencing: No None Hypervigilance:  No Hyperarousal: No None Avoidance: No None  Traumatic Brain Injury: Yes recent fall  Past Psychiatric History: Diagnosis: Maj. depression   Hospitalizations: Last month at [place]: None   Substance Abuse Care: None   Self-Mutilation: None   Suicidal Attempts: Once last month   Violent Behaviors: none   Past Medical History:   Past Medical History  Diagnosis Date  . Vulvar cancer 2007  . Hypertension   . High cholesterol   . Type II diabetes mellitus   . Diabetic peripheral neuropathy   . Cellulitis 06/30/2012    RLE  . Depression    History of Loss of Consciousness:  No Seizure History:  No Cardiac History:  No Allergies:  No Known Allergies Current Medications:  Current Outpatient Prescriptions  Medication Sig Dispense Refill  . aspirin EC 325 MG tablet Take 325 mg by mouth daily.    . Canagliflozin (INVOKANA) 100 MG TABS Take 1 tablet by mouth daily.    . DULoxetine (CYMBALTA) 60 MG capsule Take 1 capsule (60 mg total) by mouth daily. 30 capsule 2  . enalapril (VASOTEC) 5 MG tablet Take 5 mg by mouth 2 (two) times daily.    . insulin aspart (NOVOLOG) 100 UNIT/ML injection Inject 15-24 Units into the skin 3 (three) times daily before meals. Sliding Scale.    . insulin glargine (LANTUS) 100 UNIT/ML injection Inject 80 Units into the skin at bedtime.     . metFORMIN (GLUCOPHAGE) 1000 MG tablet Take 1,000 mg by mouth 2 (two) times daily with a meal.    . metoprolol  (LOPRESSOR) 50 MG tablet Take 1 tablet by mouth daily.    . pantoprazole (PROTONIX) 40 MG tablet Take 40 mg by mouth daily.    . pregabalin (LYRICA) 25 MG capsule Take 25 mg by mouth 3 (three) times daily.    . rosuvastatin (CRESTOR) 40 MG tablet Take 40 mg by mouth daily.    Marland Kitchen topiramate (TOPAMAX) 50 MG tablet daily.     No current facility-administered medications for this visit.    Previous Psychotropic Medications:  Medication Dose   Prozac 20 mg                        Substance Abuse History in the last 12 months: Substance Age of 1st Use Last Use Amount Specific Type  Nicotine      Alcohol      Cannabis    was using marijuana until a few weeks ago    Opiates  Cocaine      Methamphetamines      LSD      Ecstasy      Benzodiazepines      Caffeine      Inhalants      Others:                          Medical Consequences of Substance Abuse: none  Legal Consequences of Substance Abuse: none  Family Consequences of Substance Abuse: none  Blackouts:  No DT's:  No Withdrawal Symptoms:  No None  Social History: Current Place of Residence: [place] of Birth: [place] Family Members: Adopted as a baby, has one adoptive sister Marital Status:  Married Children:   Sons: 1  Daughters:  Relationships:  Education:  Administrator, sports Problems/Performance:  Religious Beliefs/Practices: Unknown History of Abuse: none Pensions consultant; Therapist, nutritional History:  None. Legal History: none Hobbies/Interests: None  Family History:   Family History  Problem Relation Age of Onset  . Adopted: Yes    Mental Status Examination/Evaluation: Objective:  Appearance: Casual and Fairly Groomed  Eye Contact::  Fair  Speech:  Slow  Volume:  Decreased  Mood:  More depressed   Affect: Tearful   Thought Process:  Goal Directed  Orientation:  Full (Time, Place, and Person)  Thought Content:  Rumination  Suicidal  Thoughts:  No  Homicidal Thoughts:  No  Judgement:  Good  Insight:  Fair  Psychomotor Activity:  Normal today but feels tired all the time   Akathisia:  No  Handed:  Right  AIMS (if indicated):    Assets:  Communication Skills Desire for Improvement Resilience Social Support    Laboratory/X-Ray Psychological Evaluation(s)   Sent from [facility] and reviewed     Assessment:  Axis I: Major Depression, single episode  AXIS I Major Depression, single episode  AXIS II Deferred  AXIS III Past Medical History  Diagnosis Date  . Vulvar cancer 2007  . Hypertension   . High cholesterol   . Type II diabetes mellitus   . Diabetic peripheral neuropathy   . Cellulitis 06/30/2012    RLE  . Depression      AXIS IV problems with primary support group  AXIS V 41-50 serious symptoms   Treatment Plan/Recommendations:  Plan of Care: Medication management   Laboratory:   Psychotherapy: She has been assigned a therapist here   Medications: She will continue Cymbalta 60 mg each bedtime which may help with both depression and neuropathy   Routine PRN Medications:  No  Consultations:   Safety Concerns:  She denies thoughts of hurting self or others   Other:  She'll return in 6 weeks     Claudia Alvizo, MD 1/19/201610:50 AM

## 2014-08-30 ENCOUNTER — Ambulatory Visit (HOSPITAL_COMMUNITY): Payer: Self-pay | Admitting: Psychology

## 2014-09-23 ENCOUNTER — Ambulatory Visit (HOSPITAL_COMMUNITY): Payer: Self-pay | Admitting: Psychology

## 2014-09-25 ENCOUNTER — Other Ambulatory Visit (HOSPITAL_COMMUNITY): Payer: Self-pay | Admitting: Psychiatry

## 2014-09-25 ENCOUNTER — Telehealth (HOSPITAL_COMMUNITY): Payer: Self-pay | Admitting: *Deleted

## 2014-09-25 MED ORDER — VENLAFAXINE HCL 75 MG PO TABS: 75.0000 mg | ORAL_TABLET | Freq: Two times a day (BID) | ORAL | Status: DC

## 2014-09-25 NOTE — Telephone Encounter (Signed)
Pt called stating she is not able to afford her Cymbalta. Per pt, she went to her pharmacy and they stated her pay for this medication is $80.00. Per pt, her pharmacist informed her that Effexor 75 mg QD is a possibility and it would cost her $10.00. Per pt, she can afford the $10.00 better the the $80.00. Per pt she would like to see if Dr. Harrington Challenger could put her on medication that is a little cheaper then Cymbalta but does the same thing. Pt number is [phone].

## 2014-09-25 NOTE — Telephone Encounter (Signed)
Tell her Effexor 75 mg bid was sent in

## 2014-09-26 NOTE — Telephone Encounter (Signed)
Pt is aware and shows understanding 

## 2014-10-08 ENCOUNTER — Ambulatory Visit (INDEPENDENT_AMBULATORY_CARE_PROVIDER_SITE_OTHER): Payer: 59 | Admitting: Psychiatry

## 2014-10-08 ENCOUNTER — Encounter (HOSPITAL_COMMUNITY): Payer: Self-pay | Admitting: Psychiatry

## 2014-10-08 VITALS — BP 108/62 | HR 79 | Ht 67.0 in | Wt 215.0 lb

## 2014-10-08 DIAGNOSIS — F329 Major depressive disorder, single episode, unspecified: Secondary | ICD-10-CM

## 2014-10-08 DIAGNOSIS — F322 Major depressive disorder, single episode, severe without psychotic features: Secondary | ICD-10-CM

## 2014-10-08 MED ORDER — VENLAFAXINE HCL 75 MG PO TABS: 75.0000 mg | ORAL_TABLET | Freq: Two times a day (BID) | ORAL | Status: DC

## 2014-10-08 NOTE — Progress Notes (Signed)
Patient ID: Meghan [name], female   DOB: 28-Feb-1956, 59 y.o.   MRN: [id number] Patient ID: Meghan [name], female   DOB: [birth year], 59 y.o.   MRN: [id number] Patient ID: Meghan [name], female   DOB: [birth year], 59 y.o.   MRN: [id number] Patient ID: Meghan [name], female   DOB: 08-Dec-1955, 59 y.o.   MRN: [id number]  Psychiatric Assessment Adult  Patient Identification:  Meghan [name] Date of Evaluation:  10/08/2014 Chief Complaint: "I'm doing much better" History of Chief Complaint:   Chief Complaint  Patient presents with  . Depression  . Anxiety  . Follow-up    Anxiety     this patient is a 59 year old married white female who lives with her husband in [place]. She has one son and 2 stepsons. She is currently applying for disability.  The patient was referred by [facility]. She was hospitalized there from August 24 to 04/05/2014 after taking a drug overdose of Xanax and opana.  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 She's been married to her current husband for 20 years. He's currently having an affair with a younger woman and she found out about this last May. He actually left from June 26 throughJuly 29 and he apparently was staying with this other woman. The patient also had a very good friend died in May 05, 2023.  On the day of the overdose the patient claimed she was not suicidal that she had sent out texts to her friends indicating that she was depressed. She took an overdose of her husband Xanax and opana in order to "get away from the stress" while in [place] they increased her Prozac from 20-60 mg  Daily but it has not helped and she's not able to tolerate a higher dose because it makes her too drowsy. She still crying quite a bit and feels sad. She has some difficulty sleeping. She  denies anxiety or panic. Her energy is low. She feels that her husband is still not engaged in the marriage and she has caught him talking to the other woman several times on the phone. She is thinking about separating from him.  The patient had vulvar cancer in 2009 and since then has not felt like being sexually active. She thinks this may have contributed to the breakdown in the marriage. She does not use drugs or alcohol but was smoking marijuana prior to this recent admission but claims she is stopped.   The patient returns after 3 months. She is doing much better. Her insurance, he would not pay for Cymbalta so she is taking Effexor and doing well. Her mood is good and she is sleeping well at night. Her husband has made a big turnaround and is very engaged in the marriage now and drinking only minimally. She is much happier Review of Systems  Constitutional: Positive for activity change.  HENT: Negative.   Eyes: Negative.   Respiratory: Negative.   Cardiovascular: Negative.   Gastrointestinal: Negative.   Endocrine: Negative.   Genitourinary: Negative.   Musculoskeletal: Positive for gait problem.  Skin: Negative.   Allergic/Immunologic: Negative.   Neurological: Positive for numbness.  Hematological: Negative.   Psychiatric/Behavioral: Positive for sleep disturbance and dysphoric mood.   Physical Exam not done Depressive Symptoms: depressed mood, anhedonia, insomnia, psychomotor retardation, fatigue, feelings of worthlessness/guilt, hopelessness, suicidal  attempt, decreased labido,  (Hypo) Manic Symptoms:   Elevated Mood:  No Irritable Mood:  Yes Grandiosity:  No Distractibility:  No Labiality of Mood:  No Delusions:  No Hallucinations:  No Impulsivity:  No Sexually Inappropriate Behavior:  No Financial Extravagance:  No Flight of Ideas:  No  Anxiety Symptoms: Excessive Worry:  Yes Panic Symptoms:  No Agoraphobia:  No Obsessive Compulsive: No  Symptoms:  None, Specific Phobias:  No Social Anxiety:  No  Psychotic Symptoms:  Hallucinations: No None Delusions:  No Paranoia:  No   Ideas of Reference:  No  PTSD Symptoms: Ever had a traumatic exposure:  No Had a traumatic exposure in the last month:  No Re-experiencing: No None Hypervigilance:  No Hyperarousal: No None Avoidance: No None  Traumatic Brain Injury: Yes recent fall  Past Psychiatric History: Diagnosis: Maj. depression   Hospitalizations: Last month at [place]: None   Substance Abuse Care: None   Self-Mutilation: None   Suicidal Attempts: Once last month   Violent Behaviors: none   Past Medical History:   Past Medical History  Diagnosis Date  . Vulvar cancer 2007  . Hypertension   . High cholesterol   . Type II diabetes mellitus   . Diabetic peripheral neuropathy   . Cellulitis 06/30/2012    RLE  . Depression    History of Loss of Consciousness:  No Seizure History:  No Cardiac History:  No Allergies:  No Known Allergies Current Medications:  Current Outpatient Prescriptions  Medication Sig Dispense Refill  . aspirin EC 325 MG tablet Take 325 mg by mouth daily.    . Canagliflozin (INVOKANA) 100 MG TABS Take 1 tablet by mouth daily.    . enalapril (VASOTEC) 5 MG tablet Take 5 mg by mouth 2 (two) times daily.    . insulin aspart (NOVOLOG) 100 UNIT/ML injection Inject 15-24 Units into the skin 3 (three) times daily before meals. Sliding Scale.    . insulin glargine (LANTUS) 100 UNIT/ML injection Inject 80 Units into the skin at bedtime.     . metFORMIN (GLUCOPHAGE) 1000 MG tablet Take 1,000 mg by mouth 2 (two) times daily with a meal.    . metoprolol (LOPRESSOR) 50 MG tablet Take 1 tablet by mouth daily.    . pantoprazole (PROTONIX) 40 MG tablet Take 40 mg by mouth daily.    . rosuvastatin (CRESTOR) 40 MG tablet Take 40 mg by mouth daily.    Marland Kitchen topiramate (TOPAMAX) 50 MG tablet daily.    Marland Kitchen venlafaxine (EFFEXOR) 75 MG tablet Take 1  tablet (75 mg total) by mouth 2 (two) times daily. 60 tablet 2  . venlafaxine (EFFEXOR) 75 MG tablet Take 1 tablet (75 mg total) by mouth 2 (two) times daily. 60 tablet 3  . pregabalin (LYRICA) 25 MG capsule Take 25 mg by mouth 3 (three) times daily.     No current facility-administered medications for this visit.    Previous Psychotropic Medications:  Medication Dose   Prozac 20 mg                        Substance Abuse History in the last 12 months: Substance Age of 1st Use Last Use Amount Specific Type  Nicotine      Alcohol      Cannabis    was using marijuana until a few weeks ago    Opiates      Cocaine  Methamphetamines      LSD      Ecstasy      Benzodiazepines      Caffeine      Inhalants      Others:                          Medical Consequences of Substance Abuse: none  Legal Consequences of Substance Abuse: none  Family Consequences of Substance Abuse: none  Blackouts:  No DT's:  No Withdrawal Symptoms:  No None  Social History: Current Place of Residence: [place] of Birth: [place] Family Members: Adopted as a baby, has one adoptive sister Marital Status:  Married Children:   Sons: 1  Daughters:  Relationships:  Education:  Administrator, sports Problems/Performance:  Religious Beliefs/Practices: Unknown History of Abuse: none Pensions consultant; Therapist, nutritional History:  None. Legal History: none Hobbies/Interests: None  Family History:   Family History  Problem Relation Age of Onset  . Adopted: Yes    Mental Status Examination/Evaluation: Objective:  Appearance: Casual and Fairly Groomed  Eye Contact::  Fair  Speech:  Slow  Volume:  normal  Mood:  good   Affect: bright  Thought Process:  Goal Directed  Orientation:  Full (Time, Place, and Person)  Thought Content:  Rumination  Suicidal Thoughts:  No  Homicidal Thoughts:  No  Judgement:  Good  Insight:  Fair  Psychomotor  Activity:  Normal t  Akathisia:  No  Handed:  Right  AIMS (if indicated):    Assets:  Communication Skills Desire for Improvement Resilience Social Support    Laboratory/X-Ray Psychological Evaluation(s)   Sent from [facility] and reviewed     Assessment:  Axis I: Major Depression, single episode  AXIS I Major Depression, single episode  AXIS II Deferred  AXIS III Past Medical History  Diagnosis Date  . Vulvar cancer 2007  . Hypertension   . High cholesterol   . Type II diabetes mellitus   . Diabetic peripheral neuropathy   . Cellulitis 06/30/2012    RLE  . Depression      AXIS IV problems with primary support group  AXIS V 41-50 serious symptoms   Treatment Plan/Recommendations:  Plan of Care: Medication management   Laboratory:   Psychotherapy: She has been assigned a therapist here   Medications: She will continue Effexor 75 mg twice a day   Routine PRN Medications:  No  Consultations:   Safety Concerns:  She denies thoughts of hurting self or others   Other:  She'll return in 4 months but call she needs to come in sooner     Levonne Spiller, MD 3/1/20169:09 AM

## 2015-02-07 ENCOUNTER — Ambulatory Visit (INDEPENDENT_AMBULATORY_CARE_PROVIDER_SITE_OTHER): Payer: 59 | Admitting: Psychiatry

## 2015-02-07 ENCOUNTER — Encounter (HOSPITAL_COMMUNITY): Payer: Self-pay | Admitting: Psychiatry

## 2015-02-07 VITALS — BP 112/65 | HR 95 | Ht 67.0 in | Wt 218.4 lb

## 2015-02-07 DIAGNOSIS — F329 Major depressive disorder, single episode, unspecified: Secondary | ICD-10-CM

## 2015-02-07 DIAGNOSIS — F322 Major depressive disorder, single episode, severe without psychotic features: Secondary | ICD-10-CM

## 2015-02-07 MED ORDER — VENLAFAXINE HCL 75 MG PO TABS: 75.0000 mg | ORAL_TABLET | Freq: Two times a day (BID) | ORAL | Status: DC

## 2015-02-07 NOTE — Progress Notes (Signed)
Patient ID: Meghan [name], female   DOB: 1956/01/26, 59 y.o.   MRN: [id number] Patient ID: Meghan [name], female   DOB: 04/21/1956, 59 y.o.   MRN: [id number] Patient ID: Meghan [name], female   DOB: 24-Aug-1955, 59 y.o.   MRN: [id number] Patient ID: Meghan [name], female   DOB: 1956-05-25, 59 y.o.   MRN: [id number] Patient ID: Meghan [name], female   DOB: 03-05-1956, 59 y.o.   MRN: [id number]  Psychiatric Assessment Adult  Patient Identification:  Meghan [name] Date of Evaluation:  02/07/2015 Chief Complaint: "I'm doing much better" History of Chief Complaint:   Chief Complaint  Patient presents with  . Depression  . Anxiety  . Follow-up    Anxiety     this patient is a 59 year old married white female who lives with her husband in [place]. She has one son and 2 stepsons. She is currently applying for disability.  The patient was referred by [facility]. She was hospitalized there from August 24 to 04/05/2014 after taking a drug overdose of Xanax and opana.  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 She's been married to her current husband for 20 years. He's currently having an affair with a younger woman and she found out about this last May. He actually left from June 26 throughJuly 29 and he apparently was staying with this other woman. The patient also had a very good friend died in 19-Apr-2023.  On the day of the overdose the patient claimed she was not suicidal that she had sent out texts to her friends indicating that she was depressed. She took an overdose of her husband Xanax and opana in order to "get away from the stress" while in [place] they increased her Prozac from 20-60 mg  Daily but it has not helped and she's not able to tolerate a higher dose because it makes her too  drowsy. She still crying quite a bit and feels sad. She has some difficulty sleeping. She denies anxiety or panic. Her energy is low. She feels that her husband is still not engaged in the marriage and she has caught him talking to the other woman several times on the phone. She is thinking about separating from him.  The patient had vulvar cancer in 2009 and since then has not felt like being sexually active. She thinks this may have contributed to the breakdown in the marriage. She does not use drugs or alcohol but was smoking marijuana prior to this recent admission but claims she is stopped.   The patient returns after 6 months. She continues to do well on Effexor. Her mood is good and her husband is no longer drinking. She is drowsy during the day and I suggested she take her topamax at night Review of Systems  Constitutional: Positive for activity change.  HENT: Negative.   Eyes: Negative.   Respiratory: Negative.   Cardiovascular: Negative.   Gastrointestinal: Negative.   Endocrine: Negative.   Genitourinary: Negative.   Musculoskeletal: Positive for gait problem.  Skin: Negative.   Allergic/Immunologic: Negative.   Neurological: Positive for numbness.  Hematological: Negative.   Psychiatric/Behavioral: Positive for sleep disturbance and dysphoric mood.   Physical Exam not done Depressive Symptoms: depressed mood, anhedonia, insomnia, psychomotor retardation, fatigue, feelings of worthlessness/guilt, hopelessness, suicidal attempt, decreased labido,  (Hypo) Manic  Symptoms:   Elevated Mood:  No Irritable Mood:  Yes Grandiosity:  No Distractibility:  No Labiality of Mood:  No Delusions:  No Hallucinations:  No Impulsivity:  No Sexually Inappropriate Behavior:  No Financial Extravagance:  No Flight of Ideas:  No  Anxiety Symptoms: Excessive Worry:  Yes Panic Symptoms:  No Agoraphobia:  No Obsessive Compulsive: No  Symptoms: None, Specific Phobias:  No Social  Anxiety:  No  Psychotic Symptoms:  Hallucinations: No None Delusions:  No Paranoia:  No   Ideas of Reference:  No  PTSD Symptoms: Ever had a traumatic exposure:  No Had a traumatic exposure in the last month:  No Re-experiencing: No None Hypervigilance:  No Hyperarousal: No None Avoidance: No None  Traumatic Brain Injury: Yes recent fall  Past Psychiatric History: Diagnosis: Maj. depression   Hospitalizations: Last month at [place]: None   Substance Abuse Care: None   Self-Mutilation: None   Suicidal Attempts: Once last month   Violent Behaviors: none   Past Medical History:   Past Medical History  Diagnosis Date  . Vulvar cancer 2007  . Hypertension   . High cholesterol   . Type II diabetes mellitus   . Diabetic peripheral neuropathy   . Cellulitis 06/30/2012    RLE  . Depression    History of Loss of Consciousness:  No Seizure History:  No Cardiac History:  No Allergies:  No Known Allergies Current Medications:  Current Outpatient Prescriptions  Medication Sig Dispense Refill  . aspirin EC 325 MG tablet Take 325 mg by mouth daily.    . enalapril (VASOTEC) 5 MG tablet Take 5 mg by mouth 2 (two) times daily.    . insulin aspart (NOVOLOG) 100 UNIT/ML injection Inject 15-24 Units into the skin 3 (three) times daily before meals. Sliding Scale.    . insulin glargine (LANTUS) 100 UNIT/ML injection Inject 80 Units into the skin at bedtime.     . metFORMIN (GLUCOPHAGE) 1000 MG tablet Take 1,000 mg by mouth 2 (two) times daily with a meal.    . metoprolol (LOPRESSOR) 50 MG tablet Take 1 tablet by mouth daily.    . pantoprazole (PROTONIX) 40 MG tablet Take 40 mg by mouth daily.    . pregabalin (LYRICA) 25 MG capsule Take 25 mg by mouth 3 (three) times daily.    . rosuvastatin (CRESTOR) 40 MG tablet Take 40 mg by mouth daily.    Marland Kitchen topiramate (TOPAMAX) 50 MG tablet daily.    Marland Kitchen venlafaxine (EFFEXOR) 75 MG tablet Take 1 tablet (75 mg total) by mouth  2 (two) times daily. 60 tablet 3   No current facility-administered medications for this visit.    Previous Psychotropic Medications:  Medication Dose   Prozac 20 mg                        Substance Abuse History in the last 12 months: Substance Age of 1st Use Last Use Amount Specific Type  Nicotine      Alcohol      Cannabis    was using marijuana until a few weeks ago    Opiates      Cocaine      Methamphetamines      LSD      Ecstasy      Benzodiazepines      Caffeine      Inhalants      Others:  Medical Consequences of Substance Abuse: none  Legal Consequences of Substance Abuse: none  Family Consequences of Substance Abuse: none  Blackouts:  No DT's:  No Withdrawal Symptoms:  No None  Social History: Current Place of Residence: [place] of Birth: [place] Family Members: Adopted as a baby, has one adoptive sister Marital Status:  Married Children:   Sons: 1  Daughters:  Relationships:  Education:  Administrator, sports Problems/Performance:  Religious Beliefs/Practices: Unknown History of Abuse: none Pensions consultant; Therapist, nutritional History:  None. Legal History: none Hobbies/Interests: None  Family History:   Family History  Problem Relation Age of Onset  . Adopted: Yes    Mental Status Examination/Evaluation: Objective:  Appearance: Casual and Fairly Groomed  Eye Contact::  Fair  Speech:  Slow  Volume:  normal  Mood:  good   Affect: bright  Thought Process:  Goal Directed  Orientation:  Full (Time, Place, and Person)  Thought Content:  Rumination  Suicidal Thoughts:  No  Homicidal Thoughts:  No  Judgement:  Good  Insight:  Fair  Psychomotor Activity:  Normal   Akathisia:  No  Handed:  Right  AIMS (if indicated):    Assets:  Communication Skills Desire for Improvement Resilience Social Support    Laboratory/X-Ray Psychological Evaluation(s)   Sent  from [facility] and reviewed     Assessment:  Axis I: Major Depression, single episode  AXIS I Major Depression, single episode  AXIS II Deferred  AXIS III Past Medical History  Diagnosis Date  . Vulvar cancer 2007  . Hypertension   . High cholesterol   . Type II diabetes mellitus   . Diabetic peripheral neuropathy   . Cellulitis 06/30/2012    RLE  . Depression      AXIS IV problems with primary support group  AXIS V 41-50 serious symptoms   Treatment Plan/Recommendations:  Plan of Care: Medication management   Laboratory:   Psychotherapy: She has been assigned a therapist here   Medications: She will continue Effexor 75 mg twice a day   Routine PRN Medications:  No  Consultations:   Safety Concerns:  She denies thoughts of hurting self or others   Other:  She'll return in 4 months but call she needs to come in sooner     Levonne Spiller, MD 7/1/20169:39 AM

## 2015-06-10 ENCOUNTER — Encounter (HOSPITAL_COMMUNITY): Payer: Self-pay | Admitting: Psychiatry

## 2015-06-10 ENCOUNTER — Ambulatory Visit (INDEPENDENT_AMBULATORY_CARE_PROVIDER_SITE_OTHER): Payer: No Typology Code available for payment source | Admitting: Psychiatry

## 2015-06-10 VITALS — BP 135/75 | HR 85 | Ht 67.0 in | Wt 221.0 lb

## 2015-06-10 DIAGNOSIS — F322 Major depressive disorder, single episode, severe without psychotic features: Secondary | ICD-10-CM

## 2015-06-10 MED ORDER — VENLAFAXINE HCL 75 MG PO TABS: 75.0000 mg | ORAL_TABLET | Freq: Two times a day (BID) | ORAL | Status: DC

## 2015-06-10 NOTE — Progress Notes (Signed)
Patient ID: SAVI LASTINGER, female   DOB: [birth year], 60 y.o.   MRN: [id number] Patient ID: CEAZIA HARB, female   DOB: [birth year], 59 y.o.   MRN: [id number] Patient ID: JEANELL MANGAN, female   DOB: [birth year], 59 y.o.   MRN: [id number] Patient ID: EDELMIRA GALLOGLY, female   DOB: [birth year], 59 y.o.   MRN: [id number] Patient ID: MAVA SUARES, female   DOB: Aug 07, 1956, 59 y.o.   MRN: [id number] Patient ID: METHA KOLASA, female   DOB: 1956-06-11, 59 y.o.   MRN: [id number]  Psychiatric Assessment Adult  Patient Identification:  Meghan [name] Date of Evaluation:  06/10/2015 Chief Complaint: "I'm doing much better" History of Chief Complaint:   Chief Complaint  Patient presents with  . Depression  . Anxiety  . Follow-up    Depression        Past medical history includes anxiety.   Anxiety     this patient is a 59 year old married white female who lives with her husband in [place]. She has one son and 2 stepsons. She is currently applying for disability.  The patient was referred by [facility]. She was hospitalized there from August 24 to 04/05/2014 after taking a drug overdose of Xanax and opana.  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 She's been married to her current husband for 20 years. He's currently having an affair with a younger woman and she found out about this last May. He actually left from June 26 throughJuly 29 and he apparently was staying with this other woman. The patient also had a very good friend died in 03/28/23.  On the day of the overdose the patient claimed she was not suicidal that she had sent out texts to her friends indicating that she was depressed. She took an overdose of her husband Xanax and opana in order to "get away from the stress" while in [place] they increased her Prozac from 20-60 mg  Daily but it has not helped and she's not able to tolerate a higher dose because it makes her too drowsy. She still crying quite a bit and feels sad. She has some difficulty sleeping. She denies anxiety or panic. Her energy is low. She feels that her husband is still not engaged in the marriage and she has caught him talking to the other woman several times on the phone. She is thinking about separating from him.  The patient had vulvar cancer in 2009 and since then has not felt like being sexually active. She thinks this may have contributed to the breakdown in the marriage. She does not use drugs or alcohol but was smoking marijuana prior to this recent admission but claims she is stopped.   The patient returns after 4 months. She states that her husband has had a lot of health problems and had a blood clot in his leg recently. He may lose his leg and she's very worried about it. He is no longer drinking but continues to smoke. She is trying to do things for herself like be with her friends. She gets a lot of comfort from prayer. She thinks the Effexor is helping overall but she still has a lot of anxiety about her husband's health. I suggested counseling here but she declined today Review of Systems  Constitutional: Positive for activity  change.  HENT: Negative.   Eyes: Negative.   Respiratory: Negative.   Cardiovascular: Negative.   Gastrointestinal: Negative.   Endocrine: Negative.   Genitourinary: Negative.   Musculoskeletal: Positive for gait problem.  Skin: Negative.   Allergic/Immunologic: Negative.   Neurological: Positive for numbness.  Hematological: Negative.   Psychiatric/Behavioral: Positive for depression, sleep disturbance and dysphoric mood.   Physical Exam not done Depressive Symptoms: depressed mood, anhedonia, insomnia, psychomotor retardation, fatigue, feelings of worthlessness/guilt, hopelessness, suicidal  attempt, decreased labido,  (Hypo) Manic Symptoms:   Elevated Mood:  No Irritable Mood:  Yes Grandiosity:  No Distractibility:  No Labiality of Mood:  No Delusions:  No Hallucinations:  No Impulsivity:  No Sexually Inappropriate Behavior:  No Financial Extravagance:  No Flight of Ideas:  No  Anxiety Symptoms: Excessive Worry:  Yes Panic Symptoms:  No Agoraphobia:  No Obsessive Compulsive: No  Symptoms: None, Specific Phobias:  No Social Anxiety:  No  Psychotic Symptoms:  Hallucinations: No None Delusions:  No Paranoia:  No   Ideas of Reference:  No  PTSD Symptoms: Ever had a traumatic exposure:  No Had a traumatic exposure in the last month:  No Re-experiencing: No None Hypervigilance:  No Hyperarousal: No None Avoidance: No None  Traumatic Brain Injury: Yes recent fall  Past Psychiatric History: Diagnosis: Maj. depression   Hospitalizations: Last month at [place]: None   Substance Abuse Care: None   Self-Mutilation: None   Suicidal Attempts: Once last month   Violent Behaviors: none   Past Medical History:   Past Medical History  Diagnosis Date  . Vulvar cancer ([place]) 2007  . Hypertension   . High cholesterol   . Type II diabetes mellitus ([place])   . Diabetic peripheral neuropathy ([place])   . Cellulitis 06/30/2012    RLE  . Depression    History of Loss of Consciousness:  No Seizure History:  No Cardiac History:  No Allergies:  No Known Allergies Current Medications:  Current Outpatient Prescriptions  Medication Sig Dispense Refill  . aspirin EC 325 MG tablet Take 325 mg by mouth daily.    . enalapril (VASOTEC) 5 MG tablet Take 5 mg by mouth 2 (two) times daily.    . insulin aspart (NOVOLOG) 100 UNIT/ML injection Inject 15-24 Units into the skin 3 (three) times daily before meals. Sliding Scale.    . insulin glargine (LANTUS) 100 UNIT/ML injection Inject 80 Units into the skin at bedtime.     . metFORMIN (GLUCOPHAGE) 1000 MG  tablet Take 1,000 mg by mouth 2 (two) times daily with a meal.    . metoprolol (LOPRESSOR) 50 MG tablet Take 1 tablet by mouth daily.    . pantoprazole (PROTONIX) 40 MG tablet Take 40 mg by mouth daily.    . pregabalin (LYRICA) 25 MG capsule Take 25 mg by mouth 3 (three) times daily.    . rosuvastatin (CRESTOR) 40 MG tablet Take 40 mg by mouth daily.    Marland Kitchen topiramate (TOPAMAX) 50 MG tablet daily.    Marland Kitchen venlafaxine (EFFEXOR) 75 MG tablet Take 1 tablet (75 mg total) by mouth 2 (two) times daily. 60 tablet 3   No current facility-administered medications for this visit.    Previous Psychotropic Medications:  Medication Dose   Prozac 20 mg                        Substance Abuse History in the last 12 months: Substance Age  of 1st Use Last Use Amount Specific Type  Nicotine      Alcohol      Cannabis    was using marijuana until a few weeks ago    Opiates      Cocaine      Methamphetamines      LSD      Ecstasy      Benzodiazepines      Caffeine      Inhalants      Others:                          Medical Consequences of Substance Abuse: none  Legal Consequences of Substance Abuse: none  Family Consequences of Substance Abuse: none  Blackouts:  No DT's:  No Withdrawal Symptoms:  No None  Social History: Current Place of Residence: [place] of Birth: [place] Family Members: Adopted as a baby, has one adoptive sister Marital Status:  Married Children:   Sons: 1  Daughters:  Relationships:  Education:  Administrator, sports Problems/Performance:  Religious Beliefs/Practices: Unknown History of Abuse: none Pensions consultant; Therapist, nutritional History:  None. Legal History: none Hobbies/Interests: None  Family History:   Family History  Problem Relation Age of Onset  . Adopted: Yes    Mental Status Examination/Evaluation: Objective:  Appearance: Casual and Fairly Groomed  Eye Contact::  Fair  Speech:  Slow   Volume:  normal  Mood:  good became tearful when talking about her husband's health problems   Affect: bright  Thought Process:  Goal Directed  Orientation:  Full (Time, Place, and Person)  Thought Content:  Rumination  Suicidal Thoughts:  No  Homicidal Thoughts:  No  Judgement:  Good  Insight:  Fair  Psychomotor Activity:  Normal   Akathisia:  No  Handed:  Right  AIMS (if indicated):    Assets:  Communication Skills Desire for Improvement Resilience Social Support    Laboratory/X-Ray Psychological Evaluation(s)   Sent from [facility] and reviewed     Assessment:  Axis I: Major Depression, single episode  AXIS I Major Depression, single episode  AXIS II Deferred  AXIS III Past Medical History  Diagnosis Date  . Vulvar cancer ([place]) 2007  . Hypertension   . High cholesterol   . Type II diabetes mellitus ([place])   . Diabetic peripheral neuropathy ([place])   . Cellulitis 06/30/2012    RLE  . Depression      AXIS IV problems with primary support group  AXIS V 41-50 serious symptoms   Treatment Plan/Recommendations:  Plan of Care: Medication management   Laboratory:   Psychotherapy: She has been assigned a therapist here   Medications: She will continue Effexor 75 mg twice a day   Routine PRN Medications:  No  Consultations:   Safety Concerns:  She denies thoughts of hurting self or others   Other:  She'll return in 3 months but call she needs to come in sooner     Levonne Spiller, MD 11/1/20169:37 AM

## 2015-09-10 ENCOUNTER — Ambulatory Visit (INDEPENDENT_AMBULATORY_CARE_PROVIDER_SITE_OTHER): Payer: No Typology Code available for payment source | Admitting: Psychiatry

## 2015-09-10 ENCOUNTER — Encounter (HOSPITAL_COMMUNITY): Payer: Self-pay | Admitting: Psychiatry

## 2015-09-10 VITALS — BP 106/72 | HR 74 | Ht 67.0 in | Wt 216.6 lb

## 2015-09-10 DIAGNOSIS — F322 Major depressive disorder, single episode, severe without psychotic features: Secondary | ICD-10-CM | POA: Diagnosis not present

## 2015-09-10 MED ORDER — VENLAFAXINE HCL ER 150 MG PO CP24: 300.0000 mg | ORAL_CAPSULE | Freq: Every day | ORAL | Status: DC

## 2015-09-10 NOTE — Progress Notes (Signed)
Patient ID: Meghan [name], female   DOB: 1956/07/03, 60 y.o.   MRN: [id number] Patient ID: Meghan [name], female   DOB: 10-10-1955, 60 y.o.   MRN: [id number] Patient ID: Meghan [name], female   DOB: 1955/12/05, 60 y.o.   MRN: [id number] Patient ID: Meghan [name], female   DOB: 1956/03/02, 60 y.o.   MRN: [id number] Patient ID: Meghan [name], female   DOB: 1956/03/30, 60 y.o.   MRN: [id number] Patient ID: Meghan [name], female   DOB: 02/07/1956, 60 y.o.   MRN: [id number] Patient ID: Meghan [name], female   DOB: 1955-11-13, 60 y.o.   MRN: [id number]  Psychiatric Assessment Adult  Patient Identification:  Meghan [name] Date of Evaluation:  09/10/2015 Chief Complaint: "I'm doing much better" History of Chief Complaint:   Chief Complaint  Patient presents with  . Depression  . Anxiety  . Follow-up    Depression        Past medical history includes anxiety.   Anxiety     this patient is a 60 year old married white female who lives with her husband in [place]. She has one son and 2 stepsons. She is currently applying for disability.  The patient was referred by [facility]. She was hospitalized there from August 24 to 04/05/2014 after taking a drug overdose of Xanax and opana.  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 She's been married to her current husband for 20 years. He's currently having an affair with a younger woman and she found out about this last May. He actually left from June 26 throughJuly 29 and he apparently was staying with this other woman. The patient also had a very good friend died in 04-09-23.  On the day of the overdose the patient claimed she was not suicidal that she had sent out texts to her friends indicating that she was depressed. She took an overdose  of her husband Xanax and opana in order to "get away from the stress" while in [place] they increased her Prozac from 20-60 mg  Daily but it has not helped and she's not able to tolerate a higher dose because it makes her too drowsy. She still crying quite a bit and feels sad. She has some difficulty sleeping. She denies anxiety or panic. Her energy is low. She feels that her husband is still not engaged in the marriage and she has caught him talking to the other woman several times on the phone. She is thinking about separating from him.  The patient had vulvar cancer in 2009 and since then has not felt like being sexually active. She thinks this may have contributed to the breakdown in the marriage. She does not use drugs or alcohol but was smoking marijuana prior to this recent admission but claims she is stopped.   The patient returns after 3 months. She is not doing well lately. Her husband developed a blood clot in his leg that got infected and he had his leg amputated. She is now doing everything to take care of him by herself. She feels frustrated and overwhelmed. She's been crying a lot. She denies suicidal ideation but does feel quite depressed and stressed. I suggested we go up on her Effexor from 150-300 mg. I also strongly suggested she seek counseling here and she finally agreed Review  of Systems  Constitutional: Positive for activity change.  HENT: Negative.   Eyes: Negative.   Respiratory: Negative.   Cardiovascular: Negative.   Gastrointestinal: Negative.   Endocrine: Negative.   Genitourinary: Negative.   Musculoskeletal: Positive for gait problem.  Skin: Negative.   Allergic/Immunologic: Negative.   Neurological: Positive for numbness.  Hematological: Negative.   Psychiatric/Behavioral: Positive for depression, sleep disturbance and dysphoric mood.   Physical Exam not done Depressive Symptoms: depressed mood, anhedonia, insomnia, psychomotor  retardation, fatigue, feelings of worthlessness/guilt, hopelessness, suicidal attempt, decreased labido,  (Hypo) Manic Symptoms:   Elevated Mood:  No Irritable Mood:  Yes Grandiosity:  No Distractibility:  No Labiality of Mood:  No Delusions:  No Hallucinations:  No Impulsivity:  No Sexually Inappropriate Behavior:  No Financial Extravagance:  No Flight of Ideas:  No  Anxiety Symptoms: Excessive Worry:  Yes Panic Symptoms:  No Agoraphobia:  No Obsessive Compulsive: No  Symptoms: None, Specific Phobias:  No Social Anxiety:  No  Psychotic Symptoms:  Hallucinations: No None Delusions:  No Paranoia:  No   Ideas of Reference:  No  PTSD Symptoms: Ever had a traumatic exposure:  No Had a traumatic exposure in the last month:  No Re-experiencing: No None Hypervigilance:  No Hyperarousal: No None Avoidance: No None  Traumatic Brain Injury: Yes recent fall  Past Psychiatric History: Diagnosis: Maj. depression   Hospitalizations: Last month at [facility]: None   Substance Abuse Care: None   Self-Mutilation: None   Suicidal Attempts: Once last month   Violent Behaviors: none   Past Medical History:   Past Medical History  Diagnosis Date  . Vulvar cancer ([place]) 2007  . Hypertension   . High cholesterol   . Type II diabetes mellitus ([place])   . Diabetic peripheral neuropathy ([place])   . Cellulitis 06/30/2012    RLE  . Depression    History of Loss of Consciousness:  No Seizure History:  No Cardiac History:  No Allergies:  No Known Allergies Current Medications:  Current Outpatient Prescriptions  Medication Sig Dispense Refill  . aspirin EC 325 MG tablet Take 325 mg by mouth daily.    . enalapril (VASOTEC) 5 MG tablet Take 5 mg by mouth 2 (two) times daily.    . insulin aspart (NOVOLOG) 100 UNIT/ML injection Inject 15-24 Units into the skin 3 (three) times daily before meals. Sliding Scale.    . insulin glargine (LANTUS) 100 UNIT/ML  injection Inject 80 Units into the skin at bedtime.     . metFORMIN (GLUCOPHAGE) 1000 MG tablet Take 1,000 mg by mouth 2 (two) times daily with a meal.    . metoprolol (LOPRESSOR) 50 MG tablet Take 1 tablet by mouth daily.    . pantoprazole (PROTONIX) 40 MG tablet Take 40 mg by mouth daily.    . pregabalin (LYRICA) 25 MG capsule Take 25 mg by mouth 3 (three) times daily.    . rosuvastatin (CRESTOR) 40 MG tablet Take 40 mg by mouth daily.    Marland Kitchen topiramate (TOPAMAX) 50 MG tablet daily.    Marland Kitchen venlafaxine XR (EFFEXOR XR) 150 MG 24 hr capsule Take 2 capsules (300 mg total) by mouth daily with breakfast. 60 capsule 2   No current facility-administered medications for this visit.    Previous Psychotropic Medications:  Medication Dose   Prozac 20 mg  Substance Abuse History in the last 12 months: Substance Age of 1st Use Last Use Amount Specific Type  Nicotine      Alcohol      Cannabis    was using marijuana until a few weeks ago    Opiates      Cocaine      Methamphetamines      LSD      Ecstasy      Benzodiazepines      Caffeine      Inhalants      Others:                          Medical Consequences of Substance Abuse: none  Legal Consequences of Substance Abuse: none  Family Consequences of Substance Abuse: none  Blackouts:  No DT's:  No Withdrawal Symptoms:  No None  Social History: Current Place of Residence: [place] of Birth: [place] Family Members: Adopted as a baby, has one adoptive sister Marital Status:  Married Children:   Sons: 1  Daughters:  Relationships:  Education:  Administrator, sports Problems/Performance:  Religious Beliefs/Practices: Unknown History of Abuse: none Pensions consultant; Therapist, nutritional History:  None. Legal History: none Hobbies/Interests: None  Family History:   Family History  Problem Relation Age of Onset  . Adopted: Yes    Mental Status  Examination/Evaluation: Objective:  Appearance: Casual and Fairly Groomed  Eye Contact::  Fair  Speech:  Slow  Volume:  normal  Mood: depressed  Affect: Dysphoric and tearful   Thought Process:  Goal Directed  Orientation:  Full (Time, Place, and Person)  Thought Content:  Rumination  Suicidal Thoughts:  No  Homicidal Thoughts:  No  Judgement:  Good  Insight:  Fair  Psychomotor Activity:  Normal   Akathisia:  No  Handed:  Right  AIMS (if indicated):    Assets:  Communication Skills Desire for Improvement Resilience Social Support    Laboratory/X-Ray Psychological Evaluation(s)   Sent from [facility] and reviewed     Assessment:  Axis I: Major Depression, single episode  AXIS I Major Depression, single episode  AXIS II Deferred  AXIS III Past Medical History  Diagnosis Date  . Vulvar cancer ([place]) 2007  . Hypertension   . High cholesterol   . Type II diabetes mellitus ([place])   . Diabetic peripheral neuropathy ([place])   . Cellulitis 06/30/2012    RLE  . Depression      AXIS IV problems with primary support group  AXIS V 41-50 serious symptoms   Treatment Plan/Recommendations:  Plan of Care: Medication management   Laboratory:   Psychotherapy: She agrees to see Maurice Small here   Medications: She will change Effexor to the XL form and increase the dosage to 300 mg daily   Routine PRN Medications:  No  Consultations:   Safety Concerns:  She denies thoughts of hurting self or others   Other:  She'll return in 6 weeks     Levonne Spiller, MD 2/1/20179:36 AM

## 2015-09-23 ENCOUNTER — Encounter (HOSPITAL_COMMUNITY): Payer: Self-pay | Admitting: Psychiatry

## 2015-09-23 ENCOUNTER — Ambulatory Visit (INDEPENDENT_AMBULATORY_CARE_PROVIDER_SITE_OTHER): Payer: No Typology Code available for payment source | Admitting: Psychiatry

## 2015-09-23 DIAGNOSIS — F322 Major depressive disorder, single episode, severe without psychotic features: Secondary | ICD-10-CM

## 2015-09-23 NOTE — Patient Instructions (Signed)
Discussed orally 

## 2015-09-23 NOTE — Progress Notes (Signed)
Comprehensive Clinical Assessment (CCA) Note  09/23/2015 Meghan [name] [id number]  Visit Diagnosis:      ICD-9-CM ICD-10-CM   1. Major depressive disorder, single episode, severe without psychotic features ([place]) 296.23 F32.2       CCA Part One  Part One has been completed on paper by the patient.  (See scanned document in Chart Review)  CCA Part Two A  Intake/Chief Complaint:  CCA Intake With Chief Complaint CCA Part Two Date: 09/23/15 CCA Part Two Time: 1323 Chief Complaint/Presenting Problem: My husband had leg amputated in December 2016. When I go somewhere, he times me when I am gone. I dont't like that. I am not happy with my life now. I am miserable in my life. My husband left me for a younger woman 2 years ago for about 3 months and drained my checking account. I took overdose of medication when that happened and had to be hospitalized. I still thave thoughts about how he mistreated me. Every thing is on me and I am the only one who takes care of him. I am  unappreciated.  Patients Currently Reported Symptoms/Problems: crying all the time, depressed mood, don't want to eat, don't care if I get out of bed, staiy in bed most of the time unless I have to go somewhere, no interest in doing anything. feels overwhelmed Individual's Strengths: caring, love my friends, loyal Individual's Preferences: want to have a good life, want to be happy ,not stressed, and not crying every day Type of Services Patient Feels Are Needed: Individual therapy  Initial Clinical Notes/Concerns: Patient presents with symptoms of depression that initially when husband left 2 years ago for a younger woman for about 3 months.. She took a pill overdose and was hospitalized at [facility]. Current stressors include marriage and unresolved feelings regarding husband's affair along with her caretaker  responsiblities for husband  who had  his leg amputated in December 2016.   Mental Health Symptoms Depression:   Depression: Hopelessness, Increase/decrease in appetite, Tearfulness, Change in energy/activity, Difficulty Concentrating, Sleep (too much or little)  Mania:  Mania: N/A  Anxiety:   Anxiety: Difficulty concentrating, Worrying, Tension, Sleep  Psychosis:  Psychosis: N/A  Trauma:  N/A  Obsessions:  Obsessions: N/A  Compulsions:  Compulsions: N/A  Inattention:  Inattention: N/A  Hyperactivity/Impulsivity:  Hyperactivity/Impulsivity: N/A  Oppositional/Defiant Behaviors:  Oppositional/Defiant Behaviors: N/A  Borderline Personality:  Emotional Irregularity: N/A  Other Mood/Personality Symptoms:     Mental Status Exam Appearance and self-care  Stature:  Stature: Tall  Weight:  Weight: Overweight  Clothing:  Clothing: Casual  Grooming:  Grooming: Normal  Cosmetic use:  Cosmetic Use: None  Posture/gait:  Posture/Gait: Normal  Motor activity:  Motor Activity: Not Remarkable  Sensorium  Attention:  Attention: Normal  Concentration:  Concentration: Anxiety interferes  Orientation:  Orientation: Object, Person, Place, Situation, Time  Recall/memory:  Recall/Memory: Normal  Affect and Mood  Affect:  Affect: Depressed  Mood:  Mood: Depressed  Relating  Eye contact:  Eye Contact: Normal  Facial expression:  Facial Expression: Responsive  Attitude toward examiner:  Attitude Toward Examiner: Cooperative  Thought and Language  Speech flow: Speech Flow: Normal  Thought content:  Thought Content: Appropriate to mood and circumstances  Preoccupation:  Preoccupations: Ruminations  Hallucinations:  Hallucinations: Other (Comment) (None)  Organization:    Transport planner of Knowledge:  Fund of Knowledge: Average  Intelligence:  Intelligence: Average  Abstraction:  Abstraction: Normal  Judgement:  Judgement: Normal  Reality Testing:  Reality Testing: Realistic  Insight:  Insight: Good  Decision Making:  Decision Making: Normal  Social Functioning  Social Maturity:  Social Maturity:  Responsible  Social Judgement:  Social Judgement: Normal  Stress  Stressors:  Stressors: Family conflict  Coping Ability:  Coping Ability: English as a second language teacher Deficits:    Supports:  Son, friends   Family and Psychosocial History: Family history Marital status: Married (Patient has been married twice. First marriage ended after 10 years due to husband's drug addiction and patient didn't want son raised in that environment. Patient and her husband reside in [place]. ) Number of Years Married: 21 What types of issues is patient dealing with in the relationship?: Husband is demanding and controllilng and there remains unresolved issues regarding past infidelity. Are you sexually active?: No (Patietn has cancer of the vulvar and husband made  negative comments about patient's body.  She reports no interest in sex with husband since that time. ) What is your sexual orientation?: heterosexual  Has your sexual activity been affected by drugs, alcohol, medication, or emotional stress?: Emotional stress Does patient have children?: Yes How many children?: 1 (Patient has a 45 year old son. He resides in [place].) How is patient's relationship with their children?: Patient reports getting along great with son, talking to him at least 2-3 x per week, sees him 2-3 x per year.  Childhood History:  Childhood History By whom was/is the patient raised?: Adoptive parents (Patient was adopted at birth. She reports no contact with biological father. She met her biological mother when patient was 59 -years -old.) Additional childhood history information: Patient was born and raised in [place], [place]. Description of patient's relationship with caregiver when they were a child: Good relationship. She was the apple of dad's eye. Patient's description of current relationship with people who raised him/her: deceased How were you disciplined when you got in trouble as a child/adolescent?: whippings,  Does patient  have siblings?: Yes (Patient has 1 adoptive sister and 3 half brother.) Number of Siblings: 1 Description of patient's current relationship with siblings: Good relationship with adoptive sister, talk to each other on the phone every night. Patient reports no contact with half brothers.  Did patient suffer any verbal/emotional/physical/sexual abuse as a child?: No Did patient suffer from severe childhood neglect?: No Has patient ever been sexually abused/assaulted/raped as an adolescent or adult?: No Was the patient ever a victim of a crime or a disaster?: No Witnessed domestic violence?: No Has patient been effected by domestic violence as an adult?: No  CCA Part Two B  Employment/Work Situation: Employment / Work Situation Employment situation: On disability Why is patient on disability: half of right foot amputated.due to Type 2 diabetes,  How long has patient been on disability: almost a year - May 2016 Patient's job has been impacted by current illness: No What is the longest time patient has a held a job?: 10 years Where was the patient employed at that time?: [employer] as a Occupational psychologist. 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  Exercise/Diet: Exercise/Diet Do You Exercise?: Yes What Type of Exercise Do You Do?: Other (Comment) (sit ups) How Many Times a Week Do You Exercise?: Daily Have You Gained or Lost A Significant Amount of Weight in the Past Six Months?: No Do You Follow a Special Diet?: No Do You Have Any Trouble Sleeping?: Yes Explanation of Sleeping Difficulties: difficulty falling and staying asleep  CCA Part Two C  Alcohol/Drug Use:N/A CA Part Three  ASAM's:  Six Dimensions of Multidimensional Assessment N/A  Substance use Disorder (SUD) N/A   Social Function:  Social Functioning Social Maturity: Responsible Social Judgement: Normal  Stress:  Stress Stressors: Family conflict Coping Ability: Overwhelmed Patient Takes Medications The Way The Doctor Instructed?: Yes Priority Risk: Moderate Risk  Risk Assessment- Self-Harm Potential: Risk Assessment For Self-Harm Potential Thoughts of Self-Harm: No current thoughts Additional Information for Self-Harm Potential: Previous Attempts (Patient reports one suicide attempt two years ago by pill overdose.)  Risk Assessment -Dangerous to Others Potential: No current thoughts Risk Assessment For Dangerous to Others Potential Method: No Plan  DSM5 Diagnoses: Patient Active Problem List   Diagnosis Date Noted  . History of gout 06/30/2012  . DM (diabetes mellitus), type 2, uncontrolled, periph vascular complic ([place]) 33/74/4514  . HTN (hypertension) 06/30/2012  . Cellulitis of leg 06/30/2012  . ARF (acute renal failure) ([place]) 06/30/2012    Patient Centered Plan: Patient is on the following Treatment Plan(s):  Depression  Recommendations for Services/Supports/Treatments: Recommendations for Services/Supports/Treatments Recommendations For Services/Supports/Treatments: Individual Therapy  Treatment Plan  Summary: The patient attends the assessment appointment today. Confidentiality limits were discussed. The patient agrees to return for an appointment in 2 weeks for continuing assessment and treatment planning. Patient will continue to see psychiatrist Dr. Harrington Challenger for medication management. Patient agrees to call this practice, call 911, or have someone take her to the emergency room should symptoms worsen. Individual therapy is recommended 1 time every 1-2 weeks to alleviate symptoms of depression and resume previous level of functioning.  Referrals to Alternative Service(s): Referred to Alternative Service(s):   Place:   Date:   Time:    Referred to Alternative Service(s):   Place:   Date:   Time:    Referred to Alternative Service(s):   Place:   Date:   Time:    Referred to Alternative Service(s):   Place:   Date:   Time:     Lem Peary

## 2015-10-14 ENCOUNTER — Encounter (HOSPITAL_COMMUNITY): Payer: Self-pay | Admitting: Psychiatry

## 2015-10-14 ENCOUNTER — Ambulatory Visit (INDEPENDENT_AMBULATORY_CARE_PROVIDER_SITE_OTHER): Payer: No Typology Code available for payment source | Admitting: Psychiatry

## 2015-10-14 ENCOUNTER — Telehealth (HOSPITAL_COMMUNITY): Payer: Self-pay | Admitting: *Deleted

## 2015-10-14 DIAGNOSIS — F322 Major depressive disorder, single episode, severe without psychotic features: Secondary | ICD-10-CM

## 2015-10-14 NOTE — Progress Notes (Signed)
   THERAPIST PROGRESS NOTE  Session Time:   Tuesday 10/14/2015 1:58 PM - 3:05 PM  Participation Level: Active  Behavioral Response: CasualAlertAnxious and Depressed  Type of Therapy: Individual Therapy  Treatment Goals addressed: Establish therapeutic alliance, learn and implement behavioral strategies to overcome depression  Interventions: CBT and Supportive  Summary: Meghan [name] is a 60 y.o. female who presents with symptoms of depression that initially when husband left 2 years ago for a younger woman for about 3 months.. She took a pill overdose and was hospitalized at [facility]. Current stressors include marriage and unresolved feelings regarding husband's affair along with her caretaker responsiblities for husband who had his leg amputated in December 2016. She also reports becoming with another man about a year ago and says he started becoming more distant this past October. Patient states crying all the time, having  depressed mood, not wanting to eat, losing interest, and staying in  bed most of the time unless she has to go somewhere, and feeling overwhelmed  Patient reports continued depressed mood, loss of interest in activities, daily crying spells, decreased appetite, and ruminating thoughts since last session. She did recently attend a cook out at her friend's house and says she enjoyed this. However, she expresses frustration her husband expected her to be home by 10:00. She reports he will be going to a rehabilitation facility today to learn how to use prosthesis. She is tearful in session today as she talks about her involvement with another man and expresses sadness and confusion about the change in the relationship.  Suicidal/Homicidal: No  Therapist Response: Therapist works with patient to establish therapeutic alliance, review symptoms, identify stressors, process feelings, provide psychoeducation regarding depression and causes, discuss rationale for and identify  ways to improve self-care, introduce and give instructions regarding using daily planning to increase daily routine/structure and increase involvement in activities, discuss rationale for and practice controlled breathing techniques.  Plan: Return again in 2 weeks.  Diagnosis: Axis I: MDD    Axis II: Deferred    Dyke Weible, LCSW 10/14/2015

## 2015-10-14 NOTE — Telephone Encounter (Signed)
Prior authorization for venlafaxine ER received. Called [phone] spoke with Olin Hauser who states someone will call with decision.

## 2015-10-14 NOTE — Patient Instructions (Signed)
Discussed orally 

## 2015-10-15 ENCOUNTER — Telehealth (HOSPITAL_COMMUNITY): Payer: Self-pay | Admitting: *Deleted

## 2015-10-15 NOTE — Telephone Encounter (Signed)
voice message from [employer] regarding quanity.  please call for confirmation.

## 2015-10-15 NOTE — Telephone Encounter (Signed)
Pt is aware and shows understand

## 2015-10-15 NOTE — Telephone Encounter (Signed)
Pt is aware that office sent prior auth to her insurance and decision will be made when insurance call office. Pt showed understanding.

## 2015-10-17 ENCOUNTER — Telehealth (HOSPITAL_COMMUNITY): Payer: Self-pay | Admitting: *Deleted

## 2015-10-17 NOTE — Telephone Encounter (Signed)
Incoming call from [facility] with Approval for pt Effexor XR. Dates of approval are 10-14-15 to 10-13-16. They stated they did not have did not have a confirmation number but a letter will be mailed to office with approval. Office agreed. [phone]

## 2015-10-22 ENCOUNTER — Encounter (HOSPITAL_COMMUNITY): Payer: Self-pay | Admitting: Psychiatry

## 2015-10-22 ENCOUNTER — Ambulatory Visit (INDEPENDENT_AMBULATORY_CARE_PROVIDER_SITE_OTHER): Payer: No Typology Code available for payment source | Admitting: Psychiatry

## 2015-10-22 VITALS — BP 112/70 | HR 80 | Ht 67.0 in | Wt 211.2 lb

## 2015-10-22 DIAGNOSIS — F322 Major depressive disorder, single episode, severe without psychotic features: Secondary | ICD-10-CM

## 2015-10-22 MED ORDER — VENLAFAXINE HCL ER 150 MG PO CP24: 300.0000 mg | ORAL_CAPSULE | Freq: Every day | ORAL | Status: DC

## 2015-10-22 MED ORDER — BREXPIPRAZOLE 2 MG PO TABS: 2.0000 mg | ORAL_TABLET | Freq: Every day | ORAL | Status: DC
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
## 2015-10-30 ENCOUNTER — Telehealth (HOSPITAL_COMMUNITY): Payer: Self-pay | Admitting: *Deleted

## 2015-10-30 NOTE — Telephone Encounter (Signed)
Faxed approval of Venlafaxine ER received. Good until 10/13/16

## 2015-11-10 ENCOUNTER — Ambulatory Visit (HOSPITAL_COMMUNITY): Payer: Self-pay | Admitting: Psychiatry

## 2015-11-19 ENCOUNTER — Telehealth (HOSPITAL_COMMUNITY): Payer: Self-pay | Admitting: *Deleted

## 2015-11-19 NOTE — Telephone Encounter (Signed)
lmtcb

## 2015-11-19 NOTE — Telephone Encounter (Signed)
SAMPLES OF Rexulti, cost $360.  She need the coupon to be faxed to [facility]

## 2015-11-20 NOTE — Telephone Encounter (Signed)
Pt called asking if she could get coupon for Rexulti.

## 2015-11-20 NOTE — Telephone Encounter (Signed)
ok 

## 2015-11-20 NOTE — Telephone Encounter (Signed)
Pt is aware and shows understanding 

## 2015-11-24 ENCOUNTER — Ambulatory Visit (INDEPENDENT_AMBULATORY_CARE_PROVIDER_SITE_OTHER): Payer: No Typology Code available for payment source | Admitting: Psychiatry

## 2015-11-24 ENCOUNTER — Encounter (HOSPITAL_COMMUNITY): Payer: Self-pay | Admitting: Psychiatry

## 2015-11-24 DIAGNOSIS — F322 Major depressive disorder, single episode, severe without psychotic features: Secondary | ICD-10-CM

## 2015-11-24 NOTE — Patient Instructions (Signed)
Discussed orally 
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
## 2015-12-03 ENCOUNTER — Ambulatory Visit (INDEPENDENT_AMBULATORY_CARE_PROVIDER_SITE_OTHER): Payer: No Typology Code available for payment source | Admitting: Psychiatry

## 2015-12-03 ENCOUNTER — Encounter (HOSPITAL_COMMUNITY): Payer: Self-pay | Admitting: Psychiatry

## 2015-12-03 VITALS — BP 101/60 | HR 75 | Ht 67.0 in | Wt 220.0 lb

## 2015-12-03 DIAGNOSIS — F322 Major depressive disorder, single episode, severe without psychotic features: Secondary | ICD-10-CM | POA: Diagnosis not present

## 2015-12-03 MED ORDER — BREXPIPRAZOLE 2 MG PO TABS
2.0000 mg | ORAL_TABLET | Freq: Every day | ORAL | Status: DC
Start: 2015-12-03 — End: 2016-01-28

## 2015-12-03 MED ORDER — VENLAFAXINE HCL ER 150 MG PO CP24: 300.0000 mg | ORAL_CAPSULE | Freq: Every day | ORAL | Status: DC
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
## 2015-12-12 ENCOUNTER — Ambulatory Visit (INDEPENDENT_AMBULATORY_CARE_PROVIDER_SITE_OTHER): Payer: No Typology Code available for payment source | Admitting: Psychiatry

## 2015-12-12 DIAGNOSIS — F322 Major depressive disorder, single episode, severe without psychotic features: Secondary | ICD-10-CM
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
## 2015-12-12 NOTE — Patient Instructions (Signed)
Discussed orally 

## 2015-12-24 ENCOUNTER — Telehealth (HOSPITAL_COMMUNITY): Payer: Self-pay | Admitting: *Deleted

## 2015-12-29 ENCOUNTER — Ambulatory Visit (HOSPITAL_COMMUNITY): Payer: Self-pay | Admitting: Psychiatry

## 2016-01-13 ENCOUNTER — Ambulatory Visit (HOSPITAL_COMMUNITY): Payer: Self-pay | Admitting: Psychiatry

## 2016-01-14 ENCOUNTER — Encounter (HOSPITAL_COMMUNITY): Payer: Self-pay | Admitting: Psychiatry

## 2016-01-14 ENCOUNTER — Ambulatory Visit (INDEPENDENT_AMBULATORY_CARE_PROVIDER_SITE_OTHER): Payer: No Typology Code available for payment source | Admitting: Psychiatry

## 2016-01-14 DIAGNOSIS — F322 Major depressive disorder, single episode, severe without psychotic features: Secondary | ICD-10-CM

## 2016-01-14 NOTE — Progress Notes (Signed)
      THERAPIST PROGRESS NOTE  Session Time:   Wednesday 01/14/2016 8:51 AM - 9:42 AM   Participation Level: Active  Behavioral Response: CasualAlert/depressed and tearful  Type of Therapy: Individual Therapy  Treatment Goals addressed: Learn and implement conflict resolution skills to resolve interpersonal problems  Interventions: CBT and Supportive  Summary: Meghan [name] is a 60 y.o. female who presents with symptoms of depression that initially when husband left 2 years ago for a younger woman for about 3 months.. She took a pill overdose and was hospitalized at [facility]. Current stressors include marriage and unresolved feelings regarding husband's affair along with her caretaker responsiblities for husband who had his leg amputated in December 2016. She also reports becoming with another man about a year ago and says he started becoming more distant this past October. Patient states crying all the time, having  depressed mood, not wanting to eat, losing interest, and staying in  bed most of the time unless she has to go somewhere, and feeling overwhelmed  Patient was seen about 3 weeks ago. She reports increased periods of depression and crying spells since last session. She says she sometimes does not want to get out of bed. She denies any suicidal ideations. She reports increased thoughts about the man she was involved with about a year ago and reports missing that relationship. She continues to express frustration that this man remains distant. She and her husband remain separated but he wants to reconcile. Patient now expresses ambivalent feelings regarding this and expresses guilt as husband is disabled.    Suicidal/Homicidal: No  Therapist Response: Reviewed symptoms, facilitated expression of feelings, began to identify qualities of a healthy relationship, discussed boundary issues in patient's relationships, began to explore healthy ways to satisfy unmet emotional  needs,assisted patient identify ways to increase behavioral activation  Plan: Return again in 2 weeks. Patient agrees to use handout to schedule and implement pleasant activities, set life goals for the next 3 months Diagnosis: Axis I: MDD    Axis II: Deferred    Ahmoni Edge, LCSW  01/14/2016

## 2016-01-14 NOTE — Patient Instructions (Signed)
Discussed orally 

## 2016-01-28 ENCOUNTER — Ambulatory Visit (INDEPENDENT_AMBULATORY_CARE_PROVIDER_SITE_OTHER): Payer: No Typology Code available for payment source | Admitting: Psychiatry

## 2016-01-28 ENCOUNTER — Encounter (HOSPITAL_COMMUNITY): Payer: Self-pay | Admitting: Psychiatry

## 2016-01-28 VITALS — BP 117/60 | HR 71 | Ht 67.0 in | Wt 219.0 lb

## 2016-01-28 DIAGNOSIS — F322 Major depressive disorder, single episode, severe without psychotic features: Secondary | ICD-10-CM

## 2016-01-28 MED ORDER — BREXPIPRAZOLE 2 MG PO TABS: 2.0000 mg | ORAL_TABLET | Freq: Every day | ORAL | Status: DC

## 2016-01-28 MED ORDER — VENLAFAXINE HCL ER 150 MG PO CP24: 300.0000 mg | ORAL_CAPSULE | Freq: Every day | ORAL | Status: DC
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
## 2016-02-02 ENCOUNTER — Ambulatory Visit (HOSPITAL_COMMUNITY): Payer: Self-pay | Admitting: Psychiatry

## 2016-02-04 ENCOUNTER — Ambulatory Visit (HOSPITAL_COMMUNITY): Payer: Self-pay | Admitting: Psychiatry

## 2016-04-28 ENCOUNTER — Ambulatory Visit (INDEPENDENT_AMBULATORY_CARE_PROVIDER_SITE_OTHER): Payer: No Typology Code available for payment source | Admitting: Psychiatry

## 2016-04-28 ENCOUNTER — Encounter (HOSPITAL_COMMUNITY): Payer: Self-pay | Admitting: Psychiatry

## 2016-04-28 VITALS — BP 104/69 | HR 80 | Ht 67.0 in | Wt 211.6 lb

## 2016-04-28 DIAGNOSIS — F322 Major depressive disorder, single episode, severe without psychotic features: Secondary | ICD-10-CM

## 2016-04-28 MED ORDER — VENLAFAXINE HCL ER 150 MG PO CP24: 300.0000 mg | ORAL_CAPSULE | Freq: Every day | ORAL | 2 refills | Status: DC

## 2016-04-28 NOTE — Progress Notes (Signed)
Patient ID: Meghan [name], female   DOB: 01/23/1956, 60 y.o.   MRN: [id number] Patient ID: Meghan [name], female   DOB: 11-06-1955, 60 y.o.   MRN: [id number] Patient ID: Meghan [name], female   DOB: [birth year], 60 y.o.   MRN: [id number] Patient ID: Meghan [name], female   DOB: Dec 09, 1955, 60 y.o.   MRN: [id number] Patient ID: Meghan [name], female   DOB: November 09, 1955, 60 y.o.   MRN: [id number] Patient ID: Meghan [name], female   DOB: Mar 08, 1956, 60 y.o.   MRN: [id number] Patient ID: Meghan [name], female   DOB: [birth year], 60 y.o.   MRN: [id number] Patient ID: Meghan [name], female   DOB: 10-Jun-1956, 60 y.o.   MRN: [id number] Patient ID: Meghan [name], female   DOB: [birth year], 60 y.o.   MRN: [id number] Patient ID: Meghan [name], female   DOB: 1955-09-09, 60 y.o.   MRN: [id number]  Psychiatric Assessment Adult  Patient Identification:  Meghan [name] Date of Evaluation:  04/28/2016 Chief Complaint: "I'm doing ok" History of Chief Complaint:   Chief Complaint  Patient presents with  . Depression  . Anxiety  . Follow-up    Depression         Past medical history includes anxiety.   Anxiety      this patient is a 59 year old married white female who lives with her husband in [place]. She has one son and 2 stepsons. She is currently applying for disability.  The patient was referred by [facility]. She was hospitalized there from August 24 to 04/05/2014 after taking a drug overdose of Xanax and opana.  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 She's been married to her current husband for 20 years. He's currently having an affair with a younger woman and she found out about this last May. He actually left from June 26 throughJuly 29 and he apparently  was staying with this other woman. The patient also had a very good friend died in 2023-04-06.  On the day of the overdose the patient claimed she was not suicidal that she had sent out texts to her friends indicating that she was depressed. She took an overdose of her husband Xanax and opana in order to "get away from the stress" while in [place] they increased her Prozac from 20-60 mg  Daily but it has not helped and she's not able to tolerate a higher dose because it makes her too drowsy. She still crying quite a bit and feels sad. She has some difficulty sleeping. She denies anxiety or panic. Her energy is low. She feels that her husband is still not engaged in the marriage and she has caught him talking to the other woman several times on the phone. She is thinking about separating from him.  The patient had vulvar cancer in 2009 and since then has not felt like being sexually active. She thinks this may have contributed to the breakdown in the marriage. She does not use drugs or alcohol but was smoking marijuana prior to this recent admission but claims she is stopped.   The patient returns after 3 months. She and her husband are living apart but they still spend time together. She states she is happier doing it this way. She stopped the Rex ulti because she couldn't afford it  but feels like she is doing the same with or without it. Her mood is good and she is sleeping well just with the Effexor XR. Her energy is good and she is getting out with friends and enjoying her life. Review of Systems  Constitutional: Positive for activity change.  HENT: Negative.   Eyes: Negative.   Respiratory: Negative.   Cardiovascular: Negative.   Gastrointestinal: Negative.   Endocrine: Negative.   Genitourinary: Negative.   Musculoskeletal: Positive for gait problem.  Skin: Negative.   Allergic/Immunologic: Negative.   Neurological: Positive for numbness.  Hematological: Negative.    Psychiatric/Behavioral: Positive for depression, dysphoric mood and sleep disturbance.   Physical Exam not done Depressive Symptoms: depressed mood, anhedonia, insomnia, psychomotor retardation, fatigue, feelings of worthlessness/guilt, hopelessness, suicidal attempt, decreased labido,  (Hypo) Manic Symptoms:   Elevated Mood:  No Irritable Mood:  Yes Grandiosity:  No Distractibility:  No Labiality of Mood:  No Delusions:  No Hallucinations:  No Impulsivity:  No Sexually Inappropriate Behavior:  No Financial Extravagance:  No Flight of Ideas:  No  Anxiety Symptoms: Excessive Worry:  Yes Panic Symptoms:  No Agoraphobia:  No Obsessive Compulsive: No  Symptoms: None, Specific Phobias:  No Social Anxiety:  No  Psychotic Symptoms:  Hallucinations: No None Delusions:  No Paranoia:  No   Ideas of Reference:  No  PTSD Symptoms: Ever had a traumatic exposure:  No Had a traumatic exposure in the last month:  No Re-experiencing: No None Hypervigilance:  No Hyperarousal: No None Avoidance: No None  Traumatic Brain Injury: Yes recent fall  Past Psychiatric History: Diagnosis: Maj. depression   Hospitalizations: Last month at [place]: None   Substance Abuse Care: None   Self-Mutilation: None   Suicidal Attempts: Once last month   Violent Behaviors: none   Past Medical History:   Past Medical History:  Diagnosis Date  . Cellulitis 06/30/2012   RLE  . Depression   . Diabetic peripheral neuropathy ([place])   . High cholesterol   . Hypertension   . Type II diabetes mellitus ([place])   . Vulvar cancer ([place]) 2007   History of Loss of Consciousness:  No Seizure History:  No Cardiac History:  No Allergies:  No Known Allergies Current Medications:  Current Outpatient Prescriptions  Medication Sig Dispense Refill  . aspirin EC 325 MG tablet Take 325 mg by mouth daily.    . enalapril (VASOTEC) 5 MG tablet Take 5 mg by mouth 2 (two) times  daily.    . insulin aspart (NOVOLOG) 100 UNIT/ML injection Inject 15-24 Units into the skin 3 (three) times daily before meals. Sliding Scale.    . insulin glargine (LANTUS) 100 UNIT/ML injection Inject 80 Units into the skin at bedtime.     . metFORMIN (GLUCOPHAGE) 1000 MG tablet Take 1,000 mg by mouth 2 (two) times daily with a meal.    . metoprolol (LOPRESSOR) 50 MG tablet Take 1 tablet by mouth daily.    . pantoprazole (PROTONIX) 40 MG tablet Take 40 mg by mouth daily.    . rosuvastatin (CRESTOR) 40 MG tablet Take 40 mg by mouth daily.    Marland Kitchen topiramate (TOPAMAX) 50 MG tablet Take 50 mg by mouth daily.     Marland Kitchen venlafaxine XR (EFFEXOR XR) 150 MG 24 hr capsule Take 2 capsules (300 mg total) by mouth daily with breakfast. 60 capsule 2  . pregabalin (LYRICA) 25 MG capsule Take 25 mg by mouth 3 (three) times daily.  No current facility-administered medications for this visit.     Previous Psychotropic Medications:  Medication Dose   Prozac 20 mg                        Substance Abuse History in the last 12 months: Substance Age of 1st Use Last Use Amount Specific Type  Nicotine      Alcohol      Cannabis    was using marijuana until a few weeks ago    Opiates      Cocaine      Methamphetamines      LSD      Ecstasy      Benzodiazepines      Caffeine      Inhalants      Others:                          Medical Consequences of Substance Abuse: none  Legal Consequences of Substance Abuse: none  Family Consequences of Substance Abuse: none  Blackouts:  No DT's:  No Withdrawal Symptoms:  No None  Social History: Current Place of Residence: [place] of Birth: [place] Family Members: Adopted as a baby, has one adoptive sister Marital Status:  Married Children:   Sons: 1  Daughters:  Relationships:  Education:  Administrator, sports Problems/Performance:  Religious Beliefs/Practices: Unknown History of Abuse: none Development worker, community; Therapist, nutritional History:  None. Legal History: none Hobbies/Interests: None  Family History:   Family History  Problem Relation Age of Onset  . Adopted: Yes    Mental Status Examination/Evaluation: Objective:  Appearance: Casual and Fairly Groomed,   Eye Contact::  Fair  Speech:  Slow  Volume:  normal  Mood:  good   Affect: bright  Thought Process:  Goal Directed  Orientation:  Full (Time, Place, and Person)  Thought Content:  Rumination  Suicidal Thoughts:  No  Homicidal Thoughts:  No  Judgement:  Good  Insight:  Fair  Psychomotor Activity:  Normal   Akathisia:  No  Handed:  Right  AIMS (if indicated):    Assets:  Communication Skills Desire for Improvement Resilience Social Support    Laboratory/X-Ray Psychological Evaluation(s)   Sent from [facility] and reviewed     Assessment:  Axis I: Major Depression, single episode  AXIS I Major Depression, single episode  AXIS II Deferred  AXIS III Past Medical History:  Diagnosis Date  . Cellulitis 06/30/2012   RLE  . Depression   . Diabetic peripheral neuropathy ([place])   . High cholesterol   . Hypertension   . Type II diabetes mellitus ([place])   . Vulvar cancer ([place]) 2007     AXIS IV problems with primary support group  AXIS V 41-50 serious symptoms   Treatment Plan/Recommendations:  Plan of Care: Medication management   Laboratory:   Psychotherapy: She agrees to see Maurice Small here   Medications: She will continue Effexor XR 300 mg daily   Routine PRN Medications:  No  Consultations:   Safety Concerns:  She denies thoughts of hurting self or others   Other:  She'll return in 3 months     Levonne Spiller, MD 9/20/20179:31 AM

## 2016-07-27 ENCOUNTER — Encounter (HOSPITAL_COMMUNITY): Payer: Self-pay | Admitting: Psychiatry

## 2016-07-27 ENCOUNTER — Ambulatory Visit (INDEPENDENT_AMBULATORY_CARE_PROVIDER_SITE_OTHER): Payer: No Typology Code available for payment source | Admitting: Psychiatry

## 2016-07-27 VITALS — BP 112/83 | HR 85 | Ht 67.0 in | Wt 226.4 lb

## 2016-07-27 DIAGNOSIS — Z794 Long term (current) use of insulin: Secondary | ICD-10-CM

## 2016-07-27 DIAGNOSIS — Z7982 Long term (current) use of aspirin: Secondary | ICD-10-CM | POA: Diagnosis not present

## 2016-07-27 DIAGNOSIS — F322 Major depressive disorder, single episode, severe without psychotic features: Secondary | ICD-10-CM

## 2016-07-27 DIAGNOSIS — Z79899 Other long term (current) drug therapy: Secondary | ICD-10-CM

## 2016-07-27 MED ORDER — FLUOXETINE HCL 20 MG PO CAPS: 20.0000 mg | ORAL_CAPSULE | Freq: Every day | ORAL | 2 refills | Status: DC

## 2016-07-27 NOTE — Progress Notes (Signed)
Patient ID: Meghan [name], female   DOB: 1955-10-14, 60 y.o.   MRN: [id number] Patient ID: Meghan [name], female   DOB: 09/03/1955, 60 y.o.   MRN: [id number] Patient ID: Meghan [name], female   DOB: 10/28/1955, 60 y.o.   MRN: [id number] Patient ID: Meghan [name], female   DOB: [birth year], 60 y.o.   MRN: [id number] Patient ID: Meghan [name], female   DOB: 1956/05/13, 61 y.o.   MRN: [id number] Patient ID: Meghan [name], female   DOB: [birth year], 60 y.o.   MRN: [id number] Patient ID: Meghan [name], female   DOB: [birth year], 60 y.o.   MRN: [id number] Patient ID: Meghan [name], female   DOB: 04-18-1956, 60 y.o.   MRN: [id number] Patient ID: Meghan [name], female   DOB: Jan 22, 1956, 60 y.o.   MRN: [id number] Patient ID: Meghan [name], female   DOB: 1955-11-14, 60 y.o.   MRN: [id number]  Psychiatric Assessment Adult  Patient Identification:  Meghan [name] Date of Evaluation:  07/27/2016 Chief Complaint: "I'm doing ok" History of Chief Complaint:   Chief Complaint  Patient presents with  . Follow-up    per pt she is not in the mood to do anything  . Depression    Depression         Past medical history includes anxiety.   Anxiety      this patient is a 60 year old separated white female who lives alone in [place]. She has one son and 2 stepsons. She is currently applying for disability.  The patient was referred by [facility]. She was hospitalized there from August 24 to 04/05/2014 after taking a drug overdose of Xanax and opana.  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 She's been married to her current husband for 20 years. He's currently having an affair with a younger woman and she found out about this last May. He actually left from June 26  throughJuly 29 and he apparently was staying with this other woman. The patient also had a very good friend died in 04-25-2023.  On the day of the overdose the patient claimed she was not suicidal that she had sent out texts to her friends indicating that she was depressed. She took an overdose of her husband Xanax and opana in order to "get away from the stress" while in [place] they increased her Prozac from 20-60 mg  Daily but it has not helped and she's not able to tolerate a higher dose because it makes her too drowsy. She still crying quite a bit and feels sad. She has some difficulty sleeping. She denies anxiety or panic. Her energy is low. She feels that her husband is still not engaged in the marriage and she has caught him talking to the other woman several times on the phone. She is thinking about separating from him.  The patient had vulvar cancer in 2009 and since then has not felt like being sexually active. She thinks this may have contributed to the breakdown in the marriage. She does not use drugs or alcohol but was smoking marijuana prior to this recent admission but claims she is stopped.   The patient returns after 3 months. She and her husband are living apart but they still spend time together. She stopped Effexor about a month ago. She stated it  wasn't helping her mood. But now she feels tired all the time she has no energy and often sleeps to 1 or 2 in the afternoon. She has no motivation to do anything. She thinks people of hurt her feelings like her stepson who did not invite her or her husband to his son's birthday or a man she was seeing who worked in a grocery store who now refuses to speak to her. She denies suicidal ideation but is tearful and depressed. She thinks she may have done better on Prozac so we'll go ahead and restart this but I urged her to plan some activities for every day so she can get up and go Review of Systems  Constitutional: Positive for activity change.   HENT: Negative.   Eyes: Negative.   Respiratory: Negative.   Cardiovascular: Negative.   Gastrointestinal: Negative.   Endocrine: Negative.   Genitourinary: Negative.   Musculoskeletal: Positive for gait problem.  Skin: Negative.   Allergic/Immunologic: Negative.   Neurological: Positive for numbness.  Hematological: Negative.   Psychiatric/Behavioral: Positive for depression, dysphoric mood and sleep disturbance.   Physical Exam not done Depressive Symptoms: depressed mood, anhedonia, insomnia, psychomotor retardation, fatigue, feelings of worthlessness/guilt, hopelessness, suicidal attempt, decreased labido,  (Hypo) Manic Symptoms:   Elevated Mood:  No Irritable Mood:  Yes Grandiosity:  No Distractibility:  No Labiality of Mood:  No Delusions:  No Hallucinations:  No Impulsivity:  No Sexually Inappropriate Behavior:  No Financial Extravagance:  No Flight of Ideas:  No  Anxiety Symptoms: Excessive Worry:  Yes Panic Symptoms:  No Agoraphobia:  No Obsessive Compulsive: No  Symptoms: None, Specific Phobias:  No Social Anxiety:  No  Psychotic Symptoms:  Hallucinations: No None Delusions:  No Paranoia:  No   Ideas of Reference:  No  PTSD Symptoms: Ever had a traumatic exposure:  No Had a traumatic exposure in the last month:  No Re-experiencing: No None Hypervigilance:  No Hyperarousal: No None Avoidance: No None  Traumatic Brain Injury: Yes recent fall  Past Psychiatric History: Diagnosis: Maj. depression   Hospitalizations: Last month at [place]: None   Substance Abuse Care: None   Self-Mutilation: None   Suicidal Attempts: Once last month   Violent Behaviors: none   Past Medical History:   Past Medical History:  Diagnosis Date  . Cellulitis 06/30/2012   RLE  . Depression   . Diabetic peripheral neuropathy ([place])   . High cholesterol   . Hypertension   . Type II diabetes mellitus ([place])   . Vulvar cancer ([place])  2007   History of Loss of Consciousness:  No Seizure History:  No Cardiac History:  No Allergies:  No Known Allergies Current Medications:  Current Outpatient Prescriptions  Medication Sig Dispense Refill  . aspirin EC 325 MG tablet Take 325 mg by mouth daily.    . enalapril (VASOTEC) 5 MG tablet Take 5 mg by mouth 2 (two) times daily.    . insulin aspart (NOVOLOG) 100 UNIT/ML injection Inject 15-24 Units into the skin 3 (three) times daily before meals. Sliding Scale.    . insulin glargine (LANTUS) 100 UNIT/ML injection Inject 80 Units into the skin at bedtime.     . metFORMIN (GLUCOPHAGE) 1000 MG tablet Take 1,000 mg by mouth 2 (two) times daily with a meal.    . metoprolol (LOPRESSOR) 50 MG tablet Take 1 tablet by mouth daily.    . pantoprazole (PROTONIX) 40 MG tablet Take 40 mg by mouth  daily.    . pregabalin (LYRICA) 25 MG capsule Take 25 mg by mouth 3 (three) times daily.    . rosuvastatin (CRESTOR) 40 MG tablet Take 40 mg by mouth daily.    Marland Kitchen topiramate (TOPAMAX) 50 MG tablet Take 50 mg by mouth daily.     Marland Kitchen FLUoxetine (PROZAC) 20 MG capsule Take 1 capsule (20 mg total) by mouth daily. 30 capsule 2   No current facility-administered medications for this visit.     Previous Psychotropic Medications:  Medication Dose   Prozac 20 mg                        Substance Abuse History in the last 12 months: Substance Age of 1st Use Last Use Amount Specific Type  Nicotine      Alcohol      Cannabis    was using marijuana until a few weeks ago    Opiates      Cocaine      Methamphetamines      LSD      Ecstasy      Benzodiazepines      Caffeine      Inhalants      Others:                          Medical Consequences of Substance Abuse: none  Legal Consequences of Substance Abuse: none  Family Consequences of Substance Abuse: none  Blackouts:  No DT's:  No Withdrawal Symptoms:  No None  Social History: Current Place of Residence: [place]  of Birth: [place] Family Members: Adopted as a baby, has one adoptive sister Marital Status:  Married Children:   Sons: 1  Daughters:  Relationships:  Education:  Administrator, sports Problems/Performance:  Religious Beliefs/Practices: Unknown History of Abuse: none Pensions consultant; Therapist, nutritional History:  None. Legal History: none Hobbies/Interests: None  Family History:   Family History  Problem Relation Age of Onset  . Adopted: Yes    Mental Status Examination/Evaluation: Objective:  Appearance: Casual and Fairly Groomed,   Eye Contact::  Fair  Speech:  Slow  Volume:  normal  Mood: Depressed   Affect:Upset and tearful   Thought Process:  Goal Directed  Orientation:  Full (Time, Place, and Person)  Thought Content:  Rumination  Suicidal Thoughts:  No  Homicidal Thoughts:  No  Judgement:  Good  Insight:  Fair  Psychomotor Activity:  Normal   Akathisia:  No  Handed:  Right  AIMS (if indicated):    Assets:  Communication Skills Desire for Improvement Resilience Social Support    Laboratory/X-Ray Psychological Evaluation(s)   Sent from [facility] and reviewed     Assessment:  Axis I: Major Depression, single episode  AXIS I Major Depression, single episode  AXIS II Deferred  AXIS III Past Medical History:  Diagnosis Date  . Cellulitis 06/30/2012   RLE  . Depression   . Diabetic peripheral neuropathy ([place])   . High cholesterol   . Hypertension   . Type II diabetes mellitus ([place])   . Vulvar cancer ([place]) 2007     AXIS IV problems with primary support group  AXIS V 41-50 serious symptoms   Treatment Plan/Recommendations:  Plan of Care: Medication management   Laboratory:   Psychotherapy: She agrees to see Maurice Small here   Medications: She will Start Prozac 20 mg daily   Routine PRN Medications:  No  Consultations:   Safety Concerns:  She denies thoughts of hurting self or others   Other:  She'll return in 4 weeks      Anette Barra, MD 12/19/201710:11 AM

## 2016-08-25 ENCOUNTER — Ambulatory Visit (HOSPITAL_COMMUNITY): Payer: Self-pay | Admitting: Psychiatry

## 2016-09-08 ENCOUNTER — Encounter (HOSPITAL_COMMUNITY): Payer: Self-pay | Admitting: Psychiatry

## 2016-09-08 ENCOUNTER — Ambulatory Visit (INDEPENDENT_AMBULATORY_CARE_PROVIDER_SITE_OTHER): Payer: No Typology Code available for payment source | Admitting: Psychiatry

## 2016-09-08 VITALS — BP 125/61 | HR 74 | Ht 67.0 in | Wt 226.4 lb

## 2016-09-08 DIAGNOSIS — Z79899 Other long term (current) drug therapy: Secondary | ICD-10-CM

## 2016-09-08 DIAGNOSIS — F322 Major depressive disorder, single episode, severe without psychotic features: Secondary | ICD-10-CM | POA: Diagnosis not present

## 2016-09-08 DIAGNOSIS — Z7982 Long term (current) use of aspirin: Secondary | ICD-10-CM

## 2016-09-08 MED ORDER — FLUOXETINE HCL 20 MG PO CAPS
20.0000 mg | ORAL_CAPSULE | Freq: Every day | ORAL | 2 refills | Status: DC
Start: 2016-09-08 — End: 2021-02-10

## 2016-09-08 NOTE — Progress Notes (Signed)
Patient ID: Meghan [name], female   DOB: 29-Apr-1956, 61 y.o.   MRN: [id number] Patient ID: Meghan [name], female   DOB: June 13, 1956, 61 y.o.   MRN: [id number] Patient ID: Meghan [name], female   DOB: 1955/09/02, 61 y.o.   MRN: [id number] Patient ID: Meghan [name], female   DOB: 05/29/1956, 61 y.o.   MRN: [id number] Patient ID: Meghan [name], female   DOB: 1956-07-17, 61 y.o.   MRN: [id number] Patient ID: Meghan [name], female   DOB: 12-Jul-1956, 61 y.o.   MRN: [id number] Patient ID: Meghan [name], female   DOB: Feb 03, 1956, 61 y.o.   MRN: [id number] Patient ID: Meghan [name], female   DOB: 03-02-1956, 61 y.o.   MRN: [id number] Patient ID: Meghan [name], female   DOB: November 01, 1955, 61 y.o.   MRN: [id number] Patient ID: Meghan [name], female   DOB: 1956-05-16, 61 y.o.   MRN: [id number]  Psychiatric Assessment Adult  Patient Identification:  Meghan [name] Date of Evaluation:  09/08/2016 Chief Complaint: "I'm doing ok" History of Chief Complaint:   Chief Complaint  Patient presents with  . Depression  . Anxiety  . Follow-up    Depression         Past medical history includes anxiety.   Anxiety      this patient is a 61 year old separated white female who lives alone in [place]. She has one son and 2 stepsons. She is currently applying for disability.  The patient was referred by [facility]. She was hospitalized there from August 24 to 04/05/2014 after taking a drug overdose of Xanax and opana.  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 She's been married to her current husband for 20 years. He's currently having an affair with a younger woman and she found out about this last May. He actually left from June 26 throughJuly 29 and he apparently was staying  with this other woman. The patient also had a very good friend died in 04-14-2023.  On the day of the overdose the patient claimed she was not suicidal that she had sent out texts to her friends indicating that she was depressed. She took an overdose of her husband Xanax and opana in order to "get away from the stress" while in [place] they increased her Prozac from 20-60 mg  Daily but it has not helped and she's not able to tolerate a higher dose because it makes her too drowsy. She still crying quite a bit and feels sad. She has some difficulty sleeping. She denies anxiety or panic. Her energy is low. She feels that her husband is still not engaged in the marriage and she has caught him talking to the other woman several times on the phone. She is thinking about separating from him.  The patient had vulvar cancer in 2009 and since then has not felt like being sexually active. She thinks this may have contributed to the breakdown in the marriage. She does not use drugs or alcohol but was smoking marijuana prior to this recent admission but claims she is stopped.   The patient returns after 4 weeks. Last time she stated that she had stopped the Effexor because she didn't think it was doing much for her. She had become more depressed and was getting her feelings hurt a lot. She  thought she done better on Prozac so restarted Prozac 20 mg daily. She has had a bad cold over the past month but overall she is feeling better she seems upbeat and is laughing today. She is still spending time with her husband even though they don't live together Review of Systems  Constitutional: Positive for activity change.  HENT: Negative.   Eyes: Negative.   Respiratory: Negative.   Cardiovascular: Negative.   Gastrointestinal: Negative.   Endocrine: Negative.   Genitourinary: Negative.   Musculoskeletal: Positive for gait problem.  Skin: Negative.   Allergic/Immunologic: Negative.   Neurological: Positive for  numbness.  Hematological: Negative.   Psychiatric/Behavioral: Positive for depression, dysphoric mood and sleep disturbance.   Physical Exam not done Depressive Symptoms: depressed mood, anhedonia, insomnia, psychomotor retardation, fatigue, feelings of worthlessness/guilt, hopelessness, suicidal attempt, decreased labido,  (Hypo) Manic Symptoms:   Elevated Mood:  No Irritable Mood:  Yes Grandiosity:  No Distractibility:  No Labiality of Mood:  No Delusions:  No Hallucinations:  No Impulsivity:  No Sexually Inappropriate Behavior:  No Financial Extravagance:  No Flight of Ideas:  No  Anxiety Symptoms: Excessive Worry:  Yes Panic Symptoms:  No Agoraphobia:  No Obsessive Compulsive: No  Symptoms: None, Specific Phobias:  No Social Anxiety:  No  Psychotic Symptoms:  Hallucinations: No None Delusions:  No Paranoia:  No   Ideas of Reference:  No  PTSD Symptoms: Ever had a traumatic exposure:  No Had a traumatic exposure in the last month:  No Re-experiencing: No None Hypervigilance:  No Hyperarousal: No None Avoidance: No None  Traumatic Brain Injury: Yes recent fall  Past Psychiatric History: Diagnosis: Maj. depression   Hospitalizations: Last month at [place]: None   Substance Abuse Care: None   Self-Mutilation: None   Suicidal Attempts: Once last month   Violent Behaviors: none   Past Medical History:   Past Medical History:  Diagnosis Date  . Cellulitis 06/30/2012   RLE  . Depression   . Diabetic peripheral neuropathy ([place])   . High cholesterol   . Hypertension   . Type II diabetes mellitus ([place])   . Vulvar cancer ([place]) 2007   History of Loss of Consciousness:  No Seizure History:  No Cardiac History:  No Allergies:  No Known Allergies Current Medications:  Current Outpatient Prescriptions  Medication Sig Dispense Refill  . aspirin EC 325 MG tablet Take 325 mg by mouth daily.    . enalapril (VASOTEC) 5 MG tablet  Take 5 mg by mouth 2 (two) times daily.    Marland Kitchen FLUoxetine (PROZAC) 20 MG capsule Take 1 capsule (20 mg total) by mouth daily. 30 capsule 2  . insulin aspart (NOVOLOG) 100 UNIT/ML injection Inject 15-24 Units into the skin 3 (three) times daily before meals. Sliding Scale.    . insulin glargine (LANTUS) 100 UNIT/ML injection Inject 80 Units into the skin at bedtime.     . metFORMIN (GLUCOPHAGE) 1000 MG tablet Take 1,000 mg by mouth 2 (two) times daily with a meal.    . metoprolol (LOPRESSOR) 50 MG tablet Take 1 tablet by mouth daily.    . pantoprazole (PROTONIX) 40 MG tablet Take 40 mg by mouth daily.    . pregabalin (LYRICA) 25 MG capsule Take 25 mg by mouth 3 (three) times daily.    . rosuvastatin (CRESTOR) 40 MG tablet Take 40 mg by mouth daily.    Marland Kitchen topiramate (TOPAMAX) 50 MG tablet Take 50 mg by mouth daily.  No current facility-administered medications for this visit.     Previous Psychotropic Medications:  Medication Dose   Prozac 20 mg                        Substance Abuse History in the last 12 months: Substance Age of 1st Use Last Use Amount Specific Type  Nicotine      Alcohol      Cannabis    was using marijuana until a few weeks ago    Opiates      Cocaine      Methamphetamines      LSD      Ecstasy      Benzodiazepines      Caffeine      Inhalants      Others:                          Medical Consequences of Substance Abuse: none  Legal Consequences of Substance Abuse: none  Family Consequences of Substance Abuse: none  Blackouts:  No DT's:  No Withdrawal Symptoms:  No None  Social History: Current Place of Residence: [place] of Birth: [place] Family Members: Adopted as a baby, has one adoptive sister Marital Status:  Married Children:   Sons: 1  Daughters:  Relationships:  Education:  Administrator, sports Problems/Performance:  Religious Beliefs/Practices: Unknown History of Abuse: none Development worker, community; Therapist, nutritional History:  None. Legal History: none Hobbies/Interests: None  Family History:   Family History  Problem Relation Age of Onset  . Adopted: Yes    Mental Status Examination/Evaluation: Objective:  Appearance: Casual and Fairly Groomed,   Eye Contact::  Fair  Speech:  Slow  Volume:  normal  Mood: good  Affect:bright  Thought Process:  Goal Directed  Orientation:  Full (Time, Place, and Person)  Thought Content:  Rumination  Suicidal Thoughts:  No  Homicidal Thoughts:  No  Judgement:  Good  Insight:  Fair  Psychomotor Activity:  Normal   Akathisia:  No  Handed:  Right  AIMS (if indicated):    Assets:  Communication Skills Desire for Improvement Resilience Social Support    Laboratory/X-Ray Psychological Evaluation(s)   Sent from [facility] and reviewed     Assessment:  Axis I: Major Depression, single episode  AXIS I Major Depression, single episode  AXIS II Deferred  AXIS III Past Medical History:  Diagnosis Date  . Cellulitis 06/30/2012   RLE  . Depression   . Diabetic peripheral neuropathy ([place])   . High cholesterol   . Hypertension   . Type II diabetes mellitus ([place])   . Vulvar cancer ([place]) 2007     AXIS IV problems with primary support group  AXIS V 41-50 serious symptoms   Treatment Plan/Recommendations:  Plan of Care: Medication management   Laboratory:   Psychotherapy: She agrees to see Maurice Small here   Medications: She will continue Prozac 20 mg daily   Routine PRN Medications:  No  Consultations:   Safety Concerns:  She denies thoughts of hurting self or others   Other:  She'll return in 2 months    Levonne Spiller, MD 1/31/20181:24 PM

## 2016-10-14 ENCOUNTER — Telehealth (HOSPITAL_COMMUNITY): Payer: Self-pay | Admitting: *Deleted

## 2016-10-14 NOTE — Telephone Encounter (Signed)
left voice message, provider out of office 10/25/16. 

## 2016-10-25 ENCOUNTER — Ambulatory Visit (HOSPITAL_COMMUNITY): Payer: Self-pay | Admitting: Psychiatry

## 2017-03-08 ENCOUNTER — Telehealth (INDEPENDENT_AMBULATORY_CARE_PROVIDER_SITE_OTHER): Payer: Self-pay | Admitting: Orthopedic Surgery

## 2017-03-08 NOTE — Telephone Encounter (Signed)
[facility] RECORDS FAXED TO [employer] [phone]

## 2018-08-09 DIAGNOSIS — D649 Anemia, unspecified: Secondary | ICD-10-CM

## 2018-08-09 HISTORY — DX: Anemia, unspecified: D64.9

## 2019-08-06 ENCOUNTER — Ambulatory Visit: Payer: Medicare Other | Attending: Internal Medicine

## 2019-08-06 ENCOUNTER — Other Ambulatory Visit: Payer: Self-pay

## 2019-08-06 DIAGNOSIS — Z20822 Contact with and (suspected) exposure to covid-19: Secondary | ICD-10-CM

## 2019-08-08 ENCOUNTER — Telehealth: Payer: Self-pay | Admitting: Internal Medicine

## 2019-08-08 LAB — NOVEL CORONAVIRUS, NAA: SARS-CoV-2, NAA: NOT DETECTED

## 2019-08-08 NOTE — Telephone Encounter (Signed)
Negative COVID results given. Patient results "NOT Detected." Caller expressed understanding. ° °

## 2021-02-06 ENCOUNTER — Ambulatory Visit: Payer: Self-pay

## 2021-02-06 ENCOUNTER — Other Ambulatory Visit: Payer: Self-pay | Admitting: Physician Assistant

## 2021-02-06 ENCOUNTER — Encounter: Payer: Self-pay | Admitting: Orthopedic Surgery

## 2021-02-06 ENCOUNTER — Ambulatory Visit: Payer: Medicare Other | Admitting: Orthopedic Surgery

## 2021-02-06 ENCOUNTER — Other Ambulatory Visit: Payer: Self-pay

## 2021-02-06 DIAGNOSIS — M86272 Subacute osteomyelitis, left ankle and foot: Secondary | ICD-10-CM

## 2021-02-06 DIAGNOSIS — M79672 Pain in left foot: Secondary | ICD-10-CM | POA: Diagnosis not present

## 2021-02-06 NOTE — Progress Notes (Signed)
Office Visit Note   Patient: Meghan [name]           Date of Birth: Oct 17, 1955           MRN: [id number] Visit Date: 02/06/2021              Requested by: Glenda Chroman, MD [address] [place],  [place] [address] PCP: Glenda Chroman, MD  Chief Complaint  Patient presents with   Left Foot - Pain      HPI: Patient is a 65 year old woman who presents with necrotic ulceration and exposed bone left foot third toe.  She states she was trying to trim her nails several weeks ago and has had purulent drainage with necrosis redness and swelling of the third toe.  Patient is currently on amoxicillin twice a day for 10 days.  Patient states that she has had chronic swelling in the left leg with a history of lymph node radiation.  Assessment & Plan: Visit Diagnoses:  1. Pain in left foot   2. Subacute osteomyelitis, left ankle and foot ([place])     Plan: Discussed with the patient we will need to proceed with an amputation of the infected necrotic third toe with the previous partial fifth ray amputation will plan for amputation of the third and fourth toes trying to save her metatarsal heads.  We will plan for surgery next week.  She will continue with her antibiotics dry dressing changes and a postoperative shoe.  Follow-Up Instructions: Return in about 2 weeks (around 02/20/2021).   Ortho Exam  Patient is alert, oriented, no adenopathy, well-dressed, normal affect, normal respiratory effort. Examination patient has a palpable dorsalis pedis pulse she does have increased venous swelling in the left leg secondary to her lymphatic radiation.  There is necrotic soft tissue around the third toe with swelling the ulcer probes down to bone.  There is no ascending cellulitis no purulent drainage.  Most recent hemoglobin A1c was 8.1.  Imaging: XR Foot 2 Views Left  Result Date: 02/06/2021 2 view radiographs of the left foot shows soft tissue destruction of the tip of the third toe calcification of  the arteries previous fifth ray amputation and no destructive bony changes.  No images are attached to the encounter.  Labs: Lab Results  Component Value Date   HGBA1C 8.7 (H) 07/06/2012   HGBA1C (H) 04/01/2007    8.5 (NOTE)   The ADA recommends the following therapeutic goals for glycemic   control related to Hgb A1C measurement:   Goal of Therapy:   < 7.0% Hgb A1C   Action Suggested:  > 8.0% Hgb A1C   Ref:  Diabetes Care, 22, Suppl. 1, 1999   LABURIC 6.9 06/15/2007   REPTSTATUS 07/02/2012 FINAL 06/30/2012   GRAMSTAIN  06/30/2012    RARE WBC PRESENT, PREDOMINANTLY MONONUCLEAR RARE SQUAMOUS EPITHELIAL CELLS PRESENT FEW GRAM POSITIVE COCCI IN CHAINS IN PAIRS   CULT  06/30/2012    FEW GROUP B STREP(S.AGALACTIAE)ISOLATED Note: TESTING AGAINST S. AGALACTIAE NOT ROUTINELY PERFORMED DUE TO PREDICTABILITY OF AMP/PEN/VAN SUSCEPTIBILITY.     Lab Results  Component Value Date   ALBUMIN 4.5 03/30/2014   ALBUMIN 2.7 (L) 07/01/2012   ALBUMIN 2.3 (L) 06/15/2007    No results found for: MG No results found for: VD25OH  No results found for: PREALBUMIN CBC EXTENDED Latest Ref Rng & Units 03/30/2014 07/06/2012 07/05/2012  WBC 4.0 - 10.5 K/uL 11.8(H) 11.7(H) 15.7(H)  RBC 3.87 - 5.11 MIL/uL 4.57 3.67(L) 3.21(L)  HGB 12.0 - 15.0 g/dL 12.5 9.8(L) 8.8(L)  HCT 36.0 - 46.0 % 39.9 32.0(L) 28.1(L)  PLT 150 - 400 K/uL 246 361 396  NEUTROABS 1.7 - 7.7 K/uL 7.0 - -  LYMPHSABS 0.7 - 4.0 K/uL 3.0 - -     There is no height or weight on file to calculate BMI.  Orders:  Orders Placed This Encounter  Procedures   XR Foot 2 Views Left   No orders of the defined types were placed in this encounter.    Procedures: No procedures performed  Clinical Data: No additional findings.  ROS:  All other systems negative, except as noted in the HPI. Review of Systems  Objective: Vital Signs: There were no vitals taken for this visit.  Specialty Comments:  No specialty comments available.  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    Family History  Adopted: Yes    Past Surgical History:  Procedure Laterality Date   AMPUTATION  ~ 2008   "2nd toe of right foot" (06/30/2012)   AMPUTATION  07/04/2012   Procedure: AMPUTATION RAY;  Surgeon: Newt Minion, MD;  Location: [facility];  Service: Orthopedics;  Laterality: Right;  great toe  amp vs ray amp   INCISION AND DRAINAGE OF WOUND  03/2007   "mediastinitis", left medial collarbone (06/30/2012)   LYMPHADENECTOMY  2007   "bilateral groins; had cancer vulva" (06/30/2012)   [place]  2007   "took cancer off" (06/30/2012)   Social History   Occupational History   Not on file  Tobacco Use   Smoking status: Former    Packs/day: 1.00    Years: 24.00    Pack years: 24.00    Types: Cigarettes   Smokeless tobacco: Never   Tobacco comments:    06/30/2012 "quit cigarette smoking in 1999"  Substance and Sexual Activity   Alcohol use: No    Comment: 06/30/2012 "used to drink a few beer q now and then; last beer 10-12 yr ago"   Drug use: No   Sexual activity: Not Currently

## 2021-02-11 ENCOUNTER — Encounter (HOSPITAL_COMMUNITY): Payer: Self-pay | Admitting: Orthopedic Surgery

## 2021-02-11 NOTE — Progress Notes (Signed)
DUE TO COVID-19 ONLY ONE VISITOR IS ALLOWED TO COME WITH YOU AND STAY IN THE WAITING ROOM ONLY DURING PRE OP AND PROCEDURE DAY OF SURGERY.   PCP - Dr Jerene Bears Cardiologist - n/a  Chest x-ray - n/a EKG - DOS 02/12/21 Stress Test - n/a ECHO - n/a Cardiac Cath - n/a  Sleep Study -  n/a CPAP - none  Fasting Blood Sugar - 80-130s Checks Blood Sugar 3 times a day  THE NIGHT BEFORE SURGERY, take 37 units of Lantus insulin.      THE MORNING OF SURGERY, take 37 units of Lantus insulin.  If your blood sugar is less than 70 mg/dL, you will need to treat for low blood sugar: Treat a low blood sugar (less than 70 mg/dL) with  cup of clear juice (cranberry or apple), 4 glucose tablets, OR glucose gel. Recheck blood sugar in 15 minutes after treatment (to make sure it is greater than 70 mg/dL). If your blood sugar is not greater than 70 mg/dL on recheck, call [phone] for further instructions.  Aspirin Instructions: Follow your surgeon's instructions on when to stop aspirin prior to surgery,  If no instructions were given by your surgeon then you will need to call the office for those instructions.  ERAS: Clear liquids til 9 am on DOS.  Anesthesia review: Yes  STOP now taking any Aspirin (unless otherwise instructed by your surgeon), Aleve, Naproxen, Ibuprofen, Motrin, Advil, Goody's, BC's, all herbal medications, fish oil, and all vitamins.   Coronavirus Screening Covid test n/a - Ambulatory Surgery  Do you have any of the following symptoms:  Cough yes/no: No Fever (>100.43F)  yes/no: No Runny nose yes/no: No Sore throat yes/no: No Difficulty breathing/shortness of breath  yes/no: No  Have you traveled in the last 14 days and where? yes/no: No  Patient verbalized understanding of instructions that were given via phone.

## 2021-02-12 NOTE — Anesthesia Preprocedure Evaluation (Addendum)
Anesthesia Evaluation  Patient identified by MRN, date of birth, ID band Patient awake    Reviewed: Allergy & Precautions, NPO status , Patient's Chart, lab work & pertinent test results  History of Anesthesia Complications Negative for: history of anesthetic complications  Airway Mallampati: II  TM Distance: >3 FB Neck ROM: Full    Dental  (+) Edentulous Upper, Edentulous Lower   Pulmonary neg pulmonary ROS, former smoker,    Pulmonary exam normal        Cardiovascular hypertension, Pt. on home beta blockers and Pt. on medications + Peripheral Vascular Disease  Normal cardiovascular exam     Neuro/Psych Anxiety Depression negative neurological ROS     GI/Hepatic Neg liver ROS, GERD  ,  Endo/Other  diabetes, Type 2, Insulin Dependent  Renal/GU negative Renal ROS  negative genitourinary   Musculoskeletal negative musculoskeletal ROS (+)   Abdominal   Peds  Hematology negative hematology ROS (+)   Anesthesia Other Findings   Reproductive/Obstetrics                            Anesthesia Physical Anesthesia Plan  ASA: 3  Anesthesia Plan: MAC   Post-op Pain Management:    Induction: Intravenous  PONV Risk Score and Plan: 2 and Propofol infusion, TIVA and Treatment may vary due to age or medical condition  Airway Management Planned: Natural Airway, Nasal Cannula and Simple Face Mask  Additional Equipment: None  Intra-op Plan:   Post-operative Plan:   Informed Consent: I have reviewed the patients History and Physical, chart, labs and discussed the procedure including the risks, benefits and alternatives for the proposed anesthesia with the patient or authorized representative who has indicated his/her understanding and acceptance.       Plan Discussed with:   Anesthesia Plan Comments:        Anesthesia Quick Evaluation

## 2021-02-13 ENCOUNTER — Ambulatory Visit (HOSPITAL_COMMUNITY): Payer: Medicare Other | Admitting: Physician Assistant

## 2021-02-13 ENCOUNTER — Encounter (HOSPITAL_COMMUNITY)
Admission: RE | Disposition: A | Discharge status: Home / Self Care | Payer: Self-pay | Source: Home / Self Care | Attending: Orthopedic Surgery

## 2021-02-13 ENCOUNTER — Encounter (HOSPITAL_COMMUNITY): Payer: Self-pay | Admitting: Orthopedic Surgery

## 2021-02-13 ENCOUNTER — Ambulatory Visit (HOSPITAL_COMMUNITY)
Admission: RE | Admit: 2021-02-13 | Discharge: 2021-02-13 | Disposition: A | Discharge status: Home / Self Care | Payer: Medicare Other | Attending: Orthopedic Surgery | Admitting: Orthopedic Surgery

## 2021-02-13 DIAGNOSIS — M86272 Subacute osteomyelitis, left ankle and foot: Secondary | ICD-10-CM | POA: Diagnosis not present

## 2021-02-13 DIAGNOSIS — Z87891 Personal history of nicotine dependence: Secondary | ICD-10-CM | POA: Insufficient documentation

## 2021-02-13 DIAGNOSIS — M869 Osteomyelitis, unspecified: Secondary | ICD-10-CM

## 2021-02-13 DIAGNOSIS — Z792 Long term (current) use of antibiotics: Secondary | ICD-10-CM | POA: Diagnosis not present

## 2021-02-13 DIAGNOSIS — L03116 Cellulitis of left lower limb: Secondary | ICD-10-CM

## 2021-02-13 DIAGNOSIS — Z7982 Long term (current) use of aspirin: Secondary | ICD-10-CM | POA: Insufficient documentation

## 2021-02-13 DIAGNOSIS — Z794 Long term (current) use of insulin: Secondary | ICD-10-CM | POA: Insufficient documentation

## 2021-02-13 DIAGNOSIS — Z79899 Other long term (current) drug therapy: Secondary | ICD-10-CM | POA: Insufficient documentation

## 2021-02-13 DIAGNOSIS — L97524 Non-pressure chronic ulcer of other part of left foot with necrosis of bone: Secondary | ICD-10-CM | POA: Insufficient documentation

## 2021-02-13 HISTORY — DX: Other seasonal allergic rhinitis: J30.2

## 2021-02-13 HISTORY — PX: AMPUTATION: SHX166

## 2021-02-13 HISTORY — DX: Headache, unspecified: R51.9

## 2021-02-13 HISTORY — DX: Gastro-esophageal reflux disease without esophagitis: K21.9

## 2021-02-13 HISTORY — DX: Anxiety disorder, unspecified: F41.9

## 2021-02-13 LAB — POCT I-STAT, CHEM 8
BUN: 20 mg/dL (ref 8–23)
Calcium, Ion: 1.07 mmol/L — ABNORMAL LOW (ref 1.15–1.40)
Chloride: 109 mmol/L (ref 98–111)
Creatinine, Ser: 1.6 mg/dL — ABNORMAL HIGH (ref 0.44–1.00)
Glucose, Bld: 92 mg/dL (ref 70–99)
HCT: 39 % (ref 36.0–46.0)
Hemoglobin: 13.3 g/dL (ref 12.0–15.0)
Potassium: 4.3 mmol/L (ref 3.5–5.1)
Sodium: 143 mmol/L (ref 135–145)
TCO2: 25 mmol/L (ref 22–32)

## 2021-02-13 LAB — GLUCOSE, CAPILLARY
Glucose-Capillary: 81 mg/dL (ref 70–99)
Glucose-Capillary: 89 mg/dL (ref 70–99)

## 2021-02-13 SURGERY — AMPUTATION, FOOT, RAY
Anesthesia: Monitor Anesthesia Care | Laterality: Left

## 2021-02-13 MED ORDER — LACTATED RINGERS IV SOLN: INTRAVENOUS | Status: DC

## 2021-02-13 MED ORDER — ONDANSETRON HCL 4 MG/2ML IJ SOLN
INTRAMUSCULAR | Status: DC | PRN
  Administered 2021-02-13: 4 mg via INTRAVENOUS

## 2021-02-13 MED ORDER — OXYCODONE-ACETAMINOPHEN 5-325 MG PO TABS: 1.0000 | ORAL_TABLET | ORAL | 0 refills | Status: AC | PRN

## 2021-02-13 MED ORDER — OXYCODONE HCL 5 MG PO TABS: 5.0000 mg | ORAL_TABLET | Freq: Once | ORAL | Status: DC | PRN

## 2021-02-13 MED ORDER — 0.9 % SODIUM CHLORIDE (POUR BTL) OPTIME
TOPICAL | Status: DC | PRN
  Administered 2021-02-13: 1000 mL

## 2021-02-13 MED ORDER — FENTANYL CITRATE (PF) 100 MCG/2ML IJ SOLN: 25.0000 ug | INTRAMUSCULAR | Status: DC | PRN

## 2021-02-13 MED ORDER — OXYCODONE HCL 5 MG/5ML PO SOLN: 5.0000 mg | Freq: Once | ORAL | Status: DC | PRN

## 2021-02-13 MED ORDER — MIDAZOLAM HCL 2 MG/2ML IJ SOLN
INTRAMUSCULAR | Status: DC | PRN
  Administered 2021-02-13: 2 mg via INTRAVENOUS

## 2021-02-13 MED ORDER — LIDOCAINE 2% (20 MG/ML) 5 ML SYRINGE
INTRAMUSCULAR | Status: DC | PRN
  Administered 2021-02-13: 60 mg via INTRAVENOUS

## 2021-02-13 MED ORDER — ORAL CARE MOUTH RINSE: 15.0000 mL | Freq: Once | OROMUCOSAL | Status: AC

## 2021-02-13 MED ORDER — PROPOFOL 500 MG/50ML IV EMUL
INTRAVENOUS | Status: DC | PRN
  Administered 2021-02-13: 70 ug/kg/min via INTRAVENOUS

## 2021-02-13 MED ORDER — PHENYLEPHRINE 40 MCG/ML (10ML) SYRINGE FOR IV PUSH (FOR BLOOD PRESSURE SUPPORT)
PREFILLED_SYRINGE | INTRAVENOUS | Status: DC | PRN
  Administered 2021-02-13 (×6): 80 ug via INTRAVENOUS

## 2021-02-13 MED ORDER — CEFAZOLIN SODIUM-DEXTROSE 2-4 GM/100ML-% IV SOLN
2.0000 g | INTRAVENOUS | Status: AC
  Administered 2021-02-13: 2 g via INTRAVENOUS
  Filled 2021-02-13: qty 100

## 2021-02-13 MED ORDER — EPHEDRINE SULFATE 50 MG/ML IJ SOLN
INTRAMUSCULAR | Status: DC | PRN
  Administered 2021-02-13 (×2): 10 mg via INTRAVENOUS

## 2021-02-13 MED ORDER — MIDAZOLAM HCL 2 MG/2ML IJ SOLN
INTRAMUSCULAR | Status: AC
  Filled 2021-02-13: qty 2

## 2021-02-13 MED ORDER — LIDOCAINE HCL 2 % IJ SOLN
INTRAMUSCULAR | Status: AC
  Filled 2021-02-13: qty 20

## 2021-02-13 MED ORDER — LIDOCAINE HCL (PF) 1 % IJ SOLN
INTRAMUSCULAR | Status: AC
  Filled 2021-02-13: qty 30

## 2021-02-13 MED ORDER — LIDOCAINE HCL (PF) 1 % IJ SOLN
INTRAMUSCULAR | Status: DC | PRN
  Administered 2021-02-13: 30 mL

## 2021-02-13 MED ORDER — ONDANSETRON HCL 4 MG/2ML IJ SOLN: 4.0000 mg | Freq: Once | INTRAMUSCULAR | Status: DC | PRN

## 2021-02-13 MED ORDER — FENTANYL CITRATE (PF) 250 MCG/5ML IJ SOLN
INTRAMUSCULAR | Status: AC
  Filled 2021-02-13: qty 5

## 2021-02-13 MED ORDER — AMISULPRIDE (ANTIEMETIC) 5 MG/2ML IV SOLN: 10.0000 mg | Freq: Once | INTRAVENOUS | Status: DC | PRN

## 2021-02-13 MED ORDER — FENTANYL CITRATE (PF) 250 MCG/5ML IJ SOLN
INTRAMUSCULAR | Status: DC | PRN
  Administered 2021-02-13 (×2): 25 ug via INTRAVENOUS

## 2021-02-13 MED ORDER — PROPOFOL 10 MG/ML IV BOLUS
INTRAVENOUS | Status: DC | PRN
  Administered 2021-02-13: 30 mg via INTRAVENOUS

## 2021-02-13 MED ORDER — CHLORHEXIDINE GLUCONATE 0.12 % MT SOLN
15.0000 mL | Freq: Once | OROMUCOSAL | Status: AC
  Administered 2021-02-13: 15 mL via OROMUCOSAL

## 2021-02-13 SURGICAL SUPPLY — 31 items
BAG COUNTER SPONGE SURGICOUNT (BAG) ×2 IMPLANT
BLADE SAW SGTL MED 73X18.5 STR (BLADE) IMPLANT
BLADE SURG 21 STRL SS (BLADE) ×2 IMPLANT
BNDG COHESIVE 4X5 TAN STRL (GAUZE/BANDAGES/DRESSINGS) ×2 IMPLANT
BNDG GAUZE ELAST 4 BULKY (GAUZE/BANDAGES/DRESSINGS) ×2 IMPLANT
CANISTER WOUND CARE 500ML ATS (WOUND CARE) ×2 IMPLANT
COVER SURGICAL LIGHT HANDLE (MISCELLANEOUS) ×4 IMPLANT
DRAPE DERMATAC (DRAPES) ×4 IMPLANT
DRAPE U-SHAPE 47X51 STRL (DRAPES) ×4 IMPLANT
DRESSING PEEL AND PLC PRVNA 13 (GAUZE/BANDAGES/DRESSINGS) ×1 IMPLANT
DRSG ADAPTIC 3X8 NADH LF (GAUZE/BANDAGES/DRESSINGS) ×2 IMPLANT
DRSG PAD ABDOMINAL 8X10 ST (GAUZE/BANDAGES/DRESSINGS) ×4 IMPLANT
DRSG PEEL AND PLACE PREVENA 13 (GAUZE/BANDAGES/DRESSINGS) ×2
DURAPREP 26ML APPLICATOR (WOUND CARE) ×2 IMPLANT
ELECT REM PT RETURN 9FT ADLT (ELECTROSURGICAL) ×2
ELECTRODE REM PT RTRN 9FT ADLT (ELECTROSURGICAL) ×1 IMPLANT
GAUZE SPONGE 4X4 12PLY STRL (GAUZE/BANDAGES/DRESSINGS) ×2 IMPLANT
GLOVE SURG ORTHO LTX SZ9 (GLOVE) ×2 IMPLANT
GLOVE SURG UNDER POLY LF SZ9 (GLOVE) ×2 IMPLANT
GOWN STRL REUS W/ TWL XL LVL3 (GOWN DISPOSABLE) ×2 IMPLANT
GOWN STRL REUS W/TWL XL LVL3 (GOWN DISPOSABLE) ×2
KIT BASIN OR (CUSTOM PROCEDURE TRAY) ×2 IMPLANT
KIT TURNOVER KIT B (KITS) ×2 IMPLANT
NS IRRIG 1000ML POUR BTL (IV SOLUTION) ×2 IMPLANT
PACK ORTHO EXTREMITY (CUSTOM PROCEDURE TRAY) ×2 IMPLANT
PAD ARMBOARD 7.5X6 YLW CONV (MISCELLANEOUS) ×4 IMPLANT
STOCKINETTE IMPERVIOUS LG (DRAPES) IMPLANT
SUT ETHILON 2 0 PSLX (SUTURE) ×2 IMPLANT
TOWEL GREEN STERILE (TOWEL DISPOSABLE) ×2 IMPLANT
TUBE CONNECTING 12X1/4 (SUCTIONS) ×2 IMPLANT
YANKAUER SUCT BULB TIP NO VENT (SUCTIONS) ×2 IMPLANT

## 2021-02-13 NOTE — Interval H&P Note (Signed)
History and Physical Interval Note:  02/13/2021 7:26 AM  Meghan [name]  has presented today for surgery, with the diagnosis of Osteomyelitis Left 3rd Toe.  The various methods of treatment have been discussed with the patient and family. After consideration of risks, benefits and other options for treatment, the patient has consented to  Procedure(s): LEFT FOOT 3RD AND 4TH RAY AMPUTATION (Left) as a surgical intervention.  The patient's history has been reviewed, patient examined, no change in status, stable for surgery.  I have reviewed the patient's chart and labs.  Questions were answered to the patient's satisfaction.     Newt Minion

## 2021-02-13 NOTE — H&P (Signed)
Meghan [name] is an 65 y.o. female.   Chief Complaint: Necrotic Ulcer left foot HPI: Patient is a 65 year old woman who presents with necrotic ulceration and exposed bone left foot third toe.  She states she was trying to trim her nails several weeks ago and has had purulent drainage with necrosis redness and swelling of the third toe.  Patient is currently on amoxicillin twice a day for 10 days.  Patient states that she has had chronic swelling in the left leg with a history of lymph node radiation.  Past Medical History:  Diagnosis Date   Anemia 2020   iron infusions   Anxiety    Cellulitis 06/30/2012   RLE   Depression    Diabetic peripheral neuropathy (HCC)    GERD (gastroesophageal reflux disease)    Headache    High cholesterol    Hypertension    Seasonal allergies    Type II diabetes mellitus ([place])    Vulvar cancer ([place]) 08/09/2005    Past Surgical History:  Procedure Laterality Date   AMPUTATION  ~ 2008   "2nd toe of right foot" (06/30/2012)   AMPUTATION  07/04/2012   Procedure: AMPUTATION RAY;  Surgeon: Meghan Minion, MD;  [name];  Service: Orthopedics;  Laterality: Right;  great toe  amp vs ray amp   INCISION AND DRAINAGE OF WOUND  03/10/2007   "mediastinitis", left medial collarbone (06/30/2012)   LYMPHADENECTOMY  08/09/2005   "bilateral groins; had cancer vulva" (06/30/2012)   TUBAL LIGATION     VULVA SURGERY  08/09/2005   "took cancer off" (06/30/2012)   WISDOM TOOTH EXTRACTION      Family History  Adopted: Yes   Social History:  reports that she quit smoking about 22 years ago. Her smoking use included cigarettes. She smoked an average of 1.00 packs per day. She has never used smokeless tobacco. She reports previous alcohol use. She reports that she does not use drugs.  Allergies: No Known Allergies  Medications Prior to Admission  Medication Sig Dispense Refill   amoxicillin-clavulanate (AUGMENTIN) 875-125 MG tablet Take 1 tablet by  mouth 2 (two) times daily.     aspirin EC 81 MG tablet Take 81 mg by mouth every evening. Swallow whole.     cetirizine (ZYRTEC) 10 MG tablet Take 10 mg by mouth in the morning.     FLUoxetine (PROZAC) 40 MG capsule Take 40 mg by mouth in the morning.     insulin aspart (NOVOLOG) 100 UNIT/ML injection Inject 10 Units into the skin 3 (three) times daily before meals.     insulin glargine (LANTUS) 100 UNIT/ML injection Inject 75 Units into the skin 2 (two) times daily.     metoprolol (LOPRESSOR) 50 MG tablet Take 50 mg by mouth in the morning.     Multiple Vitamin (MULTIVITAMIN WITH MINERALS) TABS tablet Take 1 tablet by mouth in the morning. One-A-Day Women's Multivitamin 50+     OZEMPIC, 0.25 OR 0.5 MG/DOSE, 2 MG/1.5ML SOPN Inject 0.5 mg into the skin every Sunday.     pantoprazole (PROTONIX) 40 MG tablet Take 40 mg by mouth in the morning.     pregabalin (LYRICA) 50 MG capsule Take 50 mg by mouth 3 (three) times daily.     rosuvastatin (CRESTOR) 40 MG tablet Take 40 mg by mouth every evening.     topiramate (TOPAMAX) 50 MG tablet Take 50 mg by mouth in the morning.      No results found for this  or any previous visit (from the past 48 hour(s)). No results found.  Review of Systems  All other systems reviewed and are negative.  Height 5\' 7"  (1.702 m), weight 90.7 kg. Physical Exam  Patient is alert, oriented, no adenopathy, well-dressed, normal affect, normal respiratory effort. Examination patient has a palpable dorsalis pedis pulse she does have increased venous swelling in the left leg secondary to her lymphatic radiation.  There is necrotic soft tissue around the third toe with swelling the ulcer probes down to bone.  There is no ascending cellulitis no purulent drainage.  Most recent hemoglobin A1c was 8.1Heart RRR Lungs Clear Assessment/Plan  1. Pain in left foot  2. Subacute osteomyelitis, left ankle and foot ([place])       Plan: Discussed with the patient we will need to proceed  with an amputation of the infected necrotic third toe with the previous partial fifth ray amputation will plan for amputation of the third and fourth toes trying to save her metatarsal heads.  We will plan for surgery next week.  She will continue with her antibiotics dry dressing changes and a postoperative shoe. Meghan Palmer Melvern Ramone, PA 02/13/2021, 6:30 AM

## 2021-02-13 NOTE — Op Note (Signed)
02/13/2021  8:25 AM  PATIENT:  Meghan [name]    PRE-OPERATIVE DIAGNOSIS:  Osteomyelitis Left 3rd Toe  POST-OPERATIVE DIAGNOSIS:  Same  PROCEDURE:  LEFT FOOT 3RD AND 4TH RAY AMPUTATION Apply 13 cm Prevena wound VAC  SURGEON:  Newt Minion, MD  PHYSICIAN ASSISTANT:None ANESTHESIA:   General  PREOPERATIVE INDICATIONS:  Meghan [name] is a  65 y.o. female with a diagnosis of Osteomyelitis Left 3rd Toe who failed conservative measures and elected for surgical management.    The risks benefits and alternatives were discussed with the patient preoperatively including but not limited to the risks of infection, bleeding, nerve injury, cardiopulmonary complications, the need for revision surgery, among others, and the patient was willing to proceed.  OPERATIVE IMPLANTS: 13 cm Prevena wound VAC  _0 @  OPERATIVE FINDINGS: Minimal petechial bleeding  OPERATIVE PROCEDURE: Patient was brought to the operating room and underwent a MAC anesthetic.  After adequate levels anesthesia obtained the left lower extremity was prepped using DuraPrep draped into a sterile field a timeout was called.  Patient underwent a regional block with 20 cc of 1% lidocaine plain.  A fishmouth incision was made around the base of the third and fourth metatarsals.  The third and fourth metatarsals were resected through the neck.  The wound was irrigated normal saline electrocardio was used for hemostasis there was minimal petechial bleeding however patient had a strong palpable dorsalis pedis pulse.  2-0 nylon was used to close the wound a Prevena wound VAC was applied this had a good suction fit patient was taken the PACU in stable condition   DISCHARGE PLANNING:  Antibiotic duration: Preoperative antibiotics  Weightbearing: Touchdown weightbearing on the left  Pain medication: Prescription for Percocet  Dressing care/ Wound VAC: Continue wound VAC for 1 week  Ambulatory devices:  Walker  Discharge to: Home.  Follow-up: In the office 1 week post operative.

## 2021-02-13 NOTE — Anesthesia Postprocedure Evaluation (Signed)
Anesthesia Post Note  Patient: Meghan [name]  Procedure(s) Performed: LEFT FOOT 3RD AND 4TH RAY AMPUTATION (Left)     Patient location during evaluation: PACU Anesthesia Type: MAC Level of consciousness: awake and alert Pain management: pain level controlled Vital Signs Assessment: post-procedure vital signs reviewed and stable Respiratory status: spontaneous breathing, nonlabored ventilation and respiratory function stable Cardiovascular status: blood pressure returned to baseline and stable Postop Assessment: no apparent nausea or vomiting Anesthetic complications: no   No notable events documented.  Last Vitals:  Vitals:   02/13/21 0838 02/13/21 0853  BP: 126/82   Pulse: 70 70  Resp: 16 16  Temp:  (!) 36.3 C  SpO2:  100%    Last Pain:  Vitals:   02/13/21 0645  TempSrc: Oral                 Lidia Collum

## 2021-02-13 NOTE — Transfer of Care (Signed)
Immediate Anesthesia Transfer of Care Note  Patient: Meghan [name]  Procedure(s) Performed: LEFT FOOT 3RD AND 4TH RAY AMPUTATION (Left)  Patient Location: PACU  Anesthesia Type:MAC  Level of Consciousness: awake, alert  and oriented  Airway & Oxygen Therapy: Patient Spontanous Breathing  Post-op Assessment: Report given to RN and Post -op Vital signs reviewed and stable  Post vital signs: Reviewed and stable  Last Vitals:  Vitals Value Taken Time  BP 124/55 02/13/21 0822  Temp    Pulse 69 02/13/21 0823  Resp 15 02/13/21 0823  SpO2 99 % 02/13/21 0823  Vitals shown include unvalidated device data.  Last Pain:  Vitals:   02/13/21 0645  TempSrc: Oral         Complications: No notable events documented.

## 2021-02-13 NOTE — Progress Notes (Signed)
Pt states she has a walker at hometo use as she can not use crutches safely & will use her own postop shoe from prior surgery

## 2021-02-14 ENCOUNTER — Encounter (HOSPITAL_COMMUNITY): Payer: Self-pay | Admitting: Orthopedic Surgery

## 2021-02-17 ENCOUNTER — Telehealth: Payer: Self-pay | Admitting: Orthopedic Surgery

## 2021-02-17 NOTE — Telephone Encounter (Signed)
IC advised.  

## 2021-02-17 NOTE — Telephone Encounter (Signed)
Pt just had surgery on 02/13/2021 and was wondering if she is allowed to drive to her appt on 02/25/2021.  CB [phone]

## 2021-02-17 NOTE — Telephone Encounter (Signed)
No too soon after surgery

## 2021-02-25 ENCOUNTER — Ambulatory Visit (INDEPENDENT_AMBULATORY_CARE_PROVIDER_SITE_OTHER): Payer: Medicare Other | Admitting: Physician Assistant

## 2021-02-25 ENCOUNTER — Encounter: Payer: Self-pay | Admitting: Physician Assistant

## 2021-02-25 DIAGNOSIS — M86272 Subacute osteomyelitis, left ankle and foot: Secondary | ICD-10-CM

## 2021-02-25 NOTE — Progress Notes (Signed)
Office Visit Note   Patient: Meghan [name]           Date of Birth: 1956-06-09           MRN: [id number] Visit Date: 02/25/2021              Requested by: Glenda Chroman, MD [address] [place],  [place] [address] PCP: Glenda Chroman, MD  Chief Complaint  Patient presents with   Left Foot - Routine Post Op    02/13/21 left foot 3rd and 4th ray amputation       HPI: Patient is a pleasant 65 year old woman who is 12 days status post left foot third and fourth ray amputation.  She did remove her wound VAC as it stopped functioning.  Assessment & Plan: Visit Diagnoses: No diagnosis found.  Plan: Patient will begin daily Dial cleansing we will apply clean dry dressing.  Follow-up in 1 week.  Follow-Up Instructions: No follow-ups on file.   Ortho Exam  Patient is alert, oriented, no adenopathy, well-dressed, normal affect, normal respiratory effort. Examination well apposed wound edges with just some slight skin maceration.  No wound dehiscence.  Overall swelling is fairly well controlled.  No drainage other than some bloody drainage no ascending cellulitis  Imaging: No results found. No images are attached to the encounter.  Labs: Lab Results  Component Value Date   HGBA1C 8.7 (H) 07/06/2012   HGBA1C (H) 04/01/2007    8.5 (NOTE)   The ADA recommends the following therapeutic goals for glycemic   control related to Hgb A1C measurement:   Goal of Therapy:   < 7.0% Hgb A1C   Action Suggested:  > 8.0% Hgb A1C   Ref:  Diabetes Care, 22, Suppl. 1, 1999   LABURIC 6.9 06/15/2007   REPTSTATUS 07/02/2012 FINAL 06/30/2012   GRAMSTAIN  06/30/2012    RARE WBC PRESENT, PREDOMINANTLY MONONUCLEAR RARE SQUAMOUS EPITHELIAL CELLS PRESENT FEW GRAM POSITIVE COCCI IN CHAINS IN PAIRS   CULT  06/30/2012    FEW GROUP B STREP(S.AGALACTIAE)ISOLATED Note: TESTING AGAINST S. AGALACTIAE NOT ROUTINELY PERFORMED DUE TO PREDICTABILITY OF AMP/PEN/VAN SUSCEPTIBILITY.     Lab Results  Component  Value Date   ALBUMIN 4.5 03/30/2014   ALBUMIN 2.7 (L) 07/01/2012   ALBUMIN 2.3 (L) 06/15/2007    No results found for: MG No results found for: VD25OH  No results found for: PREALBUMIN CBC EXTENDED Latest Ref Rng & Units 02/13/2021 03/30/2014 07/06/2012  WBC 4.0 - 10.5 K/uL - 11.8(H) 11.7(H)  RBC 3.87 - 5.11 MIL/uL - 4.57 3.67(L)  HGB 12.0 - 15.0 g/dL 13.3 12.5 9.8(L)  HCT 36.0 - 46.0 % 39.0 39.9 32.0(L)  PLT 150 - 400 K/uL - 246 361  NEUTROABS 1.7 - 7.7 K/uL - 7.0 -  LYMPHSABS 0.7 - 4.0 K/uL - 3.0 -     There is no height or weight on file to calculate BMI.  Orders:  No orders of the defined types were placed in this encounter.  No orders of the defined types were placed in this encounter.    Procedures: No procedures performed  Clinical Data: No additional findings.  ROS:  All other systems negative, except as noted in the HPI. Review of Systems  Objective: Vital Signs: There were no vitals taken for this visit.  Specialty Comments:  No specialty comments available.  PMFS History: Patient Active Problem List   Diagnosis Date Noted   Osteomyelitis of third toe of left foot ([place])  History of gout 06/30/2012   DM (diabetes mellitus), type 2, uncontrolled, periph vascular complic ([place]) 83/66/2947   HTN (hypertension) 06/30/2012   Cellulitis of leg 06/30/2012   ARF (acute renal failure) ([place]) 06/30/2012   Past Medical History:  Diagnosis Date   Anemia 2020   iron infusions   Anxiety    Cellulitis 06/30/2012   RLE   Depression    Diabetic peripheral neuropathy (H[facility])    GERD (gastroesophageal reflux disease)    Headache    High cholesterol    Hypertension    Seasonal allergies    Type II diabetes mellitus ([place])    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;  Service: Orthopedics;  Laterality: Left;   INCISION AND DRAINAGE OF WOUND  03/10/2007   "mediastinitis", left medial collarbone (06/30/2012)   LYMPHADENECTOMY  08/09/2005   "bilateral groins; had cancer vulva" (06/30/2012)   TUBAL LIGATION     VULVA SURGERY  08/09/2005   "took cancer off" (06/30/2012)   WISDOM TOOTH EXTRACTION     Social History   Occupational History   Not on file  Tobacco Use   Smoking status: Former    Packs/day: 1.00    Types: Cigarettes    Quit date: 11/1998    Years since quitting: 22.3   Smokeless tobacco: Never   Tobacco comments:    06/30/2012 "quit cigarette smoking in 1999"  Vaping Use   Vaping Use: Never used  Substance and Sexual Activity   Alcohol use: Not Currently   Drug use: No   Sexual activity: Not Currently    Birth control/protection: Post-menopausal

## 2021-03-04 ENCOUNTER — Ambulatory Visit (INDEPENDENT_AMBULATORY_CARE_PROVIDER_SITE_OTHER): Payer: Medicare Other | Admitting: Family

## 2021-03-04 DIAGNOSIS — M869 Osteomyelitis, unspecified: Secondary | ICD-10-CM

## 2021-03-04 NOTE — Progress Notes (Signed)
Post-Op Visit Note   Patient: Meghan [name]           Date of Birth: 1956/05/22           MRN: [id number] Visit Date: 03/04/2021 PCP: Glenda Chroman, MD  Chief Complaint:  Chief Complaint  Patient presents with   Left Foot - Routine Post Op    02/13/2021 3rd and 4th ray amputation     HPI:  HPI The patient is a 65 year old woman seen today status post left foot third and fourth ray amputations.  Sutures are in place.  She has been full weightbearing in a postop shoe Ortho Exam Incision well approximated with sutures is healing well.  Its not quite completely healed there is no surrounding erythema or drainage no sign of infection  Visit Diagnoses: No diagnosis found.  Plan: She will continue daily Dial soap cleansing dry dressings.  Minimize weightbearing.  Anticipate harvesting sutures in 1 more week advancing to regular shoewear at that time  Follow-Up Instructions: No follow-ups on file.   Imaging: No results found.  Orders:  No orders of the defined types were placed in this encounter.  No orders of the defined types were placed in this encounter.    PMFS History: Patient Active Problem List   Diagnosis Date Noted   Osteomyelitis of third toe of left foot ([place])    History of gout 06/30/2012   DM (diabetes mellitus), type 2, uncontrolled, periph vascular complic ([place]) 123456   HTN (hypertension) 06/30/2012   Cellulitis of leg 06/30/2012   ARF (acute renal failure) ([place]) 06/30/2012   Past Medical History:  Diagnosis Date   Anemia 2020   iron infusions   Anxiety    Cellulitis 06/30/2012   RLE   Depression    Diabetic peripheral neuropathy (HCC)    GERD (gastroesophageal reflux disease)    Headache    High cholesterol    Hypertension    Seasonal allergies    Type II diabetes mellitus ([place])    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    Family History  Adopted: Yes    Past Surgical History:  Procedure Laterality Date   AMPUTATION  ~ 2008   "2nd  toe of right foot" (06/30/2012)   AMPUTATION  07/04/2012   Procedure: AMPUTATION RAY;  Surgeon: Newt Minion, MD;  Location: [facility];  Service: Orthopedics;  Laterality: Right;  great toe  amp vs ray amp   AMPUTATION Left 02/13/2021   Procedure: LEFT FOOT 3RD AND 4TH RAY AMPUTATION;  Surgeon: Newt Minion, MD;  Location: [facility];  Service: Orthopedics;  Laterality: Left;   INCISION AND DRAINAGE OF WOUND  03/10/2007   "mediastinitis", left medial collarbone (06/30/2012)   LYMPHADENECTOMY  08/09/2005   "bilateral groins; had cancer vulva" (06/30/2012)   TUBAL LIGATION     VULVA SURGERY  08/09/2005   "took cancer off" (06/30/2012)   WISDOM TOOTH EXTRACTION     Social History   Occupational History   Not on file  Tobacco Use   Smoking status: Former    Packs/day: 1.00    Types: Cigarettes    Quit date: 11/1998    Years since quitting: 22.3   Smokeless tobacco: Never   Tobacco comments:    06/30/2012 "quit cigarette smoking in 1999"  Vaping Use   Vaping Use: Never used  Substance and Sexual Activity   Alcohol use: Not Currently   Drug use: No   Sexual activity: Not Currently    Birth  control/protection: Post-menopausal

## 2021-03-11 ENCOUNTER — Encounter: Payer: Self-pay | Admitting: Family

## 2021-03-11 ENCOUNTER — Ambulatory Visit (INDEPENDENT_AMBULATORY_CARE_PROVIDER_SITE_OTHER): Payer: Medicare Other | Admitting: Family

## 2021-03-11 DIAGNOSIS — Z89432 Acquired absence of left foot: Secondary | ICD-10-CM

## 2021-03-11 NOTE — Progress Notes (Signed)
Post-Op Visit Note   Patient: Meghan [name]           Date of Birth: 1955-09-15           MRN: [id number] Visit Date: 03/11/2021 PCP: Glenda Chroman, MD  Chief Complaint:  Chief Complaint  Patient presents with   Left Foot - Routine Post Op    02/13/21 left foot 3rd and 4th ray amputation     HPI:  HPI The patient is a 65 year old woman seen status post left third and fourth ray amputations she has been full weightbearing in a postop shoe and has begun driving has no concerns doing daily dry dressing changes Ortho Exam Incision is well-healed to the left foot sutures harvested today without incident no gaping or drainage.  Mild erythema with dry peeling skin some resolving edema to the left lower extremity up to the tibial tubercle  Visit Diagnoses: No diagnosis found.  Plan: Sutures harvested.  She will advance her weightbearing in regular shoewear as tolerated.  Follow-up in office as needed  Follow-Up Instructions: No follow-ups on file.   Imaging: No results found.  Orders:  No orders of the defined types were placed in this encounter.  No orders of the defined types were placed in this encounter.    PMFS History: Patient Active Problem List   Diagnosis Date Noted   Osteomyelitis of third toe of left foot ([place])    History of gout 06/30/2012   DM (diabetes mellitus), type 2, uncontrolled, periph vascular complic ([place]) 123456   HTN (hypertension) 06/30/2012   Cellulitis of leg 06/30/2012   ARF (acute renal failure) ([facility]) 06/30/2012   Past Medical History:  Diagnosis Date   Anemia 2020   iron infusions   Anxiety    Cellulitis 06/30/2012   RLE   Depression    Diabetic peripheral neuropathy (HCC)    GERD (gastroesophageal reflux disease)    Headache    High cholesterol    Hypertension    Seasonal allergies    Type II diabetes mellitus ([place])    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    Family History  Adopted: Yes    Past Surgical History:   Procedure Laterality Date   AMPUTATION  ~ 2008   "2nd toe of right foot" (06/30/2012)   AMPUTATION  07/04/2012   Procedure: AMPUTATION RAY;  Surgeon: Newt Minion, MD;  Location: [facility];  Service: Orthopedics;  Laterality: Right;  great toe  amp vs ray amp   AMPUTATION Left 02/13/2021   Procedure: LEFT FOOT 3RD AND 4TH RAY AMPUTATION;  Surgeon: Newt Minion, MD;  Location: [facility];  Service: Orthopedics;  Laterality: Left;   INCISION AND DRAINAGE OF WOUND  03/10/2007   "mediastinitis", left medial collarbone (06/30/2012)   LYMPHADENECTOMY  08/09/2005   "bilateral groins; had cancer vulva" (06/30/2012)   TUBAL LIGATION     VULVA SURGERY  08/09/2005   "took cancer off" (06/30/2012)   WISDOM TOOTH EXTRACTION     Social History   Occupational History   Not on file  Tobacco Use   Smoking status: Former    Packs/day: 1.00    Types: Cigarettes    Quit date: 11/1998    Years since quitting: 22.3   Smokeless tobacco: Never   Tobacco comments:    06/30/2012 "quit cigarette smoking in 1999"  Vaping Use   Vaping Use: Never used  Substance and Sexual Activity   Alcohol use: Not Currently   Drug use: No  Sexual activity: Not Currently    Birth control/protection: Post-menopausal

## 2022-10-28 ENCOUNTER — Ambulatory Visit (INDEPENDENT_AMBULATORY_CARE_PROVIDER_SITE_OTHER): Payer: Medicare Other | Admitting: Orthopedic Surgery

## 2022-10-28 ENCOUNTER — Other Ambulatory Visit (INDEPENDENT_AMBULATORY_CARE_PROVIDER_SITE_OTHER): Payer: Medicare Other

## 2022-10-28 ENCOUNTER — Encounter: Payer: Self-pay | Admitting: Orthopedic Surgery

## 2022-10-28 DIAGNOSIS — M25511 Pain in right shoulder: Secondary | ICD-10-CM | POA: Diagnosis not present

## 2022-10-28 DIAGNOSIS — M25512 Pain in left shoulder: Secondary | ICD-10-CM

## 2022-10-28 DIAGNOSIS — M4722 Other spondylosis with radiculopathy, cervical region: Secondary | ICD-10-CM | POA: Diagnosis not present

## 2022-10-28 DIAGNOSIS — G8929 Other chronic pain: Secondary | ICD-10-CM

## 2022-10-28 DIAGNOSIS — R200 Anesthesia of skin: Secondary | ICD-10-CM | POA: Diagnosis not present

## 2022-10-28 MED ORDER — PREDNISONE 10 MG PO TABS: 10.0000 mg | ORAL_TABLET | Freq: Every day | ORAL | 0 refills | Status: DC

## 2022-10-28 NOTE — Progress Notes (Signed)
Office Visit Note   Patient: Meghan [name]           Date of Birth: 23-Feb-1956           MRN: [id number] Visit Date: 10/28/2022              Requested by: Glenda Chroman, MD [address] [place],  [place] [address] PCP: Glenda Chroman, MD  Chief Complaint  Patient presents with   Right Shoulder - Pain   Left Shoulder - Pain      HPI: Patient is a 67 year old woman who presents with decreasing range of motion of both of both shoulders and numbness in both hands.  Patient complains of circumferential numbness in both hands but no neck pain no weakness in her upper extremities.    Assessment & Plan: Visit Diagnoses:  1. Numbness of upper extremity   2. Chronic pain of both shoulders   3. Osteoarthritis of spine with radiculopathy, cervical region     Plan:   Start her on a low-dose prednisone 10 mg with breakfast to see if this helps with the radicular numbness in both hands.  Discussed that if this does not help she should call and we would set her up for an MRI scan of her cervical spine.  With the advanced arthritic pain in both shoulders and superior migration of the humeral head against the acromion with complete rotator cuff tear recommended patient follow-up with Dr. Marlou Sa for evaluation of reverse total shoulder and the right shoulder.   Follow-Up Instructions: No follow-ups on file.   Ortho Exam  Patient is alert, oriented, no adenopathy, well-dressed, normal affect, normal respiratory effort. Examination patient has active abduction and flexion of only about 45 degrees in the right shoulder abduction and flexion of the left shoulder of about 90 degrees.  She has crepitation with range of motion of both shoulders.  She has a complete rotator cuff bilaterally.  Examination of both upper extremities she has no focal motor weakness there is a good grip strength bilaterally but subjective numbness in both hands circumferentially.  Imaging: XR Cervical Spine 2 or 3  views  Result Date: 10/28/2022 2 view radiographs of the lumbar spine shows degenerative disc disease with advanced degenerative disc disease at C5-6 with bone-on-bone and osteophytic bone spurs.  XR Shoulder Left  Result Date: 10/28/2022 2 view radiographs of the left shoulder shows superior migration of the humeral head within the glenoid with the humeral head resting on the acromion.  XR Shoulder Right  Result Date: 10/28/2022 2 view radiographs of the right shoulder shows superior migration of the humeral head within the glenoid and the humeral head resting on the acromion.  No images are attached to the encounter.  Labs: Lab Results  Component Value Date   HGBA1C 8.7 (H) 07/06/2012   HGBA1C (H) 04/01/2007    8.5 (NOTE)   The ADA recommends the following therapeutic goals for glycemic   control related to Hgb A1C measurement:   Goal of Therapy:   < 7.0% Hgb A1C   Action Suggested:  > 8.0% Hgb A1C   Ref:  Diabetes Care, 22, Suppl. 1, 1999   LABURIC 6.9 06/15/2007   REPTSTATUS 07/02/2012 FINAL 06/30/2012   GRAMSTAIN  06/30/2012    RARE WBC PRESENT, PREDOMINANTLY MONONUCLEAR RARE SQUAMOUS EPITHELIAL CELLS PRESENT FEW GRAM POSITIVE COCCI IN CHAINS IN PAIRS   CULT  06/30/2012    FEW GROUP B STREP(S.AGALACTIAE)ISOLATED Note: TESTING AGAINST S. AGALACTIAE NOT ROUTINELY  PERFORMED DUE TO PREDICTABILITY OF AMP/PEN/VAN SUSCEPTIBILITY.     Lab Results  Component Value Date   ALBUMIN 4.5 03/30/2014   ALBUMIN 2.7 (L) 07/01/2012   ALBUMIN 2.3 (L) 06/15/2007    No results found for: "MG" No results found for: "VD25OH"  No results found for: "PREALBUMIN"    Latest Ref Rng & Units 02/13/2021    7:27 AM 03/30/2014    8:21 PM 07/06/2012    5:32 AM  CBC EXTENDED  WBC 4.0 - 10.5 K/uL  11.8  11.7   RBC 3.87 - 5.11 MIL/uL  4.57  3.67   Hemoglobin 12.0 - 15.0 g/dL 13.3  12.5  9.8   HCT 36.0 - 46.0 % 39.0  39.9  32.0   Platelets 150 - 400 K/uL  246  361   NEUT# 1.7 - 7.7 K/uL  7.0     Lymph# 0.7 - 4.0 K/uL  3.0       There is no height or weight on file to calculate BMI.  Orders:  Orders Placed This Encounter  Procedures   XR Cervical Spine 2 or 3 views   XR Shoulder Right   XR Shoulder Left   No orders of the defined types were placed in this encounter.    Procedures: No procedures performed  Clinical Data: No additional findings.  ROS:  All other systems negative, except as noted in the HPI. Review of Systems  Objective: Vital Signs: There were no vitals taken for this visit.  Specialty Comments:  No specialty comments available.  PMFS History: Patient Active Problem List   Diagnosis Date Noted   Osteomyelitis of third toe of left foot ([facility])    History of gout 06/30/2012   DM (diabetes mellitus), type 2, uncontrolled, periph vascular complic 123456   HTN (hypertension) 06/30/2012   Cellulitis of leg 06/30/2012   ARF (acute renal failure) ([facility]) 06/30/2012   Past Medical History:  Diagnosis Date   Anemia 2020   iron infusions   Anxiety    Cellulitis 06/30/2012   RLE   Depression    Diabetic peripheral neuropathy (HCC)    GERD (gastroesophageal reflux disease)    Headache    High cholesterol    Hypertension    Seasonal allergies    Type II diabetes mellitus ([facility])    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    Family History  Adopted: Yes    Past Surgical History:  Procedure Laterality Date   AMPUTATION  ~ 2008   "2nd toe of right foot" (06/30/2012)   AMPUTATION  07/04/2012   Procedure: AMPUTATION RAY;  Surgeon: Newt Minion, MD;  Location: [facility];  Service: Orthopedics;  Laterality: Right;  great toe  amp vs ray amp   AMPUTATION Left 02/13/2021   Procedure: LEFT FOOT 3RD AND 4TH RAY AMPUTATION;  Surgeon: Newt Minion, MD;  Location: [facility];  Service: Orthopedics;  Laterality: Left;   INCISION AND DRAINAGE OF WOUND  03/10/2007   "mediastinitis", left medial collarbone (06/30/2012)   LYMPHADENECTOMY  08/09/2005   "bilateral groins;  had cancer vulva" (06/30/2012)   TUBAL LIGATION     VULVA SURGERY  08/09/2005   "took cancer off" (06/30/2012)   WISDOM TOOTH EXTRACTION     Social History   Occupational History   Not on file  Tobacco Use   Smoking status: Former    Packs/day: 1    Types: Cigarettes    Quit date: 11/1998    Years since quitting: 23.9  Smokeless tobacco: Never   Tobacco comments:    06/30/2012 "quit cigarette smoking in 1999"  Vaping Use   Vaping Use: Never used  Substance and Sexual Activity   Alcohol use: Not Currently   Drug use: No   Sexual activity: Not Currently    Birth control/protection: Post-menopausal

## 2022-11-11 ENCOUNTER — Ambulatory Visit (INDEPENDENT_AMBULATORY_CARE_PROVIDER_SITE_OTHER): Payer: Medicare Other | Admitting: Orthopedic Surgery

## 2022-11-11 DIAGNOSIS — M19011 Primary osteoarthritis, right shoulder: Secondary | ICD-10-CM | POA: Diagnosis not present

## 2022-11-13 ENCOUNTER — Encounter: Payer: Self-pay | Admitting: Orthopedic Surgery

## 2022-11-13 NOTE — Progress Notes (Signed)
Office Visit Note   Patient: Meghan [name]           Date of Birth: Jan 27, 1956           MRN: [id number] Visit Date: 11/11/2022 Requested by: Ignatius Specking, MD [address] [place],  [place] [address] PCP: Ignatius Specking, MD  Subjective: Chief Complaint  Patient presents with   Right Shoulder - Pain    HPI: Meghan [name] is a 67 y.o. female who presents to the office reporting bilateral shoulder pain right worse than left.  She describes significant decrease in range of motion.  She is right-hand dominant.  She has been going to a chiropractor for sciatic pain.  Has a history of left clavicle bone infection years ago which was around the sternoclavicular joint which has resolved.  Takes Mobic and prednisone with minimal relief.  She has diabetes but her last A1c was approximately 83.  67 year old person stays with her at home.  No cardiac issues.  She does have difficulties with ADLs..                ROS: All systems reviewed are negative as they relate to the chief complaint within the history of present illness.  Patient denies fevers or chills.  Assessment & Plan: Visit Diagnoses:  1. Arthritis of right shoulder region     Plan: Impression is severe rotator cuff arthropathy in both shoulders right worse than left.  She has significant limitation of forward flexion and abduction.  Deltoid does fire.  Plan is thin cut CT scan for preop planning and patient specific instrumentation for reverse shoulder replacement.  The risk and benefits of reverse shoulder replacement are discussed with the patient include not limited to infection or vessel damage incomplete pain restoration as well as incomplete functional restoration.  Overall I think it is likely compared to where she is now she should be able to get her hand up overhead and back behind her head.  Patient understands risk and benefits.  All questions answered.  Anticipate 2 night hospital stay for Meghan [name] due to lack of family  support.  Follow-Up Instructions: No follow-ups on file.   Orders:  Orders Placed This Encounter  Procedures   CT SHOULDER RIGHT WO CONTRAST   No orders of the defined types were placed in this encounter.     Procedures: No procedures performed   Clinical Data: No additional findings.  Objective: Vital Signs: There were no vitals taken for this visit.  Physical Exam:  Constitutional: Patient appears well-developed HEENT:  Head: Normocephalic Eyes:EOM are normal Neck: Normal range of motion Cardiovascular: Normal rate Pulmonary/chest: Effort normal Neurologic: Patient is alert Skin: Skin is warm Psychiatric: Patient has normal mood and affect  Ortho Exam: Ortho exam demonstrates forward flexion abduction about 40 degrees on the right-hand side.  Deltoid fires.  Motor or sensory function to the hand is intact but she does report episodic paresthesias.  Negative Tinel's on the right elbow at the ulnar nerve.  Radial pulses intact.  Passive range of motion is about 90 degrees of abduction and forward flexion.  Actively it is about half that.  She does have tenderness around the shoulder joint as well as in the bicipital groove with no Popeye deformity.  Specialty Comments:  No specialty comments available.  Imaging: No results found.   PMFS History: Patient Active Problem List   Diagnosis Date Noted   Osteomyelitis of third toe of left foot  History of gout 06/30/2012   DM (diabetes mellitus), type 2, uncontrolled, periph vascular complic 06/30/2012   HTN (hypertension) 06/30/2012   Cellulitis of leg 06/30/2012   ARF (acute renal failure) 06/30/2012   Past Medical History:  Diagnosis Date   Anemia 2020   iron infusions   Anxiety    Cellulitis 06/30/2012   RLE   Depression    Diabetic peripheral neuropathy    GERD (gastroesophageal reflux disease)    Headache    High cholesterol    Hypertension    Seasonal allergies    Type II diabetes mellitus     Vulvar cancer 08/09/2005    Family History  Adopted: Yes    Past Surgical History:  Procedure Laterality Date   AMPUTATION  ~ 2008   "2nd toe of right foot" (06/30/2012)   AMPUTATION  07/04/2012   Procedure: AMPUTATION RAY;  Surgeon: Nadara Mustard, MD;  Location: [facility];  Service: Orthopedics;  Laterality: Right;  great toe  amp vs ray amp   AMPUTATION Left 02/13/2021   Procedure: LEFT FOOT 3RD AND 4TH RAY AMPUTATION;  Surgeon: Nadara Mustard, MD;  Location: [facility];  Service: Orthopedics;  Laterality: Left;   INCISION AND DRAINAGE OF WOUND  03/10/2007   "mediastinitis", left medial collarbone (06/30/2012)   LYMPHADENECTOMY  08/09/2005   "bilateral groins; had cancer vulva" (06/30/2012)   TUBAL LIGATION     VULVA SURGERY  08/09/2005   "took cancer off" (06/30/2012)   WISDOM TOOTH EXTRACTION     Social History   Occupational History   Not on file  Tobacco Use   Smoking status: Former    Packs/day: 1    Types: Cigarettes    Quit date: 11/1998    Years since quitting: 24.0   Smokeless tobacco: Never   Tobacco comments:    06/30/2012 "quit cigarette smoking in 1999"  Vaping Use   Vaping Use: Never used  Substance and Sexual Activity   Alcohol use: Not Currently   Drug use: No   Sexual activity: Not Currently    Birth control/protection: Post-menopausal

## 2022-12-09 ENCOUNTER — Ambulatory Visit
Admission: RE | Admit: 2022-12-09 | Discharge: 2022-12-09 | Disposition: A | Discharge status: Home / Self Care | Payer: Medicare Other | Source: Ambulatory Visit | Attending: Orthopedic Surgery | Admitting: Orthopedic Surgery

## 2022-12-09 DIAGNOSIS — M19011 Primary osteoarthritis, right shoulder: Secondary | ICD-10-CM

## 2022-12-13 ENCOUNTER — Other Ambulatory Visit: Payer: Medicare Other

## 2023-01-05 NOTE — Pre-Procedure Instructions (Signed)
Surgical Instructions    Your procedure is scheduled on Thursday, June 6th.  Report to [facility] Main Entrance "A" at 05:30 A.M., then check in with the Admitting office.  Call this number if you have problems the morning of surgery:  [phone]  If you have any questions prior to your surgery date call [phone]: Open Monday-Friday 8am-4pm If you experience any cold or flu symptoms such as cough, fever, chills, shortness of breath, etc. between now and your scheduled surgery, please notify us at the above number.     Remember:  Do not eat after midnight the night before your surgery  You may drink clear liquids until 04:30 AM the morning of your surgery.   Clear liquids allowed are: Water, Non-Citrus Juices (without pulp), Carbonated Beverages, Clear Tea, Black Coffee Only (NO MILK, CREAM OR POWDERED CREAMER of any kind), and Gatorade.   Patient Instructions  The night before surgery:  No food after midnight. ONLY clear liquids after midnight   The day of surgery (if you have diabetes): Drink ONE (1) 12 oz G2 given to you in your pre admission testing appointment by 04:30 AM the morning of surgery. Drink in one sitting. Do not sip.  This drink was given to you during your hospital  pre-op appointment visit.  Nothing else to drink after completing the  12 oz bottle of G2.         If you have questions, please contact your surgeon's office.     Take these medicines the morning of surgery with A SIP OF WATER  cetirizine (ZYRTEC)  FLUoxetine (PROZAC)  metoprolol (LOPRESSOR)  pantoprazole (PROTONIX)  pregabalin (LYRICA)    As of today, STOP taking any Aspirin (unless otherwise instructed by your surgeon) Aleve, Naproxen, Ibuprofen, Motrin, Advil, Goody's, BC's, meloxicam (MOBIC), all herbal medications, fish oil, and all vitamins.      WHAT DO I DO ABOUT MY DIABETES MEDICATION?   HOLD Dulaglutide (TRULICITY) 7 DAYS PRIOR TO SURGERY. Last dose 6/26.  THE NIGHT BEFORE  SURGERY, take 37 units (50%) of insulin glargine (LANTUS).     OR  THE MORNING OF SURGERY, take 37 units (50%) of insulin glargine (LANTUS).   If your CBG is greater than 220 mg/dL, you may take  of your sliding scale (correction) dose of insulin lispro (HUMALOG).   HOW TO MANAGE YOUR DIABETES BEFORE AND AFTER SURGERY  Why is it important to control my blood sugar before and after surgery? Improving blood sugar levels before and after surgery helps healing and can limit problems. A way of improving blood sugar control is eating a healthy diet by:  Eating less sugar and carbohydrates  Increasing activity/exercise  Talking with your doctor about reaching your blood sugar goals High blood sugars (greater than 180 mg/dL) can raise your risk of infections and slow your recovery, so you will need to focus on controlling your diabetes during the weeks before surgery. Make sure that the doctor who takes care of your diabetes knows about your planned surgery including the date and location.  How do I manage my blood sugar before surgery? Check your blood sugar at least 4 times a day, starting 2 days before surgery, to make sure that the level is not too high or low.  Check your blood sugar the morning of your surgery when you wake up and every 2 hours until you get to the Short Stay unit.  If your blood sugar is less than 70 mg/dL, you will need  to treat for low blood sugar: Do not take insulin. Treat a low blood sugar (less than 70 mg/dL) with  cup of clear juice (cranberry or apple), 4 glucose tablets, OR glucose gel. Recheck blood sugar in 15 minutes after treatment (to make sure it is greater than 70 mg/dL). If your blood sugar is not greater than 70 mg/dL on recheck, call [phone] for further instructions. Report your blood sugar to the short stay nurse when you get to Short Stay.  If you are admitted to the hospital after surgery: Your blood sugar will be checked by the staff and  you will probably be given insulin after surgery (instead of oral diabetes medicines) to make sure you have good blood sugar levels. The goal for blood sugar control after surgery is 80-180 mg/dL.                Do NOT Smoke (Tobacco/Vaping) for 24 hours prior to your procedure.  If you use a CPAP at night, you may bring your mask/headgear for your overnight stay.   Contacts, glasses, piercing's, hearing aid's, dentures or partials may not be worn into surgery, please bring cases for these belongings.    For patients admitted to the hospital, discharge time will be determined by your treatment team.   Patients discharged the day of surgery will not be allowed to drive home, and someone needs to stay with them for 24 hours.  SURGICAL WAITING ROOM VISITATION Patients having surgery or a procedure may have no more than 2 support people in the waiting area - these visitors may rotate.   Children under the age of 57 must have an adult with them who is not the patient. If the patient needs to stay at the hospital during part of their recovery, the visitor guidelines for inpatient rooms apply. Pre-op nurse will coordinate an appropriate time for 1 support person to accompany patient in pre-op.  This support person may not rotate.   Please refer to the [facility] website for the visitor guidelines for Inpatients (after your surgery is over and you are in a regular room).      Pre-operative 5 CHG Bath Instructions   You can play a key role in reducing the risk of infection after surgery. Your skin needs to be as free of germs as possible. You can reduce the number of germs on your skin by washing with CHG (chlorhexidine gluconate) soap before surgery. CHG is an antiseptic soap that kills germs and continues to kill germs even after washing.   DO NOT use if you have an allergy to chlorhexidine/CHG or antibacterial soaps. If your skin becomes reddened or irritated, stop using the CHG and notify one  of our RNs at [phone].   Please shower with the CHG soap starting 4 days before surgery using the following schedule:     Please keep in mind the following:  DO NOT shave, including legs and underarms, starting the day of your first shower.   You may shave your face at any point before/day of surgery.  Place clean sheets on your bed the day you start using CHG soap. Use a clean washcloth (not used since being washed) for each shower. DO NOT sleep with pets once you start using the CHG.   CHG Shower Instructions:  If you choose to wash your hair and private area, wash first with your normal shampoo/soap.  After you use shampoo/soap, rinse your hair and body thoroughly to remove shampoo/soap residue.  Turn  the water OFF and apply about 3 tablespoons (45 ml) of CHG soap to a CLEAN washcloth.  Apply CHG soap ONLY FROM YOUR NECK DOWN TO YOUR TOES (washing for 3-5 minutes)  DO NOT use CHG soap on face, private areas, open wounds, or sores.  Pay special attention to the area where your surgery is being performed.  If you are having back surgery, having someone wash your back for you may be helpful. Wait 2 minutes after CHG soap is applied, then you may rinse off the CHG soap.  Pat dry with a clean towel  Put on clean clothes/pajamas   If you choose to wear lotion, please use ONLY the CHG-compatible lotions on the back of this paper.     Additional instructions for the day of surgery: DO NOT APPLY any lotions, deodorants, cologne, or perfumes.   Put on clean/comfortable clothes.  Brush your teeth.  Ask your nurse before applying any prescription medications to the skin. Do not wear jewelry or makeup Do not bring valuables to the hospital. [facility] is not responsible for any belongings or valuables. Do not wear nail polish, gel polish, artificial nails, or any other type of covering on natural nails (fingers and toes) If you have artificial nails or gel coating that need to be  removed by a nail salon, please have this removed prior to surgery. Artificial nails or gel coating may interfere with anesthesia's ability to adequately monitor your vital signs.    [place]- Preparing for Total Shoulder Arthroplasty  Before surgery, you can play an important role. Because skin is not sterile, your skin needs to be as free of germs as possible. You can reduce the number of germs on your skin by using the following products.   Benzoyl Peroxide Gel  o Reduces the number of germs present on the skin  o Applied twice a day to shoulder area starting two days before surger   ==================================================================  Please follow these instructions carefully:  BENZOYL PEROXIDE 5% GEL  Please do not use if you have an allergy to benzoyl peroxide. If your skin becomes reddened/irritated stop using the benzoyl peroxide.  Starting two days before surgery, apply as follows:  1. Apply benzoyl peroxide in the morning and at night. Apply after taking a shower. If you are not taking a shower clean entire shoulder front, back, and side along with the armpit with a clean wet washcloth.  2. Place a quarter-sized dollop on your SHOULDER and rub in thoroughly, making sure to cover the front, back, and side of your shoulder, along with the armpit.   2 Days prior to Surgery First Dose on _____________ Morning Second Dose on ______________ Night  Day Before Surgery First Dose on ______________ Morning Night before surgery wash (entire body except face and private areas) with CHG Soap THEN Second Dose on ____________ Night   Morning of Surgery  wash BODY AGAIN with CHG Soap   4. Do NOT apply benzoyl peroxide gel on the day of surgery          CHG Compatible Lotions   Aveeno Moisturizing lotion  Cetaphil Moisturizing Cream  Cetaphil Moisturizing Lotion  Clairol Herbal Essence Moisturizing Lotion, Dry Skin  Clairol Herbal Essence  Moisturizing Lotion, Extra Dry Skin  Clairol Herbal Essence Moisturizing Lotion, Normal Skin  Curel Age Defying Therapeutic Moisturizing Lotion with Alpha Hydroxy  Curel Extreme Care Body Lotion  Curel Soothing Hands Moisturizing Hand Lotion  Curel Therapeutic Moisturizing Cream, Fragrance-Free  Curel  Therapeutic Moisturizing Lotion, Fragrance-Free  Curel Therapeutic Moisturizing Lotion, Original Formula  Eucerin Daily Replenishing Lotion  Eucerin Dry Skin Therapy Plus Alpha Hydroxy Crme  Eucerin Dry Skin Therapy Plus Alpha Hydroxy Lotion  Eucerin Original Crme  Eucerin Original Lotion  Eucerin Plus Crme Eucerin Plus Lotion  Eucerin TriLipid Replenishing Lotion  Keri Anti-Bacterial Hand Lotion  Keri Deep Conditioning Original Lotion Dry Skin Formula Softly Scented  Keri Deep Conditioning Original Lotion, Fragrance Free Sensitive Skin Formula  Keri Lotion Fast Absorbing Fragrance Free Sensitive Skin Formula  Keri Lotion Fast Absorbing Softly Scented Dry Skin Formula  Keri Original Lotion  Keri Skin Renewal Lotion Keri Silky Smooth Lotion  Keri Silky Smooth Sensitive Skin Lotion  Nivea Body Creamy Conditioning Oil  Nivea Body Extra Enriched Lotion  Nivea Body Original Lotion  Nivea Body Sheer Moisturizing Lotion Nivea Crme  Nivea Skin Firming Lotion  NutraDerm 30 Skin Lotion  NutraDerm Skin Lotion  NutraDerm Therapeutic Skin Cream  NutraDerm Therapeutic Skin Lotion  ProShield Protective Hand Cream  Provon moisturizing lotion        Please read over the following fact sheets that you were given.    If you received a COVID test during your pre-op visit  it is requested that you wear a mask when out in public, stay away from anyone that may not be feeling well and notify your surgeon if you develop symptoms. If you have been in contact with anyone that has tested positive in the last 10 days please notify you surgeon.

## 2023-01-06 ENCOUNTER — Other Ambulatory Visit: Payer: Self-pay

## 2023-01-06 ENCOUNTER — Encounter (HOSPITAL_COMMUNITY): Payer: Self-pay

## 2023-01-06 ENCOUNTER — Encounter (HOSPITAL_COMMUNITY)
Admission: RE | Admit: 2023-01-06 | Discharge: 2023-01-06 | Disposition: A | Discharge status: Home / Self Care | Payer: Medicare Other | Source: Ambulatory Visit | Attending: Orthopedic Surgery | Admitting: Orthopedic Surgery

## 2023-01-06 VITALS — BP 127/68 | HR 77 | Temp 98.3°F | Resp 18 | Ht 67.0 in | Wt 193.0 lb

## 2023-01-06 DIAGNOSIS — Z01818 Encounter for other preprocedural examination: Secondary | ICD-10-CM | POA: Diagnosis present

## 2023-01-06 DIAGNOSIS — E119 Type 2 diabetes mellitus without complications: Secondary | ICD-10-CM | POA: Diagnosis not present

## 2023-01-06 HISTORY — DX: Unspecified osteoarthritis, unspecified site: M19.90

## 2023-01-06 LAB — URINALYSIS, ROUTINE W REFLEX MICROSCOPIC
Bilirubin Urine: NEGATIVE
Glucose, UA: 50 mg/dL — AB
Hgb urine dipstick: NEGATIVE
Ketones, ur: NEGATIVE mg/dL
Nitrite: NEGATIVE
Protein, ur: 30 mg/dL — AB
Specific Gravity, Urine: 1.01 (ref 1.005–1.030)
pH: 5 (ref 5.0–8.0)

## 2023-01-06 LAB — BASIC METABOLIC PANEL
Anion gap: 11 (ref 5–15)
BUN: 17 mg/dL (ref 8–23)
CO2: 24 mmol/L (ref 22–32)
Calcium: 8.7 mg/dL — ABNORMAL LOW (ref 8.9–10.3)
Chloride: 103 mmol/L (ref 98–111)
Creatinine, Ser: 1.43 mg/dL — ABNORMAL HIGH (ref 0.44–1.00)
GFR, Estimated: 40 mL/min — ABNORMAL LOW (ref >60)
Glucose, Bld: 203 mg/dL — ABNORMAL HIGH (ref 70–99)
Potassium: 3.8 mmol/L (ref 3.5–5.1)
Sodium: 138 mmol/L (ref 135–145)

## 2023-01-06 LAB — CBC
HCT: 36.9 % (ref 36.0–46.0)
Hemoglobin: 11.7 g/dL — ABNORMAL LOW (ref 12.0–15.0)
MCH: 30.2 pg (ref 26.0–34.0)
MCHC: 31.7 g/dL (ref 30.0–36.0)
MCV: 95.3 fL (ref 80.0–100.0)
Platelets: 179 10*3/uL (ref 150–400)
RBC: 3.87 MIL/uL (ref 3.87–5.11)
RDW: 13.9 % (ref 11.5–15.5)
WBC: 8.4 10*3/uL (ref 4.0–10.5)
nRBC: 0 % (ref 0.0–0.2)

## 2023-01-06 LAB — GLUCOSE, CAPILLARY: Glucose-Capillary: 183 mg/dL — ABNORMAL HIGH (ref 70–99)

## 2023-01-06 NOTE — Progress Notes (Signed)
PCP - Dr. Doreen Beam Cardiologist - denies  PPM/ICD - denies   Chest x-ray - 07/28/07 EKG - 01/06/23 Stress Test - denies ECHO - denies Cardiac Cath - denies  Sleep Study - denies   Fasting Blood Sugar - 70-130 Checks Blood Sugar twice a day  Last dose of GLP1 agonist-  5/26 GLP1 instructions: Hold 7 days  ASA/Blood Thinner Instructions: n/a   ERAS Protcol - yes PRE-SURGERY G2- given at PAT  COVID TEST- n/a   Anesthesia review: no  Patient denies shortness of breath, fever, cough and chest pain at PAT appointment   All instructions explained to the patient, with a verbal understanding of the material. Patient agrees to go over the instructions while at home for a better understanding. The opportunity to ask questions was provided.

## 2023-01-07 LAB — URINE CULTURE: Culture: <10000 — AB

## 2023-01-07 LAB — HEMOGLOBIN A1C
Hgb A1c MFr Bld: 8 % — ABNORMAL HIGH (ref 4.8–5.6)
Mean Plasma Glucose: 183 mg/dL

## 2023-01-12 ENCOUNTER — Ambulatory Visit (HOSPITAL_COMMUNITY): Payer: Medicare Other | Admitting: Anesthesiology

## 2023-01-12 ENCOUNTER — Telehealth: Payer: Self-pay | Admitting: Surgical

## 2023-01-12 NOTE — Anesthesia Preprocedure Evaluation (Signed)
Anesthesia Evaluation  Patient identified by MRN, date of birth, ID band Patient awake    Reviewed: Allergy & Precautions, H&P , NPO status , Patient's Chart, lab work & pertinent test results  Airway Mallampati: II  TM Distance: >3 FB Neck ROM: Full    Dental no notable dental hx. (+) Upper Dentures, Lower Dentures, Dental Advisory Given   Pulmonary neg pulmonary ROS, former smoker   Pulmonary exam normal breath sounds clear to auscultation       Cardiovascular Exercise Tolerance: Good hypertension, Pt. on medications and Pt. on home beta blockers + Peripheral Vascular Disease   Rhythm:Regular Rate:Normal     Neuro/Psych  Headaches  Anxiety Depression       GI/Hepatic Neg liver ROS,GERD  Medicated,,  Endo/Other  diabetes, Type 2, Oral Hypoglycemic Agents, Insulin Dependent    Renal/GU negative Renal ROS  negative genitourinary   Musculoskeletal  (+) Arthritis , Osteoarthritis,    Abdominal   Peds  Hematology  (+) Blood dyscrasia, anemia   Anesthesia Other Findings   Reproductive/Obstetrics negative OB ROS                             Anesthesia Physical Anesthesia Plan  ASA: 3  Anesthesia Plan: General   Post-op Pain Management: Regional block* and Tylenol PO (pre-op)*   Induction: Intravenous  PONV Risk Score and Plan: 4 or greater and Ondansetron, Dexamethasone and Midazolam  Airway Management Planned: Oral ETT  Additional Equipment:   Intra-op Plan:   Post-operative Plan: Extubation in OR  Informed Consent: I have reviewed the patients History and Physical, chart, labs and discussed the procedure including the risks, benefits and alternatives for the proposed anesthesia with the patient or authorized representative who has indicated his/her understanding and acceptance.     Dental advisory given  Plan Discussed with: CRNA  Anesthesia Plan Comments:         Anesthesia Quick Evaluation

## 2023-01-12 NOTE — Telephone Encounter (Signed)
Called patient to discuss her hemoglobin A1c going into surgery tomorrow.  Her A1c is 8.0 and told her that A1c above 7.8 or above 8.0 is generally associated with increased risk of postoperative infection and need for revision surgery in the future.  She understands but she would like to continue with plan for surgery tomorrow as she has made a lot of plans and had a lot of people move there schedules around to help her out postoperatively.  She understands the increased risk of infection but she would like to proceed.  Will see her tomorrow.

## 2023-01-13 ENCOUNTER — Other Ambulatory Visit: Payer: Self-pay

## 2023-01-13 ENCOUNTER — Ambulatory Visit (HOSPITAL_COMMUNITY)
Admission: RE | Admit: 2023-01-13 | Discharge: 2023-01-13 | Disposition: A | Discharge status: Home / Self Care | Payer: Medicare Other | Attending: Orthopedic Surgery | Admitting: Orthopedic Surgery

## 2023-01-13 ENCOUNTER — Encounter (HOSPITAL_COMMUNITY)
Admission: RE | Disposition: A | Discharge status: Home / Self Care | Payer: Self-pay | Source: Home / Self Care | Attending: Orthopedic Surgery

## 2023-01-13 ENCOUNTER — Encounter (HOSPITAL_COMMUNITY): Payer: Self-pay | Admitting: Orthopedic Surgery

## 2023-01-13 DIAGNOSIS — M25511 Pain in right shoulder: Secondary | ICD-10-CM | POA: Diagnosis present

## 2023-01-13 DIAGNOSIS — Z01818 Encounter for other preprocedural examination: Secondary | ICD-10-CM

## 2023-01-13 DIAGNOSIS — Z539 Procedure and treatment not carried out, unspecified reason: Secondary | ICD-10-CM | POA: Diagnosis not present

## 2023-01-13 DIAGNOSIS — E119 Type 2 diabetes mellitus without complications: Secondary | ICD-10-CM

## 2023-01-13 LAB — SURGICAL PCR SCREEN
MRSA, PCR: NEGATIVE
Staphylococcus aureus: POSITIVE — AB

## 2023-01-13 LAB — GLUCOSE, CAPILLARY: Glucose-Capillary: 176 mg/dL — ABNORMAL HIGH (ref 70–99)

## 2023-01-13 SURGERY — ARTHROPLASTY, SHOULDER, TOTAL, REVERSE
Anesthesia: General | Site: Shoulder | Laterality: Right

## 2023-01-13 MED ORDER — ORAL CARE MOUTH RINSE: 15.0000 mL | Freq: Once | OROMUCOSAL | Status: AC

## 2023-01-13 MED ORDER — CEFAZOLIN SODIUM-DEXTROSE 2-4 GM/100ML-% IV SOLN
2.0000 g | INTRAVENOUS | Status: DC
  Filled 2023-01-13: qty 100

## 2023-01-13 MED ORDER — BUPIVACAINE LIPOSOME 1.3 % IJ SUSP
INTRAMUSCULAR | Status: AC
  Filled 2023-01-13: qty 10

## 2023-01-13 MED ORDER — TRANEXAMIC ACID-NACL 1000-0.7 MG/100ML-% IV SOLN
1000.0000 mg | INTRAVENOUS | Status: DC
  Filled 2023-01-13: qty 100

## 2023-01-13 MED ORDER — FENTANYL CITRATE (PF) 250 MCG/5ML IJ SOLN
INTRAMUSCULAR | Status: AC
  Filled 2023-01-13: qty 5

## 2023-01-13 MED ORDER — POVIDONE-IODINE 10 % EX SWAB
2.0000 | Freq: Once | CUTANEOUS | Status: AC
  Administered 2023-01-13: 2 via TOPICAL

## 2023-01-13 MED ORDER — POVIDONE-IODINE 7.5 % EX SOLN
Freq: Once | CUTANEOUS | Status: DC
  Filled 2023-01-13: qty 118

## 2023-01-13 MED ORDER — VANCOMYCIN HCL 1000 MG IV SOLR
INTRAVENOUS | Status: AC
  Filled 2023-01-13: qty 20

## 2023-01-13 MED ORDER — LACTATED RINGERS IV SOLN: INTRAVENOUS | Status: DC

## 2023-01-13 MED ORDER — ACETAMINOPHEN 500 MG PO TABS
1000.0000 mg | ORAL_TABLET | Freq: Once | ORAL | Status: AC
  Administered 2023-01-13: 1000 mg via ORAL
  Filled 2023-01-13: qty 2

## 2023-01-13 MED ORDER — MIDAZOLAM HCL 2 MG/2ML IJ SOLN
INTRAMUSCULAR | Status: AC
  Filled 2023-01-13: qty 2

## 2023-01-13 MED ORDER — CHLORHEXIDINE GLUCONATE 0.12 % MT SOLN
15.0000 mL | Freq: Once | OROMUCOSAL | Status: AC
  Administered 2023-01-13: 15 mL via OROMUCOSAL
  Filled 2023-01-13: qty 15

## 2023-01-13 MED ORDER — PROPOFOL 10 MG/ML IV BOLUS
INTRAVENOUS | Status: AC
  Filled 2023-01-13: qty 20

## 2023-01-13 NOTE — Progress Notes (Signed)
Received a call from Beverly Sessions., PA with Dr. August Saucer to let anesthesia know to hold off on proceeding with nerve block until Dr. August Saucer comes to assess the patient. Anesthesiologist made aware.

## 2023-01-13 NOTE — H&P (Addendum)
Meghan [name] is an 67 y.o. female.   Chief Complaint: right shoulder pain HPI:  Meghan [name] is a 67 y.o. female who presents  reporting bilateral shoulder pain right worse than left.  She describes significant decrease in range of motion.  She is right-hand dominant.  She has been going to a chiropractor for sciatic pain.  Has a history of left clavicle bone infection years ago which was around the sternoclavicular joint which has resolved.  Takes Mobic and prednisone with minimal relief.  She has diabetes but her last A1c was approximately 58.  67 year old person stays with her at home.  No cardiac issues.  She does have difficulties with ADLs..    Past Medical History:  Diagnosis Date   Anemia 2020   iron infusions   Anxiety    Arthritis    Cellulitis 06/30/2012   RLE   Depression    Diabetic peripheral neuropathy (HCC)    GERD (gastroesophageal reflux disease)    Headache    High cholesterol    Hypertension    Seasonal allergies    Type II diabetes mellitus (HCC)    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    Past Surgical History:  Procedure Laterality Date   AMPUTATION  ~ 2008   "2nd toe of right foot" (06/30/2012)   AMPUTATION  07/04/2012   Procedure: AMPUTATION RAY;  Surgeon: Nadara Mustard, MD;  Location: [facility] OR;  Service: Orthopedics;  Laterality: Right;  great toe  amp vs ray amp   AMPUTATION Left 02/13/2021   Procedure: LEFT FOOT 3RD AND 4TH RAY AMPUTATION;  Surgeon: Nadara Mustard, MD;  Location: [facility] OR;  Service: Orthopedics;  Laterality: Left;   INCISION AND DRAINAGE OF WOUND  03/10/2007   "mediastinitis", left medial collarbone (06/30/2012)   LYMPHADENECTOMY  08/09/2005   "bilateral groins; had cancer vulva" (06/30/2012)   TUBAL LIGATION     VULVA SURGERY  08/09/2005   "took cancer off" (06/30/2012)   WISDOM TOOTH EXTRACTION      Family History  Adopted: Yes   Social History:  reports that she quit smoking about 24 years ago. Her smoking use included  cigarettes. She smoked an average of 1 pack per day. She has never used smokeless tobacco. She reports that she does not currently use alcohol. She reports that she does not use drugs.  Allergies: No Known Allergies  Medications Prior to Admission  Medication Sig Dispense Refill   Calcium Carb-Cholecalciferol (CALTRATE BONE HEALTH PO) Take 2 tablets by mouth 2 (two) times daily.     cetirizine (ZYRTEC) 10 MG tablet Take 10 mg by mouth in the morning.     Dulaglutide (TRULICITY) 0.75 MG/0.5ML SOPN Inject 0.75 mg into the skin every Sunday.     FLUoxetine (PROZAC) 40 MG capsule Take 40 mg by mouth in the morning.     insulin glargine (LANTUS) 100 UNIT/ML injection Inject 75 Units into the skin 2 (two) times daily.     insulin lispro (HUMALOG) 100 UNIT/ML injection Inject 7-10 Units into the skin 3 (three) times daily as needed for high blood sugar.     Krill Oil 350 MG CAPS Take 350 mg by mouth daily.     Lysine 1000 MG TABS Take 1,000 mg by mouth 2 (two) times daily.     meloxicam (MOBIC) 7.5 MG tablet Take 7.5 mg by mouth daily as needed for pain.     Menthol, Topical Analgesic, (BLUE-EMU MAXIMUM STRENGTH EX) Apply 1 Application topically at  bedtime.     metoprolol (LOPRESSOR) 50 MG tablet Take 50 mg by mouth in the morning.     Multiple Vitamin (MULTIVITAMIN WITH MINERALS) TABS tablet Take 2 tablets by mouth in the morning. One-A-Day Women's Multivitamin 50+     Multiple Vitamins-Minerals (HAIR SKIN AND NAILS FORMULA) TABS Take 2 tablets by mouth daily.     naproxen sodium (ALEVE) 220 MG tablet Take 220 mg by mouth at bedtime as needed (pain).     pantoprazole (PROTONIX) 40 MG tablet Take 40 mg by mouth in the morning.     pregabalin (LYRICA) 50 MG capsule Take 50 mg by mouth 3 (three) times daily.     rosuvastatin (CRESTOR) 40 MG tablet Take 40 mg by mouth every evening.     predniSONE (DELTASONE) 10 MG tablet Take 1 tablet (10 mg total) by mouth daily with breakfast. (Patient not taking:  Reported on 01/04/2023) 30 tablet 0    Results for orders placed or performed during the hospital encounter of 01/13/23 (from the past 48 hour(s))  Glucose, capillary     Status: Abnormal   Collection Time: 01/13/23  5:57 AM  Result Value Ref Range   Glucose-Capillary 176 (H) 70 - 99 mg/dL    Comment: Glucose reference range applies only to samples taken after fasting for at least 8 hours.   No results found.  Review of Systems  Musculoskeletal:  Positive for arthralgias.  All other systems reviewed and are negative.   Blood pressure (!) 137/53, pulse 64, temperature 98.5 F (36.9 C), temperature source Oral, resp. rate 16, height 5\' 7"  (1.702 m), weight 88.5 kg, SpO2 100 %. Physical Exam Vitals reviewed.  HENT:     Head: Normocephalic.     Nose: Nose normal.     Mouth/Throat:     Mouth: Mucous membranes are moist.  Eyes:     Pupils: Pupils are equal, round, and reactive to light.  Cardiovascular:     Rate and Rhythm: Normal rate.     Pulses: Normal pulses.  Pulmonary:     Effort: Pulmonary effort is normal.  Abdominal:     General: Abdomen is flat.  Musculoskeletal:     Cervical back: Normal range of motion.  Skin:    General: Skin is warm.     Capillary Refill: Capillary refill takes less than 2 seconds.  Neurological:     General: No focal deficit present.     Mental Status: She is alert.  Psychiatric:        Mood and Affect: Mood normal.    Ortho exam demonstrates forward flexion abduction about 40 degrees on the right-hand side. Deltoid fires. Motor or sensory function to the hand is intact but she does report episodic paresthesias. Negative Tinel's on the right elbow at the ulnar nerve. Radial pulses intact. Passive range of motion is about 90 degrees of abduction and forward flexion. Actively it is about half that. She does have tenderness around the shoulder joint as well as in the bicipital groove with no Popeye deformity.   Further assessment of the right arm  demonstrates skin tearing on the operative side.  No induration or fluctuance but there is some skin breakdown which is distal to the operative site.  This could be problematic.  Assessment/Plan  Impression is severe rotator cuff arthropathy in both shoulders right worse than left.  She has significant limitation of forward flexion and abduction.  Deltoid does fire.  Preop thin cut CT scan demonstrates minimal retroversion  of the glenoid vault at 4 degrees as well as fairly minimal superior glenoid wear with relatively neutral glenoid inclination.  Reverse shoulder replacement officer the best chance for pain relief and some degree of function above shoulder level..  The risk and benefits of reverse shoulder replacement are discussed with the patient include not limited to infection or vessel damage incomplete pain restoration as well as incomplete functional restoration.  Overall I think it is likely compared to where she is now she should be able to get her hand up overhead and back behind her head.  Patient understands risk and benefits.  All questions answered.  Anticipate 2 night hospital stay for Olegario Messier due to lack of family support.  Hemoglobin A1c is 8.  Patient is extensively counseled as to the need for optimal blood glucose control after surgery to decrease the risk of infection.  No personal or family history of DVT or pulmonary embolism. The skin breakdown on the right arm has not fully healed and thus for elective surgery would likely be best to wait until it is fully healed before placing an implant in that proximal shoulder region.  [name], MD 01/13/2023, 6:33 AM

## 2023-01-13 NOTE — Progress Notes (Signed)
Patient's CBG was 176 upon arrival to Short Stay. Patient took 7 units of Humalog and 40 units of Lantus at 0500. Per the Peri op Glycemic protocol notify anesthesiologist before proceeding. Dr. Aleene Davidson made aware and per Dr. Sampson Goon hold off on proceeding with the protocol.

## 2023-01-13 NOTE — Progress Notes (Signed)
Meghan [name], [place] with Dr. August Saucer made aware of patient's urinalysis and urine culture results from 01/06/23. Ok per Meghan [name]. Meghan [name] also made aware that patient has an open skin tear on her right lower arm.

## 2023-01-13 NOTE — Progress Notes (Signed)
Dr. August Saucer in to assess patient. Per Dr. August Saucer surgery is canceled due to open skin tear on right lower arm. Dr. August Saucer discussed rescheduling surgery with the patient and she verbalized understanding. 18 gauge PIV that was placed by V. Moses, CRNA removed from right hand. Patient escorted to the lobby via wheelchair. Patient denies any complaints.

## 2023-01-28 ENCOUNTER — Encounter: Payer: Medicare Other | Admitting: Orthopedic Surgery

## 2023-01-28 ENCOUNTER — Encounter: Payer: Medicare Other | Admitting: Surgical

## 2023-02-23 ENCOUNTER — Telehealth: Payer: Self-pay | Admitting: Orthopedic Surgery

## 2023-02-23 NOTE — Telephone Encounter (Signed)
Patient called to cancel right reverse shoulder surgery with Dr. August Saucer scheduled 03-10-23.  She states her preference is to have the numbness in her hands and fingers taken care of before having shoulder surgery.

## 2023-02-25 NOTE — Telephone Encounter (Signed)
She should come in and we can evaluate.  Sounds like it is either coming from her neck or atypically bilateral carpal tunnel or some other type of distal nerve compression.  Sounds most likely like it is coming from her neck.  Could have her see either me or Franky Macho or possibly make more if he has any time on his schedule.  Thanks

## 2023-02-25 NOTE — Telephone Encounter (Signed)
IC to confirm she had an appt with someone to address her concerns. She said she has an appointment with neurology. Offered for her to see someone in our office. She said she woke one morning in march with acute onset of numbness in both hands. She would like to know if we could do something for her here? She cannot write, she is dropping things, etc. Please advise.

## 2023-02-25 NOTE — Telephone Encounter (Signed)
Hold for [facility]

## 2023-02-28 NOTE — Telephone Encounter (Signed)
IC advised had her scheduled with Franky Macho on 07/31

## 2023-03-09 ENCOUNTER — Ambulatory Visit: Payer: Medicare Other | Admitting: Surgical

## 2023-03-09 DIAGNOSIS — R200 Anesthesia of skin: Secondary | ICD-10-CM | POA: Diagnosis not present

## 2023-03-10 ENCOUNTER — Ambulatory Visit: Admission: RE | Admit: 2023-03-10 | Payer: Medicare Other | Source: Home / Self Care | Admitting: Orthopedic Surgery

## 2023-03-10 ENCOUNTER — Encounter: Admission: RE | Payer: Self-pay | Source: Home / Self Care

## 2023-03-10 ENCOUNTER — Encounter: Payer: Self-pay | Admitting: Surgical

## 2023-03-10 DIAGNOSIS — Z01818 Encounter for other preprocedural examination: Secondary | ICD-10-CM

## 2023-03-10 SURGERY — REVERSE SHOULDER ARTHROPLASTY
Anesthesia: General | Site: Shoulder | Laterality: Right

## 2023-03-10 NOTE — Progress Notes (Signed)
Office Visit Note   Patient: Meghan [name]           Date of Birth: 30-Sep-1955           MRN: [id number] Visit Date: 03/09/2023 Requested by: Ignatius Specking, MD [address] [place],  [place] [address] PCP: Ignatius Specking, MD  Subjective: Chief Complaint  Patient presents with   Other    Bilateral hand numbness    HPI: Meghan [name] is a 67 y.o. female who presents to the office reporting bilateral hand numbness and tingling.  Describes palmar and dorsal numbness and tingling bilateral hands since March.  Really stays in the hands from the wrist down but will occasionally involve the distal forearm bilaterally.  She denies any history of injury or event bringing this on.  Has occasional neck pain but denies any radicular pain or scapular pain.  She has no pain traveling down the arm.  No history of prior hand or elbow surgery.  No headaches or vision changes.  Takes 15 mg Mobic, glucosamine, course of steroids back in March 2024 without relief.  A1c is 8.0.  She was scheduled for reverse shoulder replacement with Dr. August Saucer but she decided to cancel this until she can figure out what is going on with her hands.  She is dropping a lot of objects as she really cannot feel how much pressure she is applying with her grip..                ROS: All systems reviewed are negative as they relate to the chief complaint within the history of present illness.  Patient denies fevers or chills.  Assessment & Plan: Visit Diagnoses:  1. Bilateral hand numbness     Plan: Patient is a 67 year old female who presents for evaluation of bilateral hand numbness and tingling.  Has had this ongoing since March and states that involves the entire hand bilaterally.  She does have some neck discomfort but this is not any worse than normal and she has no radicular pain.  With ongoing symptoms, plan for further evaluation with nerve conduction study and if this is unremarkable, next step would be evaluation of  referred pain from the cervical spine with MRI C-spine.    Follow-Up Instructions: No follow-ups on file.   Orders:  Orders Placed This Encounter  Procedures   B12 and Folate Panel   Ambulatory referral to Physical Medicine Rehab   No orders of the defined types were placed in this encounter.     Procedures: No procedures performed   Clinical Data: No additional findings.  Objective: Vital Signs: There were no vitals taken for this visit.  Physical Exam:  Constitutional: Patient appears well-developed HEENT:  Head: Normocephalic Eyes:EOM are normal Neck: Normal range of motion Cardiovascular: Normal rate Pulmonary/chest: Effort normal Neurologic: Patient is alert Skin: Skin is warm Psychiatric: Patient has normal mood and affect  Ortho Exam: Ortho exam demonstrates atrophy in the thenar muscle group bilaterally mildly.  She has intact EPL, FPL, finger abduction, wrist extension, grip strength.  Decree sensation throughout the entirety of the hands.  Negative Durkan sign.  Negative Tinel's sign bilaterally.  Negative Tinel at the elbow.  No subluxing ulnar nerve bilaterally.  Negative elbow flexion test bilaterally.  Negative Spurling sign.  Negative Lhermitte sign.  Specialty Comments:  No specialty comments available.  Imaging: No results found.   PMFS History: Patient Active Problem List   Diagnosis Date Noted   Osteomyelitis of  third toe of left foot (HCC)    History of gout 06/30/2012   DM (diabetes mellitus), type 2, uncontrolled, periph vascular complic 06/30/2012   HTN (hypertension) 06/30/2012   Cellulitis of leg 06/30/2012   ARF (acute renal failure) (HCC) 06/30/2012   Past Medical History:  Diagnosis Date   Anemia 2020   iron infusions   Anxiety    Arthritis    Cellulitis 06/30/2012   RLE   Depression    Diabetic peripheral neuropathy (HCC)    GERD (gastroesophageal reflux disease)    Headache    High cholesterol    Hypertension     Seasonal allergies    Type II diabetes mellitus (HCC)    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    Family History  Adopted: Yes    Past Surgical History:  Procedure Laterality Date   AMPUTATION  ~ 2008   "2nd toe of right foot" (06/30/2012)   AMPUTATION  07/04/2012   Procedure: AMPUTATION RAY;  Surgeon: Nadara Mustard, MD;  Location: [facility] OR;  Service: Orthopedics;  Laterality: Right;  great toe  amp vs ray amp   AMPUTATION Left 02/13/2021   Procedure: LEFT FOOT 3RD AND 4TH RAY AMPUTATION;  Surgeon: Nadara Mustard, MD;  Location: [facility] OR;  Service: Orthopedics;  Laterality: Left;   INCISION AND DRAINAGE OF WOUND  03/10/2007   "mediastinitis", left medial collarbone (06/30/2012)   LYMPHADENECTOMY  08/09/2005   "bilateral groins; had cancer vulva" (06/30/2012)   TUBAL LIGATION     VULVA SURGERY  08/09/2005   "took cancer off" (06/30/2012)   WISDOM TOOTH EXTRACTION     Social History   Occupational History   Not on file  Tobacco Use   Smoking status: Former    Current packs/day: 0.00    Types: Cigarettes    Quit date: 11/1998    Years since quitting: 24.3   Smokeless tobacco: Never   Tobacco comments:    06/30/2012 "quit cigarette smoking in 1999"  Vaping Use   Vaping status: Never Used  Substance and Sexual Activity   Alcohol use: Not Currently   Drug use: No   Sexual activity: Not Currently    Birth control/protection: Post-menopausal

## 2023-03-15 ENCOUNTER — Ambulatory Visit (INDEPENDENT_AMBULATORY_CARE_PROVIDER_SITE_OTHER): Payer: Medicare Other | Admitting: Physical Medicine and Rehabilitation

## 2023-03-15 DIAGNOSIS — R200 Anesthesia of skin: Secondary | ICD-10-CM

## 2023-03-15 DIAGNOSIS — R531 Weakness: Secondary | ICD-10-CM | POA: Diagnosis not present

## 2023-03-15 DIAGNOSIS — M25512 Pain in left shoulder: Secondary | ICD-10-CM

## 2023-03-15 DIAGNOSIS — R202 Paresthesia of skin: Secondary | ICD-10-CM

## 2023-03-15 DIAGNOSIS — G8929 Other chronic pain: Secondary | ICD-10-CM

## 2023-03-15 DIAGNOSIS — M25511 Pain in right shoulder: Secondary | ICD-10-CM | POA: Diagnosis not present

## 2023-03-15 NOTE — Progress Notes (Signed)
Functional Pain Scale - descriptive words and definitions  No Pain (0)   No Pain/Loss of function  Average Pain 0  Right handed. Bilateral hand numbness

## 2023-03-15 NOTE — Progress Notes (Signed)
Meghan [name] - 67 y.o. female MRN [id number]  Date of birth: November 07, 1955  Office Visit Note: Visit Date: 03/15/2023 PCP: Ignatius Specking, MD Referred by: Cammy Copa, MD  Subjective: Chief Complaint  Patient presents with   Right Hand - Numbness   Left Hand - Numbness   HPI:  Meghan [name] is a 67 y.o. female who comes in today at the request of Dr. [name] for evaluation and management of chronic, worsening and severe pain, numbness and tingling in the Bilateral upper extremities.  Patient is Right hand dominant.  She complains of severe worsening pain and numbness and tingling in the global distribution in all dermatomes bilaterally equally left and right.  Symptoms can refer up the arms and at the elbow the forearm area.  There was no specific injury.  She is continue to have symptoms despite anti-inflammatory use as well as course of oral steroids.  Her case is complicated by significant insulin-dependent diabetes.  She has had a history of toe amputation and vascular compromise as well as renal failure.  Hemoglobin A1c 2 months ago was 8.0.  Her case is also complicated by significant bilateral shoulder arthritis.  She was scheduled to have replacement surgery but this has been canceled a couple times.  She does not really endorse pain shooting down the arms or specific neck pain although she does have back pain at times.  She has noted weakness and difficulty manipulating small objects.    I spent more than 30 minutes speaking face-to-face with the patient with 50% of the time in counseling and discussing coordination of care.    Review of Systems  Musculoskeletal:  Positive for back pain, joint pain and neck pain.  Neurological:  Positive for tingling and weakness.  All other systems reviewed and are negative.  Otherwise per HPI.  Assessment & Plan: Visit Diagnoses:    ICD-10-CM   1. Paresthesia of skin  R20.2 NCV with EMG (electromyography)     2. Weakness  R53.1     3. Chronic pain of both shoulders  M25.511    G89.29    M25.512     4. Bilateral hand numbness  R20.0       Plan: Impression: Clinical complaints consistent with a combination of likely median neuropathy at the wrist and peripheral polyneuropathy given the history of diabetic complications.  Electrodiagnostic study performed.  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   **There is electrodiagnostic evidence highly suggestive of underlying peripheral polyneuropathy consistent with diabetic neuropathy.  There is no significant electrodiagnostic evidence of any other focal nerve entrapment, brachial plexopathy or cervical radiculopathy.  Recommendations: 1.  Follow-up with referring physician. 2.  Continue current management of symptoms. 3.  Suggest surgical evaluation.  Careful clinical correlation with symptoms.  Meds & Orders: No orders of the defined types were placed in this encounter.   Orders Placed This Encounter  Procedures   NCV with EMG (electromyography)    Follow-up: Return for  G. Dorene Grebe, MD.   Procedures: No procedures performed  EMG & NCV Findings: Evaluation of the left median motor and the right median motor nerves showed prolonged distal onset latency (L8.6, R5.9 ms), reduced amplitude (L4.0, R0.3 mV), and decreased conduction velocity (Elbow-Wrist, L35, R47 m/s).  The left ulnar motor nerve showed decreased conduction velocity (B Elbow-Wrist, 50 m/s) and  decreased conduction velocity (A Elbow-B Elbow, 34 m/s).  The right ulnar motor nerve showed decreased conduction velocity (B Elbow-Wrist, 44 m/s).  The left median (across palm) sensory and the right median (across palm) sensory nerves showed prolonged distal peak latency (Wrist, L10.2, R6.0 ms), reduced amplitude (L7.6, R0.3 V), and  prolonged distal peak latency (Palm, L4.1, R2.2 ms).  The left ulnar sensory and the right ulnar sensory nerves showed prolonged distal peak latency (L4.7, R4.4 ms), reduced amplitude (L10.4, R10.7 V), and decreased conduction velocity (Wrist-5th Digit, L30, R32 m/s).  All remaining nerves (as indicated in the following tables) were within normal limits.  Left vs. Right side comparison data for the median motor nerve indicates abnormal L-R latency difference (2.7 ms), abnormal L-R amplitude difference (92.5 %), and abnormal L-R velocity difference (Elbow-Wrist, 12 m/s).  The ulnar motor nerve indicates abnormal L-R amplitude difference (30.1 %) and abnormal L-R velocity difference (A Elbow-B Elbow, 19 m/s).  All remaining left vs. right side differences were within normal limits.    All examined muscles (as indicated in the following table) showed no evidence of electrical instability.    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   **There is electrodiagnostic evidence highly suggestive of underlying peripheral polyneuropathy consistent with diabetic neuropathy.  There is no significant electrodiagnostic evidence of any other focal nerve entrapment, brachial plexopathy or cervical radiculopathy.  Recommendations: 1.  Follow-up with referring physician. 2.  Continue current management of symptoms. 3.  Suggest surgical evaluation.  Careful clinical correlation with symptoms.  ___________________________ Naaman Plummer FAAPMR Board Certified, American Board of Physical Medicine and Rehabilitation    Nerve Conduction Studies Anti Sensory Summary Table   Stim Site NR Peak (ms) Norm Peak (ms) P-T Amp (V) Norm P-T Amp Site1 Site2 Delta-P (ms) Dist (cm) Vel (m/s) Norm Vel (m/s)  Left Median Acr Palm Anti Sensory (2nd Digit)  28.9C  Wrist     *10.2 <3.6 *7.6 >10 Wrist Palm 6.1 0.0    Palm    *4.1 <2.0 0.2         Right Median Acr Palm Anti Sensory (2nd Digit)  29.1C  Wrist    *6.0 <3.6 *0.3 >10 Wrist Palm 3.8 0.0    Palm    *2.2 <2.0 5.1         Left Radial Anti Sensory (Base 1st Digit)  28.8C  Wrist    2.7 <3.1 11.3  Wrist Base 1st Digit 2.7 0.0    Right Radial Anti Sensory (Base 1st Digit)  28.6C  Wrist    3.0 <3.1 10.3  Wrist Base 1st Digit 3.0 0.0    Left Ulnar Anti Sensory (5th Digit)  29.6C  Wrist    *4.7 <3.7 *10.4 >15.0 Wrist 5th Digit 4.7 14.0 *30 >38  Right Ulnar Anti Sensory (5th Digit)  29.1C  Wrist    *4.4 <3.7 *10.7 >15.0 Wrist 5th Digit 4.4 14.0 *32 >38   Motor Summary Table   Stim Site NR Onset (ms) Norm Onset (ms) O-P Amp (mV) Norm O-P Amp Site1 Site2 Delta-0 (ms) Dist (cm) Vel (m/s) Norm Vel (m/s)  Left Median Motor (Abd Poll Brev)  29.3C  Wrist    *8.6 <4.2 *4.0 >5 Elbow Wrist 5.7 20.0 *35 >50  Elbow    14.3  2.6         Right Median Motor (Abd Poll Brev)  29.1C  Wrist    *5.9 <4.2 *0.3 >5 Elbow Wrist 4.3 20.0 *47 >50  Elbow    10.2  4.0         Left Ulnar Motor (Abd Dig Min)  29.5C  Wrist    4.2 <4.2 5.8 >3 B Elbow Wrist 4.2 21.0 *50 >53  B Elbow    8.4  5.7  A Elbow B Elbow 2.9 10.0 *34 >53  A Elbow    11.3  2.3         Right Ulnar Motor (Abd Dig Min)  28.6C  Wrist    4.0 <4.2 8.3 >3 B Elbow Wrist 4.5 20.0 *44 >53  B Elbow    8.5  5.4  A Elbow B Elbow 1.9 10.0 53 >53  A Elbow    10.4  5.4          EMG   Side Muscle Nerve Root Ins Act Fibs Psw Amp Dur Poly Recrt Int Dennie Bible Comment  Right Abd Poll Brev Median C8-T1 Nml Nml Nml Nml Nml 0 Nml Nml   Right 1stDorInt Ulnar C8-T1 Nml Nml Nml Nml Nml 0 Nml Nml   Right PronatorTeres Median C6-7 Nml Nml Nml Nml Nml 0 Nml Nml   Right Biceps Musculocut C5-6 Nml Nml Nml Nml Nml 0 Nml Nml   Right Deltoid Axillary C5-6 Nml Nml Nml Nml Nml 0 Nml Nml     Nerve Conduction Studies Anti Sensory Left/Right Comparison   Stim Site L Lat (ms) R Lat (ms) L-R  Lat (ms) L Amp (V) R Amp (V) L-R Amp (%) Site1 Site2 L Vel (m/s) R Vel (m/s) L-R Vel (m/s)  Median Acr Palm Anti Sensory (2nd Digit)  28.9C  Wrist *10.2 *6.0 4.2 *7.6 *0.3 96.1 Wrist Palm     Palm *4.1 *2.2 1.9 0.2 5.1 96.1       Radial Anti Sensory (Base 1st Digit)  28.8C  Wrist 2.7 3.0 0.3 11.3 10.3 8.8 Wrist Base 1st Digit     Ulnar Anti Sensory (5th Digit)  29.6C  Wrist *4.7 *4.4 0.3 *10.4 *10.7 2.8 Wrist 5th Digit *30 *32 2   Motor Left/Right Comparison   Stim Site L Lat (ms) R Lat (ms) L-R Lat (ms) L Amp (mV) R Amp (mV) L-R Amp (%) Site1 Site2 L Vel (m/s) R Vel (m/s) L-R Vel (m/s)  Median Motor (Abd Poll Brev)  29.3C  Wrist *8.6 *5.9 *2.7 *4.0 *0.3 *92.5 Elbow Wrist *35 *47 *12  Elbow 14.3 10.2 4.1 2.6 4.0 35.0       Ulnar Motor (Abd Dig Min)  29.5C  Wrist 4.2 4.0 0.2 5.8 8.3 *30.1 B Elbow Wrist *50 *44 6  B Elbow 8.4 8.5 0.1 5.7 5.4 5.3 A Elbow B Elbow *34 53 *19  A Elbow 11.3 10.4 0.9 2.3 5.4 57.4          Waveforms:                      Clinical History: No specialty comments available.     Objective:  VS:  HT:    WT:   BMI:     BP:   HR: bpm  TEMP: ( )  RESP:  Physical Exam Vitals and nursing note reviewed.  Constitutional:      General: She is not in acute distress.    Appearance: Normal appearance. She is well-developed. She is not ill-appearing.  HENT:     Head: Normocephalic and atraumatic.  Eyes:     Conjunctiva/sclera: Conjunctivae normal.     Pupils: Pupils are equal, round, and reactive to light.  Cardiovascular:     Rate and Rhythm: Normal rate.     Pulses: Normal pulses.  Pulmonary:     Effort: Pulmonary effort is normal.  Musculoskeletal:        General: Tenderness present. No swelling or deformity.     Cervical back: Neck supple. Tenderness present.     Right lower leg: No edema.     Left lower leg: No edema.     Comments: Inspection reveals some atrophy of the right APB compared to the left but no atrophy of the bilateral  FDI or hand intrinsics. There is no swelling, color changes, allodynia or dystrophic changes. There is 5 out of 5 strength in the bilateral wrist extension, finger abduction and long finger flexion.  There is decreased sensation in the median nerve distribution on the right more than the left but somewhat global dysesthesia. There is a negative Tinel's test at the bilateral wrist and elbow. There is a positive Phalen's test bilaterally. There is a negative Hoffmann's test bilaterally.  Skin:    General: Skin is warm and dry.     Findings: No erythema or rash.  Neurological:     General: No focal deficit present.     Mental Status: She is alert and oriented to person, place, and time.     Cranial Nerves: No cranial nerve deficit.     Sensory: Sensory deficit present.     Motor: Weakness present. No abnormal muscle tone.     Coordination: Coordination normal.     Gait: Gait normal.  Psychiatric:        Mood and Affect: Mood normal.        Behavior: Behavior normal.      Imaging: No results found.

## 2023-03-16 NOTE — Procedures (Signed)
EMG & NCV Findings: Evaluation of the left median motor and the right median motor nerves showed prolonged distal onset latency (L8.6, R5.9 ms), reduced amplitude (L4.0, R0.3 mV), and decreased conduction velocity (Elbow-Wrist, L35, R47 m/s).  The left ulnar motor nerve showed decreased conduction velocity (B Elbow-Wrist, 50 m/s) and decreased conduction velocity (A Elbow-B Elbow, 34 m/s).  The right ulnar motor nerve showed decreased conduction velocity (B Elbow-Wrist, 44 m/s).  The left median (across palm) sensory and the right median (across palm) sensory nerves showed prolonged distal peak latency (Wrist, L10.2, R6.0 ms), reduced amplitude (L7.6, R0.3 V), and prolonged distal peak latency (Palm, L4.1, R2.2 ms).  The left ulnar sensory and the right ulnar sensory nerves showed prolonged distal peak latency (L4.7, R4.4 ms), reduced amplitude (L10.4, R10.7 V), and decreased conduction velocity (Wrist-5th Digit, L30, R32 m/s).  All remaining nerves (as indicated in the following tables) were within normal limits.  Left vs. Right side comparison data for the median motor nerve indicates abnormal L-R latency difference (2.7 ms), abnormal L-R amplitude difference (92.5 %), and abnormal L-R velocity difference (Elbow-Wrist, 12 m/s).  The ulnar motor nerve indicates abnormal L-R amplitude difference (30.1 %) and abnormal L-R velocity difference (A Elbow-B Elbow, 19 m/s).  All remaining left vs. right side differences were within normal limits.    All examined muscles (as indicated in the following table) showed no evidence of electrical instability.    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   **There is electrodiagnostic evidence highly suggestive of underlying peripheral polyneuropathy consistent with diabetic neuropathy.  There is no significant  electrodiagnostic evidence of any other focal nerve entrapment, brachial plexopathy or cervical radiculopathy.  Recommendations: 1.  Follow-up with referring physician. 2.  Continue current management of symptoms. 3.  Suggest surgical evaluation.  Careful clinical correlation with symptoms.  ___________________________ Naaman Plummer FAAPMR Board Certified, American Board of Physical Medicine and Rehabilitation    Nerve Conduction Studies Anti Sensory Summary Table   Stim Site NR Peak (ms) Norm Peak (ms) P-T Amp (V) Norm P-T Amp Site1 Site2 Delta-P (ms) Dist (cm) Vel (m/s) Norm Vel (m/s)  Left Median Acr Palm Anti Sensory (2nd Digit)  28.9C  Wrist    *10.2 <3.6 *7.6 >10 Wrist Palm 6.1 0.0    Palm    *4.1 <2.0 0.2         Right Median Acr Palm Anti Sensory (2nd Digit)  29.1C  Wrist    *6.0 <3.6 *0.3 >10 Wrist Palm 3.8 0.0    Palm    *2.2 <2.0 5.1         Left Radial Anti Sensory (Base 1st Digit)  28.8C  Wrist    2.7 <3.1 11.3  Wrist Base 1st Digit 2.7 0.0    Right Radial Anti Sensory (Base 1st Digit)  28.6C  Wrist    3.0 <3.1 10.3  Wrist Base 1st Digit 3.0 0.0    Left Ulnar Anti Sensory (5th Digit)  29.6C  Wrist    *4.7 <3.7 *10.4 >15.0 Wrist 5th Digit 4.7 14.0 *30 >38  Right Ulnar Anti Sensory (5th Digit)  29.1C  Wrist    *4.4 <3.7 *10.7 >15.0 Wrist 5th Digit 4.4 14.0 *32 >38   Motor Summary Table   Stim Site NR Onset (ms) Norm Onset (ms) O-P Amp (mV) Norm O-P Amp Site1 Site2 Delta-0 (ms) Dist (cm)  Vel (m/s) Norm Vel (m/s)  Left Median Motor (Abd Poll Brev)  29.3C  Wrist    *8.6 <4.2 *4.0 >5 Elbow Wrist 5.7 20.0 *35 >50  Elbow    14.3  2.6         Right Median Motor (Abd Poll Brev)  29.1C  Wrist    *5.9 <4.2 *0.3 >5 Elbow Wrist 4.3 20.0 *47 >50  Elbow    10.2  4.0         Left Ulnar Motor (Abd Dig Min)  29.5C  Wrist    4.2 <4.2 5.8 >3 B Elbow Wrist 4.2 21.0 *50 >53  B Elbow    8.4  5.7  A Elbow B Elbow 2.9 10.0 *34 >53  A Elbow    11.3  2.3         Right Ulnar  Motor (Abd Dig Min)  28.6C  Wrist    4.0 <4.2 8.3 >3 B Elbow Wrist 4.5 20.0 *44 >53  B Elbow    8.5  5.4  A Elbow B Elbow 1.9 10.0 53 >53  A Elbow    10.4  5.4          EMG   Side Muscle Nerve Root Ins Act Fibs Psw Amp Dur Poly Recrt Int Dennie Bible Comment  Right Abd Poll Brev Median C8-T1 Nml Nml Nml Nml Nml 0 Nml Nml   Right 1stDorInt Ulnar C8-T1 Nml Nml Nml Nml Nml 0 Nml Nml   Right PronatorTeres Median C6-7 Nml Nml Nml Nml Nml 0 Nml Nml   Right Biceps Musculocut C5-6 Nml Nml Nml Nml Nml 0 Nml Nml   Right Deltoid Axillary C5-6 Nml Nml Nml Nml Nml 0 Nml Nml     Nerve Conduction Studies Anti Sensory Left/Right Comparison   Stim Site L Lat (ms) R Lat (ms) L-R Lat (ms) L Amp (V) R Amp (V) L-R Amp (%) Site1 Site2 L Vel (m/s) R Vel (m/s) L-R Vel (m/s)  Median Acr Palm Anti Sensory (2nd Digit)  28.9C  Wrist *10.2 *6.0 4.2 *7.6 *0.3 96.1 Wrist Palm     Palm *4.1 *2.2 1.9 0.2 5.1 96.1       Radial Anti Sensory (Base 1st Digit)  28.8C  Wrist 2.7 3.0 0.3 11.3 10.3 8.8 Wrist Base 1st Digit     Ulnar Anti Sensory (5th Digit)  29.6C  Wrist *4.7 *4.4 0.3 *10.4 *10.7 2.8 Wrist 5th Digit *30 *32 2   Motor Left/Right Comparison   Stim Site L Lat (ms) R Lat (ms) L-R Lat (ms) L Amp (mV) R Amp (mV) L-R Amp (%) Site1 Site2 L Vel (m/s) R Vel (m/s) L-R Vel (m/s)  Median Motor (Abd Poll Brev)  29.3C  Wrist *8.6 *5.9 *2.7 *4.0 *0.3 *92.5 Elbow Wrist *35 *47 *12  Elbow 14.3 10.2 4.1 2.6 4.0 35.0       Ulnar Motor (Abd Dig Min)  29.5C  Wrist 4.2 4.0 0.2 5.8 8.3 *30.1 B Elbow Wrist *50 *44 6  B Elbow 8.4 8.5 0.1 5.7 5.4 5.3 A Elbow B Elbow *34 53 *19  A Elbow 11.3 10.4 0.9 2.3 5.4 57.4          Waveforms:

## 2023-03-18 ENCOUNTER — Encounter: Payer: Self-pay | Admitting: Physical Medicine and Rehabilitation

## 2023-03-25 ENCOUNTER — Ambulatory Visit: Payer: Medicare Other | Admitting: Orthopedic Surgery

## 2023-03-25 DIAGNOSIS — R200 Anesthesia of skin: Secondary | ICD-10-CM

## 2023-03-25 DIAGNOSIS — R202 Paresthesia of skin: Secondary | ICD-10-CM

## 2023-03-26 ENCOUNTER — Encounter: Payer: Self-pay | Admitting: Orthopedic Surgery

## 2023-03-26 NOTE — Progress Notes (Signed)
Office Visit Note   Patient: Meghan [name]           Date of Birth: 14-Feb-1956           MRN: [id number] Visit Date: 03/25/2023 Requested by: Ignatius Specking, MD [address] [place],  [place] [address] PCP: Ignatius Specking, MD  Subjective: Chief Complaint  Patient presents with   Other     Review EMG/NCV    HPI: Meghan [name] is a 67 y.o. female who presents to the office reporting bilateral hand pain numbness and tingling since March.  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  There is also some peripheral neuropathy consistent with diabetic neuropathy.  Patient's insulin-dependent diabetes is under reasonably good control..                ROS: All systems reviewed are negative as they relate to the chief complaint within the history of present illness.  Patient denies fevers or chills.  Assessment & Plan: Visit Diagnoses: No diagnosis found.  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  In general her best option would be carpal tunnel release.  She would like to get this fixed prior to her shoulder replacement.  Risk benefits are discussed with the patient including not limited to infection or vessel damage decreased grip strength for period of several weeks.  Patient understands risk and benefits and wishes to proceed.  All questions answered  Follow-Up Instructions: No follow-ups on file.   Orders:  No orders of the defined types were placed in this encounter.  No orders of the defined types were placed in this encounter.     Procedures: No procedures performed   Clinical Data: No additional findings.  Objective: Vital Signs: There were no vitals taken for this visit.  Physical Exam:  Constitutional: Patient appears well-developed HEENT:  Head: Normocephalic Eyes:EOM are normal Neck:  Normal range of motion Cardiovascular: Normal rate Pulmonary/chest: Effort normal Neurologic: Patient is alert Skin: Skin is warm Psychiatric: Patient has normal mood and affect  Ortho Exam: Ortho exam demonstrates intact EPL FPL interosseous strength with palpable radial pulse.  Abductor pollicis brevis does fire.  Slight thenar muscle wasting.  Strength is intact however.  Wrist range of motion intact.  Shoulder range of motion limited bilaterally worse on the left.  Decreased sensation in the median greater than ulnar nerve distribution on the right hand.  Negative Tinel's in the cubital tunnel.  Specialty Comments:  No specialty comments available.  Imaging: No results found.   PMFS History: Patient Active Problem List   Diagnosis Date Noted   Osteomyelitis of third toe of left foot (HCC)    History of gout 06/30/2012   DM (diabetes mellitus), type 2, uncontrolled, periph vascular complic 06/30/2012   HTN (hypertension) 06/30/2012   Cellulitis of leg 06/30/2012   ARF (acute renal failure) (HCC) 06/30/2012   Past Medical History:  Diagnosis Date   Anemia 2020   iron infusions   Anxiety    Arthritis    Cellulitis 06/30/2012   RLE   Depression    Diabetic peripheral neuropathy (HCC)    GERD (gastroesophageal reflux disease)    Headache    High cholesterol    Hypertension    Seasonal allergies    Type II diabetes mellitus (HCC)    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  Family History  Adopted: Yes    Past Surgical History:  Procedure Laterality Date   AMPUTATION  ~ 2008   "2nd toe of right foot" (06/30/2012)   AMPUTATION  07/04/2012   Procedure: AMPUTATION RAY;  Surgeon: Nadara Mustard, MD;  Location: [facility] OR;  Service: Orthopedics;  Laterality: Right;  great toe  amp vs ray amp   AMPUTATION Left 02/13/2021   Procedure: LEFT FOOT 3RD AND 4TH RAY AMPUTATION;  Surgeon: Nadara Mustard, MD;  Location: [facility];  Service: Orthopedics;  Laterality: Left;   INCISION AND DRAINAGE OF  WOUND  03/10/2007   "mediastinitis", left medial collarbone (06/30/2012)   LYMPHADENECTOMY  08/09/2005   "bilateral groins; had cancer vulva" (06/30/2012)   TUBAL LIGATION     VULVA SURGERY  08/09/2005   "took cancer off" (06/30/2012)   WISDOM TOOTH EXTRACTION     Social History   Occupational History   Not on file  Tobacco Use   Smoking status: Former    Current packs/day: 0.00    Types: Cigarettes    Quit date: 11/1998    Years since quitting: 24.3   Smokeless tobacco: Never   Tobacco comments:    06/30/2012 "quit cigarette smoking in 1999"  Vaping Use   Vaping status: Never Used  Substance and Sexual Activity   Alcohol use: Not Currently   Drug use: No   Sexual activity: Not Currently    Birth control/protection: Post-menopausal

## 2023-04-25 ENCOUNTER — Other Ambulatory Visit: Payer: Self-pay | Admitting: Surgical

## 2023-04-25 DIAGNOSIS — G5601 Carpal tunnel syndrome, right upper limb: Secondary | ICD-10-CM | POA: Diagnosis not present

## 2023-04-25 MED ORDER — TRAMADOL HCL 50 MG PO TABS: 50.0000 mg | ORAL_TABLET | Freq: Four times a day (QID) | ORAL | 0 refills | Status: DC | PRN

## 2023-04-26 ENCOUNTER — Telehealth: Payer: Self-pay

## 2023-04-26 NOTE — Telephone Encounter (Signed)
FYI. Advised this is normal. Advised to elevate above heart and ice fingers to help reduce swelling

## 2023-04-26 NOTE — Telephone Encounter (Signed)
Patient's son called stating that patient has some swelling in her right hand and fingers.  Stated that it has reduced some and is not discolored, but the swelling is there.  Wanted to know if this is to be expected?  Cb# [phone].  Patient had carpal tunnel surgery on 04/25/2023.  Please advise.

## 2023-04-26 NOTE — Telephone Encounter (Signed)
Thank you :)

## 2023-05-06 ENCOUNTER — Encounter: Payer: Medicare Other | Admitting: Surgical

## 2023-05-06 ENCOUNTER — Telehealth: Payer: Self-pay | Admitting: Surgical

## 2023-05-06 NOTE — Telephone Encounter (Signed)
Patient called advised she is sick and can not come to her post op appointment today. Patient asked if she can be work into [facility] or Dr. Alfonso Patten schedule? The number to contact patient is [phone]

## 2023-05-09 NOTE — Telephone Encounter (Signed)
scheduled

## 2023-05-12 ENCOUNTER — Encounter: Payer: Self-pay | Admitting: Orthopedic Surgery

## 2023-05-12 ENCOUNTER — Ambulatory Visit (INDEPENDENT_AMBULATORY_CARE_PROVIDER_SITE_OTHER): Payer: Medicare Other | Admitting: Orthopedic Surgery

## 2023-05-12 DIAGNOSIS — R202 Paresthesia of skin: Secondary | ICD-10-CM

## 2023-05-12 MED ORDER — DOXYCYCLINE HYCLATE 100 MG PO TABS: 100.0000 mg | ORAL_TABLET | Freq: Two times a day (BID) | ORAL | 0 refills | Status: DC

## 2023-05-12 NOTE — Progress Notes (Signed)
Post-Op Visit Note   Patient: Meghan [name]           Date of Birth: 21-Feb-1956           MRN: [id number] Visit Date: 05/12/2023 PCP: Ignatius Specking, MD   Assessment & Plan:  Chief Complaint:  Chief Complaint  Patient presents with   Right Wrist - Routine Post Op    RIGHT CTR (surgery date 04-25-23)   Visit Diagnoses:  1. Paresthesia of skin     Plan: Meghan [name] is a patient with right hand carpal tunnel syndrome.  Since she was last seen she was hospitalized for blood glucose over 900.  Overall her hand is doing well.  On examination she is having little bit of gapping of the incision.  I am going to put dry dressing on that hand and then put her on doxycycline.  He is to come back in a week for clinical recheck on the incision.  Okay to move her fingers but I do not want her lifting anything.  Follow-Up Instructions: No follow-ups on file.   Orders:  No orders of the defined types were placed in this encounter.  Meds ordered this encounter  Medications   doxycycline (VIBRA-TABS) 100 MG tablet    Sig: Take 1 tablet (100 mg total) by mouth 2 (two) times daily.    Dispense:  20 tablet    Refill:  0    Imaging: No results found.  PMFS History: Patient Active Problem List   Diagnosis Date Noted   Osteomyelitis of third toe of left foot (HCC)    History of gout 06/30/2012   DM (diabetes mellitus), type 2, uncontrolled, periph vascular complic 06/30/2012   HTN (hypertension) 06/30/2012   Cellulitis of leg 06/30/2012   ARF (acute renal failure) (HCC) 06/30/2012   Past Medical History:  Diagnosis Date   Anemia 2020   iron infusions   Anxiety    Arthritis    Cellulitis 06/30/2012   RLE   Depression    Diabetic peripheral neuropathy (HCC)    GERD (gastroesophageal reflux disease)    Headache    High cholesterol    Hypertension    Seasonal allergies    Type II diabetes mellitus (HCC)    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    Family History  Adopted: Yes     Past Surgical History:  Procedure Laterality Date   AMPUTATION  ~ 2008   "2nd toe of right foot" (06/30/2012)   AMPUTATION  07/04/2012   Procedure: AMPUTATION RAY;  Surgeon: Nadara Mustard, MD;  Location: [facility] OR;  Service: Orthopedics;  Laterality: Right;  great toe  amp vs ray amp   AMPUTATION Left 02/13/2021   Procedure: LEFT FOOT 3RD AND 4TH RAY AMPUTATION;  Surgeon: Nadara Mustard, MD;  Location: [facility] OR;  Service: Orthopedics;  Laterality: Left;   INCISION AND DRAINAGE OF WOUND  03/10/2007   "mediastinitis", left medial collarbone (06/30/2012)   LYMPHADENECTOMY  08/09/2005   "bilateral groins; had cancer vulva" (06/30/2012)   TUBAL LIGATION     VULVA SURGERY  08/09/2005   "took cancer off" (06/30/2012)   WISDOM TOOTH EXTRACTION     Social History   Occupational History   Not on file  Tobacco Use   Smoking status: Former    Current packs/day: 0.00    Types: Cigarettes    Quit date: 11/1998    Years since quitting: 24.5   Smokeless tobacco: Never   Tobacco comments:  06/30/2012 "quit cigarette smoking in 1999"  Vaping Use   Vaping status: Never Used  Substance and Sexual Activity   Alcohol use: Not Currently   Drug use: No   Sexual activity: Not Currently    Birth control/protection: Post-menopausal

## 2023-05-13 ENCOUNTER — Telehealth: Payer: Self-pay

## 2023-05-13 NOTE — Telephone Encounter (Signed)
-----   Message from [name] sent at 05/12/2023  9:56 PM EDT ----- Please follow-up week with Meghan [name] thanks

## 2023-05-13 NOTE — Telephone Encounter (Signed)
Please schedule f/u per Dr August Saucer

## 2023-05-17 ENCOUNTER — Ambulatory Visit: Payer: Medicare Other | Admitting: Neurology

## 2023-05-17 ENCOUNTER — Encounter: Payer: Self-pay | Admitting: Neurology

## 2023-05-17 VITALS — BP 120/68 | HR 69 | Ht 67.0 in | Wt 182.0 lb

## 2023-05-17 DIAGNOSIS — G5603 Carpal tunnel syndrome, bilateral upper limbs: Secondary | ICD-10-CM

## 2023-05-17 DIAGNOSIS — G5623 Lesion of ulnar nerve, bilateral upper limbs: Secondary | ICD-10-CM

## 2023-05-17 DIAGNOSIS — Z794 Long term (current) use of insulin: Secondary | ICD-10-CM | POA: Diagnosis not present

## 2023-05-17 DIAGNOSIS — E1142 Type 2 diabetes mellitus with diabetic polyneuropathy: Secondary | ICD-10-CM

## 2023-05-17 NOTE — Patient Instructions (Addendum)
Severe Carpal Tunnel syndrome: Just had the right-sided Carpal Tunnel Release and will take time to regenerate up to even a year. Pending the left carpal Tunnel release. In the meantime can wear braces.    May have ulnar neuropathy at the elbows. Do not put pressure on the nerves (try not to bend elbows we want them to be in a neutral position, also don;t lean on elbows or put pressure on them.      Ulnar neuropathy  Cubital tunnel syndrome/Ulnar neuropath is a condition that causes pain, numbness, tingling, and weakness of the forearm and hand. It happens when your ulnar nerve is irritated or pinched (compressed). The ulnar nerve runs from your shoulder to the small finger (pinkie) of your hand. In most cases, cubital tunnel syndrome is caused by arm motions that are done over and over during sports or while at work. What are the causes? This condition may be caused by: Pressure on the ulnar nerve. This may be from: Repeated elbow bending. Poorly healed broken bones (fractures). Tumors in the elbow. In most cases, these are not cancer. Scar tissue that forms in the elbow after an injury. Bony growths (spurs) near the ulnar nerve. Stretching of the nerve. This may happen when the tissues that connect bones to each other (ligaments) become loose. Trauma to the nerve at the elbow. What increases the risk? You may be more likely to get this condition if: You do manual labor and have to bend your elbow a lot. You play sports that include a lot of throwing motions, such as baseball. You play contact sports, such as football or lacrosse. You do not warm up enough before you do activities. You have diabetes. You have hypothyroidism. This is when your thyroid does not make enough hormones. What are the signs or symptoms? Symptoms of this condition include: Clumsiness or weakness of the hand. You also may not be able to grip or pinch firmly. Aching, soreness, or tenderness of the inner  elbow, forearm, or fingers. You may feel this most in your pinkie or ring finger. More pain when you force your elbow to bend or shooting pain from your elbow to your hand. Reduced control when you throw objects. Tingling, numbness, or a burning feeling in your forearm, hand, or fingers. You may feel this most in your pinkie or ring finger. How is this diagnosed? This condition is diagnosed based on your symptoms, your medical history, and a physical exam. Your health care provider will ask about any injuries. You may also have tests, such as: An electromyogram (EMG). This is done to see how well your nerves are working. A nerve conduction study (NCS). This is done to see how electrical signals pass through your nerves. Imaging tests, such as X-rays, ultrasound, and MRI. These are done to find the source of your nerve problems. How is this treated? This condition may be treated by: Stopping the activities that make your symptoms worse. Icing your elbow. Taking medicines to reduce pain and swelling. Wearing a removable brace. This stops your elbow from bending. Wearing an elbow pad where the ulnar nerve is closest to the skin. Working with a physical therapist. This may help your symptoms get better. It can also help you improve the strength and range of motion of your elbow, forearm, and hand. Steroids. These are medicines that are injected into your body to reduce inflammation. If these treatments do not help, you may need surgery. Follow these instructions at home: Medicines Take  over-the-counter and prescription medicines only as told by your provider. Ask your provider if the medicine prescribed to you requires you to avoid driving or using machinery. If you have a removable brace: Wear the brace as told by your provider. Remove it only as told by your provider. Check the skin around the brace every day. Tell your provider about any concerns. Loosen the brace if your fingers tingle,  become numb, or turn cold and blue. Keep the brace clean. If the brace is not waterproof: Do not let it get wet. Cover it with a watertight covering when you take a bath or shower. Managing pain, stiffness, and swelling  If told, put ice on the injured area. If you have a removable brace, remove it as told by your provider. Put ice in a plastic bag. Place a towel between your skin and the bag. Leave the ice on for 20 minutes, 2-3 times a day. If your skin turns bright red, remove the ice right away to prevent skin damage. The risk of damage is higher if you cannot feel pain, heat, or cold. Move your fingers often to reduce stiffness and swelling. Raise (elevate) the injured area above the level of your heart while you are sitting or lying down. General instructions Do exercises or physical therapy as told by your provider. If you were given an elbow pad or brace, wear it as told by your provider. Keep all follow-up visits. Your provider will check if your symptoms are getting better. They can also adjust the treatment if needed. Contact a health care provider if: Your symptoms get worse. Your symptoms do not get better with treatment. You have new pain. Your hand on the injured side feels numb or cold. This information is not intended to replace advice given to you by your health care provider. Make sure you discuss any questions you have with your health care provider. Document Revised: 05/14/2022 Document Reviewed: 05/14/2022 Elsevier Patient Education  2024 [employer]   Carpal Tunnel Syndrome  Carpal tunnel syndrome is a condition that causes pain, numbness, and weakness in your hand and fingers. The carpal tunnel is a narrow area located on the palm side of your wrist. Repeated wrist motion or certain diseases may cause swelling within the tunnel. This swelling pinches the main nerve in the wrist. The main nerve in the wrist is called the median nerve. What are the  causes? This condition may be caused by: Repeated and forceful wrist and hand motions. Wrist injuries. Arthritis. A cyst or tumor in the carpal tunnel. Fluid buildup during pregnancy. Use of tools that vibrate. Sometimes the cause of this condition is not known. What increases the risk? The following factors may make you more likely to develop this condition: Having a job that requires you to repeatedly or forcefully move your wrist or hand or requires you to use tools that vibrate. This may include jobs that involve using computers, working on an [employer], or working with power tools such as Radiographer, therapeutic. Being a woman. Having certain conditions, such as: Diabetes. Obesity. An underactive thyroid (hypothyroidism). Kidney failure. Rheumatoid arthritis. What are the signs or symptoms? Symptoms of this condition include: A tingling feeling in your fingers, especially in your thumb, index, and middle fingers. Tingling or numbness in your hand. An aching feeling in your entire arm, especially when your wrist and elbow are bent for a long time. Wrist pain that goes up your arm to your shoulder. Pain that goes  down into your palm or fingers. A weak feeling in your hands. You may have trouble grabbing and holding items. Your symptoms may feel worse during the night. How is this diagnosed? This condition is diagnosed with a medical history and physical exam. You may also have tests, including: Electromyogram (EMG). This test measures electrical signals sent by your nerves into the muscles. Nerve conduction study. This test measures how well electrical signals pass through your nerves. Imaging tests, such as X-rays, ultrasound, and MRI. These tests check for possible causes of your condition. How is this treated? This condition may be treated with: Lifestyle changes. It is important to stop or change the activity that caused your condition. Doing exercise and activities to  strengthen and stretch your muscles and tendons (physical therapy). Making lifestyle changes to help with your condition and learning how to do your daily activities safely (occupational therapy). Medicines for pain and inflammation. This may include medicine that is injected into your wrist. A wrist splint or brace. Surgery. Follow these instructions at home: If you have a splint or brace: Wear the splint or brace as told by your health care provider. Remove it only as told by your health care provider. Loosen the splint or brace if your fingers tingle, become numb, or turn cold and blue. Keep the splint or brace clean. If the splint or brace is not waterproof: Do not let it get wet. Cover it with a watertight covering when you take a bath or shower. Managing pain, stiffness, and swelling If directed, put ice on the painful area. To do this: If you have a removeable splint or brace, remove it as told by your health care provider. Put ice in a plastic bag. Place a towel between your skin and the bag or between the splint or brace and the bag. Leave the ice on for 20 minutes, 2-3 times a day. Do not fall asleep with the cold pack on your skin. Remove the ice if your skin turns bright red. This is very important. If you cannot feel pain, heat, or cold, you have a greater risk of damage to the area. Move your fingers often to reduce stiffness and swelling. General instructions Take over-the-counter and prescription medicines only as told by your health care provider. Rest your wrist and hand from any activity that may be causing your pain. If your condition is work related, talk with your employer about changes that can be made, such as getting a wrist pad to use while typing. Do any exercises as told by your health care provider, physical therapist, or occupational therapist. Keep all follow-up visits. This is important. Contact a health care provider if: You have new symptoms. Your pain is  not controlled with medicines. Your symptoms get worse. Get help right away if: You have severe numbness or tingling in your wrist or hand. Summary Carpal tunnel syndrome is a condition that causes pain, numbness, and weakness in your hand and fingers. It is usually caused by repeated wrist motions. Lifestyle changes and medicines are used to treat carpal tunnel syndrome. Surgery may be recommended. Follow your health care provider's instructions about wearing a splint, resting from activity, keeping follow-up visits, and calling for help. This information is not intended to replace advice given to you by your health care provider. Make sure you discuss any questions you have with your health care provider. Document Revised: 12/06/2019 Document Reviewed: 12/06/2019 Elsevier Patient Education  2024 [employer].

## 2023-05-17 NOTE — Progress Notes (Unsigned)
[facility]    Provider:  Dr Lucia Gaskins Requesting Provider: Ignatius Specking, MD Primary Care Provider:  Ignatius Specking, MD  CC:  numbness and tingling  HPI:  Meghan [name] is a 67 y.o. female here as requested by Ignatius Specking, MD for neuropathy. has History of gout; DM (diabetes mellitus), type 2, uncontrolled, periph vascular complic; HTN (hypertension); Cellulitis of leg; ARF (acute renal failure) (HCC); and Osteomyelitis of third toe of left foot (HCC) on their problem list. She had cellulitis in her big toe and had all the toes removed on the right. She had 2 toes cut off on her left due to infection after she clipped her nails. She takes Lyrica for her neuropaythy in the feet. Her hands are tingling and numb. She quit taking Topiramate several months ago and no improvement. She just had CTS surgery. She has bruising and has been evaluated she has 'mature" skin. Se burned her finger index finger on the right and she didn;t feel it. She ahd CTS sugery in September. Recently in the hospital and her glucose was up to 954. She was admitted and treated for Diabetic ketoacidosis. She had severe CTS and had right CTS just had surgery last month and wouldn't expect improvement for some time. Also the diabetes (recently 954 admitted for DKA). She has numbness and tingling in the hands, worse at night or in the morning, all the fingers, weakness in opening jars, has to shake it out, positionally worse but continuous. Has callouses and rough patches where she leans on her elbows right where the ulnar nerve travels. No significant neck pain or radicular symptoms from the neck. No other focal neurologic deficits, associated symptoms, inciting events or modifiable factors.  Reviewed notes, labs and imaging from outside physicians, which showed:  05/03/2023: HgbA1c 8.3  XR cervical spine: reviewed and agree 10/28/2022  2 view radiographs of the lumbar spine shows degenerative disc disease  with advanced degenerative disc disease at C5-6 with bone-on-bone and osteophytic bone spurs.   Reviewed EMG nerve conduction study by Dr. Alvester Morin, reviewed nerve conductions, EMG results and final report.  EMG & NCV Findings: Evaluation of the left median motor and the right median motor nerves showed prolonged distal onset latency (L8.6, R5.9 ms), reduced amplitude (L4.0, R0.3 mV), and decreased conduction velocity (Elbow-Wrist, L35, R47 m/s).  The left ulnar motor nerve showed decreased conduction velocity (B Elbow-Wrist, 50 m/s) and decreased conduction velocity (A Elbow-B Elbow, 34 m/s).  The right ulnar motor nerve showed decreased conduction velocity (B Elbow-Wrist, 44 m/s).  The left median (across palm) sensory and the right median (across palm) sensory nerves showed prolonged distal peak latency (Wrist, L10.2, R6.0 ms), reduced amplitude (L7.6, R0.3 V), and prolonged distal peak latency (Palm, L4.1, R2.2 ms).  The left ulnar sensory and the right ulnar sensory nerves showed prolonged distal peak latency (L4.7, R4.4 ms), reduced amplitude (L10.4, R10.7 V), and decreased conduction velocity (Wrist-5th Digit, L30, R32 m/s).  All remaining nerves (as indicated in the following tables) were within normal limits.  Left vs. Right side comparison data for the median motor nerve indicates abnormal L-R latency difference (2.7 ms), abnormal L-R amplitude difference (92.5 %), and abnormal L-R velocity difference (Elbow-Wrist, 12 m/s).  The ulnar motor nerve indicates abnormal L-R amplitude difference (30.1 %) and abnormal L-R velocity difference (A Elbow-B Elbow, 19 m/s).  All remaining left vs. right side differences were within normal limits.     All examined muscles (as  indicated in the following table) showed no evidence of electrical instability.     Impression: The above electrodiagnostic study is ABNORMAL and reveals evidence of a severe bilateral median nerve entrapment at the wrist (carpal tunnel  syndrome) affecting sensory and motor components.    **There is electrodiagnostic evidence highly suggestive of underlying peripheral polyneuropathy consistent with diabetic neuropathy.   There is no significant electrodiagnostic evidence of any other focal nerve entrapment, brachial plexopathy or cervical radiculopathy.   Recommendations: 1.  Follow-up with referring physician. 2.  Continue current management of symptoms. 3.  Suggest surgical evaluation.  Careful clinical correlation with symptoms.   ___________________________ Naaman Plummer FAAPMR Board Certified, American Board of Physical Medicine and Rehabilitation     Nerve Conduction Studies Anti Sensory Summary Table    Stim Site NR Peak (ms) Norm Peak (ms) P-T Amp (V) Norm P-T Amp Site1 Site2 Delta-P (ms) Dist (cm) Vel (m/s) Norm Vel (m/s)  Left Median Acr Palm Anti Sensory (2nd Digit)  28.9C  Wrist    *10.2 <3.6 *7.6 >10 Wrist Palm 6.1 0.0      Palm    *4.1 <2.0 0.2                Right Median Acr Palm Anti Sensory (2nd Digit)  29.1C  Wrist    *6.0 <3.6 *0.3 >10 Wrist Palm 3.8 0.0      Palm    *2.2 <2.0 5.1                Left Radial Anti Sensory (Base 1st Digit)  28.8C  Wrist    2.7 <3.1 11.3   Wrist Base 1st Digit 2.7 0.0      Right Radial Anti Sensory (Base 1st Digit)  28.6C  Wrist    3.0 <3.1 10.3   Wrist Base 1st Digit 3.0 0.0      Left Ulnar Anti Sensory (5th Digit)  29.6C  Wrist    *4.7 <3.7 *10.4 >15.0 Wrist 5th Digit 4.7 14.0 *30 >38  Right Ulnar Anti Sensory (5th Digit)  29.1C  Wrist    *4.4 <3.7 *10.7 >15.0 Wrist 5th Digit 4.4 14.0 *32 >38    Motor Summary Table    Stim Site NR Onset (ms) Norm Onset (ms) O-P Amp (mV) Norm O-P Amp Site1 Site2 Delta-0 (ms) Dist (cm) Vel (m/s) Norm Vel (m/s)  Left Median Motor (Abd Poll Brev)  29.3C  Wrist    *8.6 <4.2 *4.0 >5 Elbow Wrist 5.7 20.0 *35 >50  Elbow    14.3   2.6                Right Median Motor (Abd Poll Brev)  29.1C  Wrist    *5.9 <4.2 *0.3 >5 Elbow  Wrist 4.3 20.0 *47 >50  Elbow    10.2   4.0                Left Ulnar Motor (Abd Dig Min)  29.5C  Wrist    4.2 <4.2 5.8 >3 B Elbow Wrist 4.2 21.0 *50 >53  B Elbow    8.4   5.7   A Elbow B Elbow 2.9 10.0 *34 >53  A Elbow    11.3   2.3                Right Ulnar Motor (Abd Dig Min)  28.6C  Wrist    4.0 <4.2 8.3 >3 B Elbow Wrist 4.5 20.0 *44 >53  B Elbow  8.5   5.4   A Elbow B Elbow 1.9 10.0 53 >53  A Elbow    10.4   5.4                  EMG    Side Muscle Nerve Root Ins Act Fibs Psw Amp Dur Poly Recrt Int Dennie Bible Comment  Right Abd Poll Brev Median C8-T1 Nml Nml Nml Nml Nml 0 Nml Nml    Right 1stDorInt Ulnar C8-T1 Nml Nml Nml Nml Nml 0 Nml Nml    Right PronatorTeres Median C6-7 Nml Nml Nml Nml Nml 0 Nml Nml    Right Biceps Musculocut C5-6 Nml Nml Nml Nml Nml 0 Nml Nml    Right Deltoid Axillary C5-6 Nml Nml Nml Nml Nml 0 Nml Nml        Nerve Conduction Studies Anti Sensory Left/Right Comparison    Stim Site L Lat (ms) R Lat (ms) L-R Lat (ms) L Amp (V) R Amp (V) L-R Amp (%) Site1 Site2 L Vel (m/s) R Vel (m/s) L-R Vel (m/s)  Median Acr Palm Anti Sensory (2nd Digit)  28.9C  Wrist *10.2 *6.0 4.2 *7.6 *0.3 96.1 Wrist Palm        Palm *4.1 *2.2 1.9 0.2 5.1 96.1            Radial Anti Sensory (Base 1st Digit)  28.8C  Wrist 2.7 3.0 0.3 11.3 10.3 8.8 Wrist Base 1st Digit        Ulnar Anti Sensory (5th Digit)  29.6C  Wrist *4.7 *4.4 0.3 *10.4 *10.7 2.8 Wrist 5th Digit *30 *32 2    Motor Left/Right Comparison    Stim Site L Lat (ms) R Lat (ms) L-R Lat (ms) L Amp (mV) R Amp (mV) L-R Amp (%) Site1 Site2 L Vel (m/s) R Vel (m/s) L-R Vel (m/s)  Median Motor (Abd Poll Brev)  29.3C  Wrist *8.6 *5.9 *2.7 *4.0 *0.3 *92.5 Elbow Wrist *35 *47 *12  Elbow 14.3 10.2 4.1 2.6 4.0 35.0            Ulnar Motor (Abd Dig Min)  29.5C  Wrist 4.2 4.0 0.2 5.8 8.3 *30.1 B Elbow Wrist *50 *44 6  B Elbow 8.4 8.5 0.1 5.7 5.4 5.3 A Elbow B Elbow *34 53 *19  A Elbow 11.3 10.4 0.9 2.3 5.4 57.4                Review of Systems: Patient complains of symptoms per HPI as well as the following symptoms memory loss, confusion, numbness, weakness, easy bleeding, fatigue, swelling in legs, joint pain, cramps, aching muscles, alleries, moles, depression, anxiety, too much sleep, dereased energy, change in appetite, disinterest in activities, racing thoughts.. Pertinent negatives and positives per HPI. All others negative.   Social History   Socioeconomic History   Marital status: Widowed    Spouse name: Not on file   Number of children: 1   Years of education: Not on file   Highest education level: Not on file  Occupational History   Not on file  Tobacco Use   Smoking status: Former    Current packs/day: 0.00    Types: Cigarettes    Quit date: 11/1998    Years since quitting: 24.5   Smokeless tobacco: Never   Tobacco comments:    06/30/2012 "quit cigarette smoking in 1999"  Vaping Use   Vaping status: Never Used  Substance and Sexual Activity   Alcohol use: Yes    Comment: very rare  Drug use: Yes    Types: Marijuana    Comment: daily at night   Sexual activity: Not Currently    Birth control/protection: Post-menopausal  Other Topics Concern   Not on file  Social History Narrative   1 biological son, 2 stepsons   Caffiene 3-4 daily coffee   Working; retiredHeritage manager./ disabled   Some college/CNA   Lives home alone/ widowed   Social Determinants of Health   Financial Resource Strain: Low Risk  (05/06/2023)   Received from [facility]   Overall Financial Resource Strain (CARDIA)    Difficulty of Paying Living Expenses: Not hard at all  Food Insecurity: No Food Insecurity (05/06/2023)   Received from [facility]   Hunger Vital Sign    Worried About Running Out of Food in the Last Year: Never true    Ran Out of Food in the Last Year: Never true  Transportation Needs: No Transportation Needs (05/06/2023)   Received from [facility] - Transportation     Lack of Transportation (Medical): No    Lack of Transportation (Non-Medical): No  Physical Activity: Inactive (05/06/2023)   Received from [facility]   Exercise Vital Sign    Days of Exercise per Week: 0 days    Minutes of Exercise per Session: 0 min  Stress: No Stress Concern Present (05/06/2023)   Received from [facility] - Occupational Stress Questionnaire    Feeling of Stress : Not at all  Social Connections: Moderately Integrated (05/06/2023)   Received from [facility]   Social Connection and Isolation Panel [NHANES]    Frequency of Communication with Friends and Family: More than three times a week    Frequency of Social Gatherings with Friends and Family: More than three times a week    Attends Religious Services: More than 4 times per year    Active Member of [employer] or Organizations: Yes    Attends Banker Meetings: More than 4 times per year    Marital Status: Widowed  Intimate Partner Violence: Not At Risk (05/06/2023)   Received from [facility]   Humiliation, Afraid, Rape, and Kick questionnaire    Fear of Current or Ex-Partner: No    Emotionally Abused: No    Physically Abused: No    Sexually Abused: No    Family History  Adopted: Yes  Problem Relation Age of Onset   Dementia Mother    Heart attack Father     Past Medical History:  Diagnosis Date   Anemia 2020   iron infusions   Anxiety    Arthritis    Cellulitis 06/30/2012   RLE   Depression    Diabetic peripheral neuropathy (HCC)    GERD (gastroesophageal reflux disease)    Headache    High cholesterol    Hypertension    Seasonal allergies    Type II diabetes mellitus (HCC)    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    Patient Active Problem List   Diagnosis Date Noted   Osteomyelitis of third toe of left foot (HCC)    History of gout 06/30/2012   DM (diabetes mellitus), type 2, uncontrolled, periph vascular complic 06/30/2012    HTN (hypertension) 06/30/2012   Cellulitis of leg 06/30/2012   ARF (acute renal failure) (HCC) 06/30/2012    Past Surgical History:  Procedure Laterality Date   AMPUTATION  ~ 2008   "2nd toe  of right foot" (06/30/2012)   AMPUTATION  07/04/2012   Procedure: AMPUTATION RAY;  Surgeon: Nadara Mustard, MD;  Location: [facility] OR;  Service: Orthopedics;  Laterality: Right;  great toe  amp vs ray amp   AMPUTATION Left 02/13/2021   Procedure: LEFT FOOT 3RD AND 4TH RAY AMPUTATION;  Surgeon: Nadara Mustard, MD;  Location: [facility] OR;  Service: Orthopedics;  Laterality: Left;   CARPAL TUNNEL RELEASE Right    04-25-2023 Dr. August Saucer [facility])   INCISION AND DRAINAGE OF WOUND  03/10/2007   "mediastinitis", left medial collarbone (06/30/2012)   LYMPHADENECTOMY  08/09/2005   "bilateral groins; had cancer vulva" (06/30/2012)   TUBAL LIGATION     VULVA SURGERY  08/09/2005   "took cancer off" (06/30/2012)   WISDOM TOOTH EXTRACTION      Current Outpatient Medications  Medication Sig Dispense Refill   Calcium Carb-Cholecalciferol (CALTRATE BONE HEALTH PO) Take 2 tablets by mouth 2 (two) times daily.     cetirizine (ZYRTEC) 10 MG tablet Take 10 mg by mouth in the morning.     doxycycline (VIBRA-TABS) 100 MG tablet Take 1 tablet (100 mg total) by mouth 2 (two) times daily. 20 tablet 0   Dulaglutide (TRULICITY) 0.75 MG/0.5ML SOPN Inject 0.75 mg into the skin every Sunday.     FLUoxetine (PROZAC) 40 MG capsule Take 40 mg by mouth in the morning.     insulin glargine (LANTUS) 100 UNIT/ML injection Inject 75 Units into the skin 2 (two) times daily.     insulin lispro (HUMALOG) 100 UNIT/ML injection Inject 7-10 Units into the skin 3 (three) times daily as needed for high blood sugar.     Krill Oil 350 MG CAPS Take 350 mg by mouth daily.     Lysine 1000 MG TABS Take 1,000 mg by mouth 2 (two) times daily.     meloxicam (MOBIC) 7.5 MG tablet Take 7.5 mg by mouth daily as needed for pain.     metoprolol (LOPRESSOR) 50 MG  tablet Take 50 mg by mouth in the morning.     ONETOUCH ULTRA test strip USE TO TEST BLOOD SUGAR TWICE DAILY     pantoprazole (PROTONIX) 40 MG tablet Take 40 mg by mouth in the morning.     pregabalin (LYRICA) 50 MG capsule Take 50 mg by mouth 3 (three) times daily.     rosuvastatin (CRESTOR) 40 MG tablet Take 40 mg by mouth every evening.     traMADol (ULTRAM) 50 MG tablet Take 1 tablet (50 mg total) by mouth every 6 (six) hours as needed. 20 tablet 0   No current facility-administered medications for this visit.    Allergies as of 05/17/2023   (No Known Allergies)    Vitals: BP 120/68   Pulse 69   Ht 5\' 7"  (1.702 m)   Wt 182 lb (82.6 kg)   BMI 28.51 kg/m  Last Weight:  Wt Readings from Last 1 Encounters:  05/17/23 182 lb (82.6 kg)   Last Height:   Ht Readings from Last 1 Encounters:  05/17/23 5\' 7"  (1.702 m)     Physical exam: Exam: Gen: NAD, conversant, well nourised, obese, well groomed                     CV: RRR, no MRG. No Carotid Bruits. No peripheral edema, warm, nontender, left mild distal edema > right minimal Eyes: Conjunctivae clear without exudates or hemorrhage  Neuro: Detailed Neurologic Exam  Speech:    Speech  is normal; fluent and spontaneous with normal comprehension.  Cognition:    The patient is oriented to person, place, and time;     recent and remote memory intact;     language fluent;     normal attention, concentration,     fund of knowledge Cranial Nerves:    The pupils are equal, round, and reactive to light.  Attempted funduscopic examination but could not visualize due to small pupils.  Visual fields are full to finger confrontation. Extraocular movements are intact. Trigeminal sensation is intact and the muscles of mastication are normal. The face is symmetric. The palate elevates in the midline. Hearing intact. Voice is normal. Shoulder shrug is normal. The tongue has normal motion without fasciculations.   Coordination: FTN impaired  due to shoulder orthopaedic issues has been told she need total shoulder replacements bilaterally  Gait:    Good stride and clearance, walks with right foot eversion   Motor Observation:    No asymmetry, no involuntary movements noted. Atrophy distal in the hands worse in the thenar eminence Tone:    Normal muscle tone.    Posture:    Posture is normal. normal erect .     Strength: cannot test deltoid due to shoulder pain, right biceps 3/5, biateral triceps 4-4+/5.  Left hip flexio 3+-4/5 Right hoip flexion 3/5     Sensation: decreased to all modalities in a gradient fashion in lowers and uppers to the wrists and below the knees(even vibration dimished to the wrists and to the knees)  All toes absent right, toes 3-5 absent on the left     Reflex Exam:  DTR's: Absent ajs 1+ biceps and patellars Toes: Equiv on the left, right without toes Clonus:    Clonus is absent.    Assessment/Plan: This is a patient with uncontrolled diabetes, she recently admitted for diabetic ketoacidosis with a glucose of 954 per patient.  She has had distal amputations.  Her uncontrolled diabetes is highly probable to be the cause of her significant peripheral polyneuropathy including decreased all modalities in a gradient fashion in lowers and uppers to the rest and lowers to below the knees.  She has been diagnosed by EMG nerve conduction studies with "severe carpal tunnel syndrome" and she just recently had CTS release on the right.  She has not had the CTS release on the left.  I also suspect she has ulnar neuropathy as she bends her elbows and she rests her elbows so frequently that if you look there are scaly patchy areas in the distribution of the ulnar nerve near the elbow.  Bilaterally severe carpal tunnel syndrome and likely ulnar neuropathy at the elbows as well as significant polyneuropathy likely due to uncontrolled diabetes.  Patient had CTS release in the last few weeks and explained to her  that it may take much longer for improvement however she also has polyneuropathy so the amount of improvement is not known.  She still has severe carpal tunnel syndrome in the left hand and needs to have that addressed.  As far as the likely ulnar neuropathy at the elbows I discussed at length with her why that is, we reviewed images online, I explained ulnar versus median neuropathy, pathophysiology, patients with diabetes have increased risk of generalized nerve damage and impingement entrapment such as CTS and ulnar neuropathy  PT - she declines, she would prefer to do it herself  Lovely patient, will remember her.  In the future we could repeat an EMG nerve  conduction study however low likelihood of any other etiologies other than cervical radiculopathy and she does not complain of radicular pain.  Severe Carpal Tunnel syndrome: Just had the right-sided Carpal Tunnel Release and will take time to regenerate up to even a year. Pending the left carpal Tunnel release. In the meantime can wear braces.    May have ulnar neuropathy at the elbows. Do not put pressure on the nerves (try not to bend elbows we want them to be in a neutral position, also don;t lean on elbows or put pressure on them.        Cc: Ignatius Specking, MD,  Ignatius Specking, MD  Naomie Dean, MD  [facility] [address] [place], [place] [address]  Phone [phone] Fax [phone]  I spent 60 minutes of face-to-face and non-face-to-face time with patient on the  1. Diabetic polyneuropathy associated with type 2 diabetes mellitus (HCC)   2. Bilateral carpal tunnel syndrome   3. Ulnar neuropathy of both upper extremities    diagnosis.  This included previsit chart review, lab review, study review, order entry, electronic health record documentation, patient education on the different diagnostic and therapeutic options, counseling and coordination of care, risks and benefits of  management, compliance, or risk factor reduction

## 2023-05-18 ENCOUNTER — Encounter: Payer: Self-pay | Admitting: Neurology

## 2023-05-25 ENCOUNTER — Ambulatory Visit (INDEPENDENT_AMBULATORY_CARE_PROVIDER_SITE_OTHER): Payer: Medicare Other | Admitting: Surgical

## 2023-05-25 DIAGNOSIS — Z9889 Other specified postprocedural states: Secondary | ICD-10-CM | POA: Diagnosis not present

## 2023-05-29 ENCOUNTER — Encounter: Payer: Self-pay | Admitting: Surgical

## 2023-05-29 NOTE — Progress Notes (Signed)
Post-Op Visit Note   Patient: Meghan [name]           Date of Birth: 1955-08-21           MRN: [id number] Visit Date: 05/25/2023 PCP: Ignatius Specking, MD   Assessment & Plan:  Chief Complaint:  Chief Complaint  Patient presents with   Right Hand - Routine Post Op   Visit Diagnoses: No diagnosis found.  Plan: Patient is a 67 year old female who presents s/p right carpal tunnel release on 04/25/2023.  She was hospitalized on 05/06/2023 for diabetic ketoacidosis.  She saw Dr. August Saucer on 05/12/2023 with some gapping of her incision and was placed on doxycycline.  She is here today for recheck on the incision.  She has incision that is well-healed in the distal aspect of the incision.  There is eschar formation over the proximal aspect of the incision with no significant expressible drainage.  There is a little bit of redness around the incision but this just seems to be where the skin is peeled off in this area and does not appear to be actual cellulitis.  There is no tenderness through this area.  Redness does somewhat improved with elevation of the extremity.  Plan is to have patient keep an eye on this area.  Avoid submersion.  Return in 9 to 10 days for clinical recheck with Dr. August Saucer.  She is not having any fevers or chills but if she starts to have fevers or chills, worsening pain, worsening redness, any drainage, she will let us know.  Patient agreed with plan.    Follow-Up Instructions: No follow-ups on file.   Orders:  No orders of the defined types were placed in this encounter.  No orders of the defined types were placed in this encounter.   Imaging: No results found.  PMFS History: Patient Active Problem List   Diagnosis Date Noted   Osteomyelitis of third toe of left foot (HCC)    History of gout 06/30/2012   DM (diabetes mellitus), type 2, uncontrolled, periph vascular complic 06/30/2012   HTN (hypertension) 06/30/2012   Cellulitis of leg 06/30/2012   ARF (acute  renal failure) (HCC) 06/30/2012   Past Medical History:  Diagnosis Date   Anemia 2020   iron infusions   Anxiety    Arthritis    Cellulitis 06/30/2012   RLE   Depression    Diabetic peripheral neuropathy (HCC)    GERD (gastroesophageal reflux disease)    Headache    High cholesterol    Hypertension    Seasonal allergies    Type II diabetes mellitus (HCC)    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    Family History  Adopted: Yes  Problem Relation Age of Onset   Dementia Mother    Heart attack Father     Past Surgical History:  Procedure Laterality Date   AMPUTATION  ~ 2008   "2nd toe of right foot" (06/30/2012)   AMPUTATION  07/04/2012   Procedure: AMPUTATION RAY;  Surgeon: Nadara Mustard, MD;  Location: [facility] OR;  Service: Orthopedics;  Laterality: Right;  great toe  amp vs ray amp   AMPUTATION Left 02/13/2021   Procedure: LEFT FOOT 3RD AND 4TH RAY AMPUTATION;  Surgeon: Nadara Mustard, MD;  Location: [facility] OR;  Service: Orthopedics;  Laterality: Left;   CARPAL TUNNEL RELEASE Right    04-25-2023 Dr. August Saucer [facility])   INCISION AND DRAINAGE OF WOUND  03/10/2007   "mediastinitis", left medial collarbone (06/30/2012)  LYMPHADENECTOMY  08/09/2005   "bilateral groins; had cancer vulva" (06/30/2012)   TUBAL LIGATION     VULVA SURGERY  08/09/2005   "took cancer off" (06/30/2012)   WISDOM TOOTH EXTRACTION     Social History   Occupational History   Not on file  Tobacco Use   Smoking status: Former    Current packs/day: 0.00    Types: Cigarettes    Quit date: 11/1998    Years since quitting: 24.5   Smokeless tobacco: Never   Tobacco comments:    06/30/2012 "quit cigarette smoking in 1999"  Vaping Use   Vaping status: Never Used  Substance and Sexual Activity   Alcohol use: Yes    Comment: very rare   Drug use: Yes    Types: Marijuana    Comment: daily at night   Sexual activity: Not Currently    Birth control/protection: Post-menopausal

## 2023-06-03 ENCOUNTER — Encounter: Payer: Self-pay | Admitting: Surgical

## 2023-06-03 ENCOUNTER — Ambulatory Visit (INDEPENDENT_AMBULATORY_CARE_PROVIDER_SITE_OTHER): Payer: Medicare Other | Admitting: Surgical

## 2023-06-03 DIAGNOSIS — Z9889 Other specified postprocedural states: Secondary | ICD-10-CM | POA: Diagnosis not present

## 2023-06-03 NOTE — Progress Notes (Signed)
Post-Op Visit Note   Patient: Meghan [name]           Date of Birth: 10-24-1955           MRN: [id number] Visit Date: 06/03/2023 PCP: Ignatius Specking, MD   Assessment & Plan:  Chief Complaint:  Chief Complaint  Patient presents with   Right Hand - Routine Post Op    04/25/23 right CTR   Visit Diagnoses:  1. S/P carpal tunnel release     Plan: Patient is a 67 year old female who presents s/p right carpal tunnel release on 04/25/2023.  Doing well.  Still having some numbness and tingling at the tips of her fingers, especially her thumb.  Incision is healing well with just small area of eschar at the base of the incision proximally.  There is no fluctuance.  No significant expressible drainage.  Intact EPL, FPL, finger abduction.  2+ radial pulse of the operative extremity.  Plan is to have patient return in 3 weeks for clinical recheck with Dr. August Saucer on her incision and then we can discuss intervention for the left hand carpal tunnel syndrome if patient would like to proceed with this.  Follow-Up Instructions: No follow-ups on file.   Orders:  No orders of the defined types were placed in this encounter.  No orders of the defined types were placed in this encounter.   Imaging: No results found.  PMFS History: Patient Active Problem List   Diagnosis Date Noted   Osteomyelitis of third toe of left foot (HCC)    History of gout 06/30/2012   DM (diabetes mellitus), type 2, uncontrolled, periph vascular complic 06/30/2012   HTN (hypertension) 06/30/2012   Cellulitis of leg 06/30/2012   ARF (acute renal failure) (HCC) 06/30/2012   Past Medical History:  Diagnosis Date   Anemia 2020   iron infusions   Anxiety    Arthritis    Cellulitis 06/30/2012   RLE   Depression    Diabetic peripheral neuropathy (HCC)    GERD (gastroesophageal reflux disease)    Headache    High cholesterol    Hypertension    Seasonal allergies    Type II diabetes mellitus (HCC)    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    Family History  Adopted: Yes  Problem Relation Age of Onset   Dementia Mother    Heart attack Father     Past Surgical History:  Procedure Laterality Date   AMPUTATION  ~ 2008   "2nd toe of right foot" (06/30/2012)   AMPUTATION  07/04/2012   Procedure: AMPUTATION RAY;  Surgeon: Nadara Mustard, MD;  Location: [facility] OR;  Service: Orthopedics;  Laterality: Right;  great toe  amp vs ray amp   AMPUTATION Left 02/13/2021   Procedure: LEFT FOOT 3RD AND 4TH RAY AMPUTATION;  Surgeon: Nadara Mustard, MD;  Location: [facility] OR;  Service: Orthopedics;  Laterality: Left;   CARPAL TUNNEL RELEASE Right    04-25-2023 Dr. August Saucer [facility])   INCISION AND DRAINAGE OF WOUND  03/10/2007   "mediastinitis", left medial collarbone (06/30/2012)   LYMPHADENECTOMY  08/09/2005   "bilateral groins; had cancer vulva" (06/30/2012)   TUBAL LIGATION     VULVA SURGERY  08/09/2005   "took cancer off" (06/30/2012)   WISDOM TOOTH EXTRACTION     Social History   Occupational History   Not on file  Tobacco Use   Smoking status: Former    Current packs/day: 0.00    Types: Cigarettes  Quit date: 11/1998    Years since quitting: 24.5   Smokeless tobacco: Never   Tobacco comments:    06/30/2012 "quit cigarette smoking in 1999"  Vaping Use   Vaping status: Never Used  Substance and Sexual Activity   Alcohol use: Yes    Comment: very rare   Drug use: Yes    Types: Marijuana    Comment: daily at night   Sexual activity: Not Currently    Birth control/protection: Post-menopausal

## 2023-06-27 ENCOUNTER — Ambulatory Visit (INDEPENDENT_AMBULATORY_CARE_PROVIDER_SITE_OTHER): Payer: Medicare Other | Admitting: Orthopedic Surgery

## 2023-06-27 DIAGNOSIS — M19011 Primary osteoarthritis, right shoulder: Secondary | ICD-10-CM

## 2023-07-01 ENCOUNTER — Encounter: Payer: Self-pay | Admitting: Orthopedic Surgery

## 2023-07-01 NOTE — Progress Notes (Signed)
Post-Op Visit Note   Patient: Meghan [name]           Date of Birth: 05/22/1956           MRN: [id number] Visit Date: 06/27/2023 PCP: Ignatius Specking, MD   Assessment & Plan:  Chief Complaint:  Chief Complaint  Patient presents with   Right Wrist - Routine Post Op    RIGHT CTR (surgery date 04-25-23)   Visit Diagnoses:  1. Arthritis of right shoulder region     Plan: Meghan [name] is a 67 year old patient who underwent right carpal tunnel release 04/25/2023.  Still has some numbness which is resolving.  On examination abductor pollicis brevis strength is intact.  Incision intact.  Patient was scheduled for right reverse shoulder replacement.  She does have severe limitation of function with that right shoulder as well as pain.  Plan at this time is 48-month clinical recheck on the wrist.  Thin cut CT scan pending for right shoulder.  If she is medically stable we can proceed with reverse shoulder replacement in January.  Last hemoglobin A1c was 8.3 in September of this year.  Follow-Up Instructions: No follow-ups on file.   Orders:  Orders Placed This Encounter  Procedures   CT SHOULDER RIGHT WO CONTRAST   No orders of the defined types were placed in this encounter.   Imaging: No results found.  PMFS History: Patient Active Problem List   Diagnosis Date Noted   Osteomyelitis of third toe of left foot (HCC)    History of gout 06/30/2012   DM (diabetes mellitus), type 2, uncontrolled, periph vascular complic 06/30/2012   HTN (hypertension) 06/30/2012   Cellulitis of leg 06/30/2012   ARF (acute renal failure) (HCC) 06/30/2012   Past Medical History:  Diagnosis Date   Anemia 2020   iron infusions   Anxiety    Arthritis    Cellulitis 06/30/2012   RLE   Depression    Diabetic peripheral neuropathy (HCC)    GERD (gastroesophageal reflux disease)    Headache    High cholesterol    Hypertension    Seasonal allergies    Type II diabetes mellitus (HCC)     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    Family History  Adopted: Yes  Problem Relation Age of Onset   Dementia Mother    Heart attack Father     Past Surgical History:  Procedure Laterality Date   AMPUTATION  ~ 2008   "2nd toe of right foot" (06/30/2012)   AMPUTATION  07/04/2012   Procedure: AMPUTATION RAY;  Surgeon: Nadara Mustard, MD;  Location: [facility] OR;  Service: Orthopedics;  Laterality: Right;  great toe  amp vs ray amp   AMPUTATION Left 02/13/2021   Procedure: LEFT FOOT 3RD AND 4TH RAY AMPUTATION;  Surgeon: Nadara Mustard, MD;  Location: [facility] OR;  Service: Orthopedics;  Laterality: Left;   CARPAL TUNNEL RELEASE Right    04-25-2023 Dr. August Saucer [facility])   INCISION AND DRAINAGE OF WOUND  03/10/2007   "mediastinitis", left medial collarbone (06/30/2012)   LYMPHADENECTOMY  08/09/2005   "bilateral groins; had cancer vulva" (06/30/2012)   TUBAL LIGATION     VULVA SURGERY  08/09/2005   "took cancer off" (06/30/2012)   WISDOM TOOTH EXTRACTION     Social History   Occupational History   Not on file  Tobacco Use   Smoking status: Former    Current packs/day: 0.00    Types: Cigarettes    Quit date: 11/1998  Years since quitting: 24.6   Smokeless tobacco: Never   Tobacco comments:    06/30/2012 "quit cigarette smoking in 1999"  Vaping Use   Vaping status: Never Used  Substance and Sexual Activity   Alcohol use: Yes    Comment: very rare   Drug use: Yes    Types: Marijuana    Comment: daily at night   Sexual activity: Not Currently    Birth control/protection: Post-menopausal

## 2023-07-12 ENCOUNTER — Ambulatory Visit
Admission: RE | Admit: 2023-07-12 | Discharge: 2023-07-12 | Disposition: A | Discharge status: Home / Self Care | Payer: Medicare Other | Source: Ambulatory Visit | Attending: Orthopedic Surgery | Admitting: Orthopedic Surgery

## 2023-07-12 DIAGNOSIS — M19011 Primary osteoarthritis, right shoulder: Secondary | ICD-10-CM

## 2023-07-26 NOTE — Progress Notes (Signed)
Needs CBC, albumin, and hemoglobin A1c prior to posting.  Last labs in the chart are from September -can she come in and get those done from [place] or does she need to go to primary care?  Last hemoglobin A1c was 8.3 in September

## 2023-07-27 ENCOUNTER — Telehealth: Payer: Self-pay

## 2023-07-27 ENCOUNTER — Other Ambulatory Visit: Payer: Self-pay

## 2023-07-27 NOTE — Progress Notes (Signed)
I talked with patients PCP office ( Dr Sherril Croon ) Was advised that we needed to fax script with requested labs to [phone] and they would let us know if one of the providers there can order the labs. Was advised they could not draw labs with external lab order.  RX was faxed.

## 2023-07-27 NOTE — Telephone Encounter (Signed)
-----   Message from [name] sent at 07/26/2023  1:10 PM EST ----- Needs CBC, albumin, and hemoglobin A1c prior to posting.  Last labs in the chart are from September -can she come in and get those done from [place] or does she need to go to primary care?  Last hemoglobin A1c was 8.3 in September

## 2023-07-27 NOTE — Telephone Encounter (Signed)
See other note. Patient prefers to have done in [place] so she does not have to get a ride to [place].

## 2023-07-27 NOTE — Telephone Encounter (Signed)
Patient needs nurse only visit for labs

## 2023-09-22 NOTE — Progress Notes (Signed)
Surgical Instructions   Your procedure is scheduled on September 29, 2023. Report to [facility] Main Entrance "A" at 9:20 A.M., then check in with the Admitting office. Any questions or running late day of surgery: call [phone]  Questions prior to your surgery date: call [phone], Monday-Friday, 8am-4pm. If you experience any cold or flu symptoms such as cough, fever, chills, shortness of breath, etc. between now and your scheduled surgery, please notify us at the above number.     Remember:  Do not eat after midnight the night before your surgery  You may drink clear liquids until  8:20 the morning of your surgery.   Clear liquids allowed are: Water, Non-Citrus Juices (without pulp), Carbonated Beverages, Clear Tea (no milk, honey, etc.), Black Coffee Only (NO MILK, CREAM OR POWDERED CREAMER of any kind), and Gatorade.  Patient Instructions  The night before surgery:  No food after midnight. ONLY clear liquids after midnight   The day of surgery (if you have diabetes): Drink ONE (1) 12 oz G2 given to you in your pre admission testing appointment by 8:30 the morning of surgery. Drink in one sitting. Do not sip.  This drink was given to you during your hospital  pre-op appointment visit.  Nothing else to drink after completing the  12 oz bottle of G2.         If you have questions, please contact your surgeon's office.   Take these medicines the morning of surgery with A SIP OF WATER cetirizine (ZYRTEC)  FLUoxetine (PROZAC)  metoprolol (LOPRESSOR)  pantoprazole (PROTONIX)  pregabalin (LYRICA)   One week prior to surgery, STOP taking any Aspirin (unless otherwise instructed by your surgeon) Aleve, Naproxen, Ibuprofen, Motrin, Advil, Goody's, BC's, all herbal medications, fish oil, and non-prescription vitamins. This includes your meloxicam (MOBIC) .  WHAT DO I DO ABOUT MY DIABETES MEDICATION?   Do not take oral diabetes medicines (pills) the morning of surgery.  THE  NIGHT BEFORE SURGERY, take 32.5 units of LANTUS insulin.       THE MORNING OF SURGERY, take 32.5 units of LANTUS insulin. THE MORNING OF SURGERY TAKE 5 UNITS OF HUMALOG   Insulin       Dulaglutide LAST DOSE 09-14-23   The day of surgery, do not take other diabetes injectables, including Byetta (exenatide), Bydureon (exenatide ER), Victoza (liraglutide), or Trulicity (dulaglutide).    HOW TO MANAGE YOUR DIABETES BEFORE AND AFTER SURGERY  Why is it important to control my blood sugar before and after surgery? Improving blood sugar levels before and after surgery helps healing and can limit problems. A way of improving blood sugar control is eating a healthy diet by:  Eating less sugar and carbohydrates  Increasing activity/exercise  Talking with your doctor about reaching your blood sugar goals High blood sugars (greater than 180 mg/dL) can raise your risk of infections and slow your recovery, so you will need to focus on controlling your diabetes during the weeks before surgery. Make sure that the doctor who takes care of your diabetes knows about your planned surgery including the date and location.  How do I manage my blood sugar before surgery? Check your blood sugar at least 4 times a day, starting 2 days before surgery, to make sure that the level is not too high or low.  Check your blood sugar the morning of your surgery when you wake up and every 2 hours until you get to the Short Stay unit.  If your blood sugar is  less than 70 mg/dL, you will need to treat for low blood sugar: Do not take insulin. Treat a low blood sugar (less than 70 mg/dL) with  cup of clear juice (cranberry or apple), 4 glucose tablets, OR glucose gel. Recheck blood sugar in 15 minutes after treatment (to make sure it is greater than 70 mg/dL). If your blood sugar is not greater than 70 mg/dL on recheck, call [phone] for further instructions. Report your blood sugar to the short stay nurse when you get  to Short Stay.  If you are admitted to the hospital after surgery: Your blood sugar will be checked by the staff and you will probably be given insulin after surgery (instead of oral diabetes medicines) to make sure you have good blood sugar levels. The goal for blood sugar control after surgery is 80-180 mg/dL.                      Do NOT Smoke (Tobacco/Vaping) for 24 hours prior to your procedure.  If you use a CPAP at night, you may bring your mask/headgear for your overnight stay.   You will be asked to remove any contacts, glasses, piercing's, hearing aid's, dentures/partials prior to surgery. Please bring cases for these items if needed.    Patients discharged the day of surgery will not be allowed to drive home, and someone needs to stay with them for 24 hours.  SURGICAL WAITING ROOM VISITATION Patients may have no more than 2 support people in the waiting area - these visitors may rotate.   Pre-op nurse will coordinate an appropriate time for 1 ADULT support person, who may not rotate, to accompany patient in pre-op.  Children under the age of 15 must have an adult with them who is not the patient and must remain in the main waiting area with an adult.  If the patient needs to stay at the hospital during part of their recovery, the visitor guidelines for inpatient rooms apply.  Please refer to the [facility] website for the visitor guidelines for any additional information.   If you received a COVID test during your pre-op visit  it is requested that you wear a mask when out in public, stay away from anyone that may not be feeling well and notify your surgeon if you develop symptoms. If you have been in contact with anyone that has tested positive in the last 10 days please notify you surgeon.   Oral Hygiene is also important to reduce your risk of infection.  Remember - BRUSH YOUR TEETH THE MORNING OF SURGERY WITH YOUR REGULAR TOOTHPASTE  Meghan [name]- Preparing for Total  Shoulder Arthroplasty  Before surgery, you can play an important role. Because skin is not sterile, your skin needs to be as free of germs as possible. You can reduce the number of germs on your skin by using the following products.   Benzoyl Peroxide Gel  o Reduces the number of germs present on the skin  o Applied twice a day to shoulder area starting two days before surgery   Chlorhexidine Gluconate (CHG) Soap (instructions listed above on how to wash with CHG Soap)  o An antiseptic cleaner that kills germs and bonds with the skin to continue killing germs even after washing  o Used for showering the night before surgery and morning of surgery   ==================================================================  Please follow these instructions carefully:  BENZOYL PEROXIDE 5% GEL  Please do not use if you have an  allergy to benzoyl peroxide. If your skin becomes reddened/irritated stop using the benzoyl peroxide.  Starting two days before surgery, apply as follows:  1. Apply benzoyl peroxide in the morning and at night. Apply after taking a shower. If you are not taking a shower clean entire shoulder front, back, and side along with the armpit with a clean wet washcloth.  2. Place a quarter-sized dollop on your SHOULDER and rub in thoroughly, making sure to cover the front, back, and side of your shoulder, along with the armpit.   2 Days prior to Surgery First Dose on _____________ Morning Second Dose on ______________ Night  Day Before Surgery First Dose on ______________ Morning Night before surgery wash (entire body except face and private areas) with CHG Soap THEN Second Dose on ____________ Night   Morning of Surgery  wash BODY AGAIN with CHG Soap   4. Do NOT apply benzoyl peroxide gel on the day of surgery   possible. You can reduce the number of germs on your skin by washing with CHG (chlorahexidine gluconate) Soap before surgery.  CHG is an antiseptic  cleaner which kills germs and bonds with the skin to continue killing germs even after washing.     Please do not use if you have an allergy to CHG or antibacterial soaps. If your skin becomes reddened/irritated stop using the CHG.  Do not shave (including legs and underarms) for at least 48 hours prior to first CHG shower. It is OK to shave your face.  Please follow these instructions carefully.      Please read over the following fact sheets that you were given.     Pre-operative 5 CHG Bathing Instructions   You can play a key role in reducing the risk of infection after surgery. Your skin needs to be as free of germs as possible. You can reduce the number of germs on your skin by washing with CHG (chlorhexidine gluconate) soap before surgery. CHG is an antiseptic soap that kills germs and continues to kill germs even after washing.   DO NOT use if you have an allergy to chlorhexidine/CHG or antibacterial soaps. If your skin becomes reddened or irritated, stop using the CHG and notify one of our RNs at [phone].   Please shower with the CHG soap starting 4 days before surgery using the following schedule:     Please keep in mind the following:  DO NOT shave, including legs and underarms, starting the day of your first shower.   You may shave your face at any point before/day of surgery.  Place clean sheets on your bed the day you start using CHG soap. Use a clean washcloth (not used since being washed) for each shower. DO NOT sleep with pets once you start using the CHG.   CHG Shower Instructions:  Wash your face and private area with normal soap. If you choose to wash your hair, wash first with your normal shampoo.  After you use shampoo/soap, rinse your hair and body thoroughly to remove shampoo/soap residue.  Turn the water OFF and apply about 3 tablespoons (45 ml) of CHG soap to a CLEAN washcloth.  Apply CHG soap ONLY FROM YOUR NECK DOWN TO YOUR TOES (washing for 3-5  minutes)  DO NOT use CHG soap on face, private areas, open wounds, or sores.  Pay special attention to the area where your surgery is being performed.  If you are having back surgery, having someone wash your back for you may be  helpful. Wait 2 minutes after CHG soap is applied, then you may rinse off the CHG soap.  Pat dry with a clean towel  Put on clean clothes/pajamas   If you choose to wear lotion, please use ONLY the CHG-compatible lotions that are listed below.  Additional instructions for the day of surgery: DO NOT APPLY any lotions, deodorants, cologne, or perfumes.   Do not bring valuables to the hospital. [facility] is not responsible for any belongings/valuables. Do not wear nail polish, gel polish, artificial nails, or any other type of covering on natural nails (fingers and toes) Do not wear jewelry or makeup Put on clean/comfortable clothes.  Please brush your teeth.  Ask your nurse before applying any prescription medications to the skin.     CHG Compatible Lotions   Aveeno Moisturizing lotion  Cetaphil Moisturizing Cream  Cetaphil Moisturizing Lotion  Clairol Herbal Essence Moisturizing Lotion, Dry Skin  Clairol Herbal Essence Moisturizing Lotion, Extra Dry Skin  Clairol Herbal Essence Moisturizing Lotion, Normal Skin  Curel Age Defying Therapeutic Moisturizing Lotion with Alpha Hydroxy  Curel Extreme Care Body Lotion  Curel Soothing Hands Moisturizing Hand Lotion  Curel Therapeutic Moisturizing Cream, Fragrance-Free  Curel Therapeutic Moisturizing Lotion, Fragrance-Free  Curel Therapeutic Moisturizing Lotion, Original Formula  Eucerin Daily Replenishing Lotion  Eucerin Dry Skin Therapy Plus Alpha Hydroxy Crme  Eucerin Dry Skin Therapy Plus Alpha Hydroxy Lotion  Eucerin Original Crme  Eucerin Original Lotion  Eucerin Plus Crme Eucerin Plus Lotion  Eucerin TriLipid Replenishing Lotion  Keri Anti-Bacterial Hand Lotion  Keri Deep Conditioning Original  Lotion Dry Skin Formula Softly Scented  Keri Deep Conditioning Original Lotion, Fragrance Free Sensitive Skin Formula  Keri Lotion Fast Absorbing Fragrance Free Sensitive Skin Formula  Keri Lotion Fast Absorbing Softly Scented Dry Skin Formula  Keri Original Lotion  Keri Skin Renewal Lotion Keri Silky Smooth Lotion  Keri Silky Smooth Sensitive Skin Lotion  Nivea Body Creamy Conditioning Oil  Nivea Body Extra Enriched Lotion  Nivea Body Original Lotion  Nivea Body Sheer Moisturizing Lotion Nivea Crme  Nivea Skin Firming Lotion  NutraDerm 30 Skin Lotion  NutraDerm Skin Lotion  NutraDerm Therapeutic Skin Cream  NutraDerm Therapeutic Skin Lotion  ProShield Protective Hand Cream  Provon moisturizing lotion  Please read over the following fact sheets that you were given.

## 2023-09-23 ENCOUNTER — Encounter (HOSPITAL_COMMUNITY)
Admission: RE | Admit: 2023-09-23 | Discharge: 2023-09-23 | Disposition: A | Discharge status: Home / Self Care | Payer: Medicare Other | Source: Ambulatory Visit | Attending: Orthopedic Surgery | Admitting: Orthopedic Surgery

## 2023-09-23 ENCOUNTER — Encounter (HOSPITAL_COMMUNITY): Payer: Self-pay

## 2023-09-23 ENCOUNTER — Other Ambulatory Visit: Payer: Self-pay

## 2023-09-23 VITALS — BP 131/63 | HR 77 | Temp 98.3°F | Resp 17 | Ht 67.0 in | Wt 197.0 lb

## 2023-09-23 DIAGNOSIS — Z01818 Encounter for other preprocedural examination: Secondary | ICD-10-CM | POA: Diagnosis present

## 2023-09-23 DIAGNOSIS — Z01812 Encounter for preprocedural laboratory examination: Secondary | ICD-10-CM | POA: Insufficient documentation

## 2023-09-23 DIAGNOSIS — Z79899 Other long term (current) drug therapy: Secondary | ICD-10-CM | POA: Diagnosis not present

## 2023-09-23 LAB — GLUCOSE, CAPILLARY: Glucose-Capillary: 85 mg/dL (ref 70–99)

## 2023-09-23 LAB — BASIC METABOLIC PANEL
Anion gap: 10 (ref 5–15)
BUN: 27 mg/dL — ABNORMAL HIGH (ref 8–23)
CO2: 27 mmol/L (ref 22–32)
Calcium: 9.4 mg/dL (ref 8.9–10.3)
Chloride: 103 mmol/L (ref 98–111)
Creatinine, Ser: 1.35 mg/dL — ABNORMAL HIGH (ref 0.44–1.00)
GFR, Estimated: 43 mL/min — ABNORMAL LOW (ref >60)
Glucose, Bld: 83 mg/dL (ref 70–99)
Potassium: 4.8 mmol/L (ref 3.5–5.1)
Sodium: 140 mmol/L (ref 135–145)

## 2023-09-23 LAB — URINALYSIS, W/ REFLEX TO CULTURE (INFECTION SUSPECTED)
Bilirubin Urine: NEGATIVE
Glucose, UA: NEGATIVE mg/dL
Hgb urine dipstick: NEGATIVE
Ketones, ur: NEGATIVE mg/dL
Leukocytes,Ua: NEGATIVE
Nitrite: NEGATIVE
Protein, ur: NEGATIVE mg/dL
Specific Gravity, Urine: 1.016 (ref 1.005–1.030)
pH: 6 (ref 5.0–8.0)

## 2023-09-23 LAB — CBC
HCT: 35.8 % — ABNORMAL LOW (ref 36.0–46.0)
Hemoglobin: 11.4 g/dL — ABNORMAL LOW (ref 12.0–15.0)
MCH: 28.6 pg (ref 26.0–34.0)
MCHC: 31.8 g/dL (ref 30.0–36.0)
MCV: 89.9 fL (ref 80.0–100.0)
Platelets: 248 10*3/uL (ref 150–400)
RBC: 3.98 MIL/uL (ref 3.87–5.11)
RDW: 14.4 % (ref 11.5–15.5)
WBC: 10.6 10*3/uL — ABNORMAL HIGH (ref 4.0–10.5)
nRBC: 0 % (ref 0.0–0.2)

## 2023-09-23 LAB — SURGICAL PCR SCREEN
MRSA, PCR: NEGATIVE
Staphylococcus aureus: POSITIVE — AB

## 2023-09-23 LAB — HEMOGLOBIN A1C
Hgb A1c MFr Bld: 6.2 % — ABNORMAL HIGH (ref 4.8–5.6)
Mean Plasma Glucose: 131.24 mg/dL

## 2023-09-23 NOTE — Progress Notes (Addendum)
PCP - Dr. Doreen Beam Cardiologist - denies  PPM/ICD - denies    Chest x-ray - 07/28/2007 EKG - 01/06/23 Stress Test - denies ECHO - denies Cardiac Cath - denies  Sleep Study - denies   Fasting Blood Sugar - 60-80 Checks Blood Sugar continously/day (sensor on right arm)  Last dose of GLP1 agonist-  09/21/23 GLP1 instructions: Pt knows not to take any further doses  ASA/Blood Thinner Instructions: n/a   ERAS Protcol - clears until 0820 PRE-SURGERY Ensure given at PAT  COVID TEST- n/a   Anesthesia review: no  Patient denies shortness of breath, fever, cough and chest pain at PAT appointment   All instructions explained to the patient, with a verbal understanding of the material. Patient agrees to go over the instructions while at home for a better understanding.  The opportunity to ask questions was provided.

## 2023-09-23 NOTE — Progress Notes (Signed)
Surgical Instructions   Your procedure is scheduled on September 29, 2023. Report to [facility] Main Entrance "A" at 9:20 A.M., then check in with the Admitting office. Any questions or running late day of surgery: call [phone]  Questions prior to your surgery date: call [phone], Monday-Friday, 8am-4pm. If you experience any cold or flu symptoms such as cough, fever, chills, shortness of breath, etc. between now and your scheduled surgery, please notify us at the above number.     Remember:  Do not eat after midnight the night before your surgery  You may drink clear liquids until  8:20 the morning of your surgery.   Clear liquids allowed are: Water, Non-Citrus Juices (without pulp), Carbonated Beverages, Clear Tea (no milk, honey, etc.), Black Coffee Only (NO MILK, CREAM OR POWDERED CREAMER of any kind), and Gatorade.  Patient Instructions  The night before surgery:  No food after midnight. ONLY clear liquids after midnight   The day of surgery (if you have diabetes): Drink ONE (1) 12 oz G2 given to you in your pre admission testing appointment by 8:30 the morning of surgery. Drink in one sitting. Do not sip.  This drink was given to you during your hospital  pre-op appointment visit.  Nothing else to drink after completing the  12 oz bottle of G2.         If you have questions, please contact your surgeon's office.     Take these medicines the morning of surgery with A SIP OF WATER:  cetirizine (ZYRTEC)  FLUoxetine (PROZAC)  metoprolol (LOPRESSOR)  pantoprazole (PROTONIX)  pregabalin (LYRICA)    One week prior to surgery, STOP taking any Aspirin (unless otherwise instructed by your surgeon) Aleve, Naproxen, Ibuprofen, Motrin, Advil, Goody's, BC's, all herbal medications, fish oil, and non-prescription vitamins. This includes your medication: meloxicam (MOBIC) .   WHAT DO I DO ABOUT MY DIABETES MEDICATION?   THE NIGHT BEFORE SURGERY, take 32.5 units of LANTUS  insulin.      THE MORNING OF SURGERY, take 32.5 units of LANTUS insulin.  STOP taking your Dulaglutide one week prior to surgery. DO NOT take any doses after February 12th.  Morning of surgery, if CBG is greater than 220 mg/dl, may take HALF your normal dose of insulin lispro (HUMALOG).   HOW TO MANAGE YOUR DIABETES BEFORE AND AFTER SURGERY  Why is it important to control my blood sugar before and after surgery? Improving blood sugar levels before and after surgery helps healing and can limit problems. A way of improving blood sugar control is eating a healthy diet by:  Eating less sugar and carbohydrates  Increasing activity/exercise  Talking with your doctor about reaching your blood sugar goals High blood sugars (greater than 180 mg/dL) can raise your risk of infections and slow your recovery, so you will need to focus on controlling your diabetes during the weeks before surgery. Make sure that the doctor who takes care of your diabetes knows about your planned surgery including the date and location.  How do I manage my blood sugar before surgery? Check your blood sugar at least 4 times a day, starting 2 days before surgery, to make sure that the level is not too high or low.  Check your blood sugar the morning of your surgery when you wake up and every 2 hours until you get to the Short Stay unit.  If your blood sugar is less than 70 mg/dL, you will need to treat for low blood sugar: Do  not take insulin. Treat a low blood sugar (less than 70 mg/dL) with  cup of clear juice (cranberry or apple), 4 glucose tablets, OR glucose gel. Recheck blood sugar in 15 minutes after treatment (to make sure it is greater than 70 mg/dL). If your blood sugar is not greater than 70 mg/dL on recheck, call [phone] for further instructions. Report your blood sugar to the short stay nurse when you get to Short Stay.  If you are admitted to the hospital after surgery: Your blood sugar will be  checked by the staff and you will probably be given insulin after surgery (instead of oral diabetes medicines) to make sure you have good blood sugar levels. The goal for blood sugar control after surgery is 80-180 mg/dL.                      Do NOT Smoke (Tobacco/Vaping) for 24 hours prior to your procedure.  If you use a CPAP at night, you may bring your mask/headgear for your overnight stay.   You will be asked to remove any contacts, glasses, piercing's, hearing aid's, dentures/partials prior to surgery. Please bring cases for these items if needed.    Patients discharged the day of surgery will not be allowed to drive home, and someone needs to stay with them for 24 hours.  SURGICAL WAITING ROOM VISITATION Patients may have no more than 2 support people in the waiting area - these visitors may rotate.   Pre-op nurse will coordinate an appropriate time for 1 ADULT support person, who may not rotate, to accompany patient in pre-op.  Children under the age of 26 must have an adult with them who is not the patient and must remain in the main waiting area with an adult.  If the patient needs to stay at the hospital during part of their recovery, the visitor guidelines for inpatient rooms apply.  Please refer to the [facility] website for the visitor guidelines for any additional information.   If you received a COVID test during your pre-op visit  it is requested that you wear a mask when out in public, stay away from anyone that may not be feeling well and notify your surgeon if you develop symptoms. If you have been in contact with anyone that has tested positive in the last 10 days please notify you surgeon.       Pre-operative 5 CHG Bathing Instructions   You can play a key role in reducing the risk of infection after surgery. Your skin needs to be as free of germs as possible. You can reduce the number of germs on your skin by washing with CHG (chlorhexidine gluconate) soap  before surgery. CHG is an antiseptic soap that kills germs and continues to kill germs even after washing.   DO NOT use if you have an allergy to chlorhexidine/CHG or antibacterial soaps. If your skin becomes reddened or irritated, stop using the CHG and notify one of our RNs at [phone].   Please shower with the CHG soap starting 4 days before surgery using the following schedule:     Please keep in mind the following:  DO NOT shave, including legs and underarms, starting the day of your first shower.   You may shave your face at any point before/day of surgery.  Place clean sheets on your bed the day you start using CHG soap. Use a clean washcloth (not used since being washed) for each shower. DO NOT sleep  with pets once you start using the CHG.   CHG Shower Instructions:  Wash your face and private area with normal soap. If you choose to wash your hair, wash first with your normal shampoo.  After you use shampoo/soap, rinse your hair and body thoroughly to remove shampoo/soap residue.  Turn the water OFF and apply about 3 tablespoons (45 ml) of CHG soap to a CLEAN washcloth.  Apply CHG soap ONLY FROM YOUR NECK DOWN TO YOUR TOES (washing for 3-5 minutes)  DO NOT use CHG soap on face, private areas, open wounds, or sores.  Pay special attention to the area where your surgery is being performed.  If you are having back surgery, having someone wash your back for you may be helpful. Wait 2 minutes after CHG soap is applied, then you may rinse off the CHG soap.  Pat dry with a clean towel  Put on clean clothes/pajamas   If you choose to wear lotion, please use ONLY the CHG-compatible lotions that are listed below.  Additional instructions for the day of surgery: DO NOT APPLY any lotions, deodorants, cologne, or perfumes.   Do not bring valuables to the hospital. [facility] is not responsible for any belongings/valuables. Do not wear nail polish, gel polish, artificial nails, or any  other type of covering on natural nails (fingers and toes) Do not wear jewelry or makeup Put on clean/comfortable clothes.  Please brush your teeth.  Ask your nurse before applying any prescription medications to the skin.     CHG Compatible Lotions   Aveeno Moisturizing lotion  Cetaphil Moisturizing Cream  Cetaphil Moisturizing Lotion  Clairol Herbal Essence Moisturizing Lotion, Dry Skin  Clairol Herbal Essence Moisturizing Lotion, Extra Dry Skin  Clairol Herbal Essence Moisturizing Lotion, Normal Skin  Curel Age Defying Therapeutic Moisturizing Lotion with Alpha Hydroxy  Curel Extreme Care Body Lotion  Curel Soothing Hands Moisturizing Hand Lotion  Curel Therapeutic Moisturizing Cream, Fragrance-Free  Curel Therapeutic Moisturizing Lotion, Fragrance-Free  Curel Therapeutic Moisturizing Lotion, Original Formula  Eucerin Daily Replenishing Lotion  Eucerin Dry Skin Therapy Plus Alpha Hydroxy Crme  Eucerin Dry Skin Therapy Plus Alpha Hydroxy Lotion  Eucerin Original Crme  Eucerin Original Lotion  Eucerin Plus Crme Eucerin Plus Lotion  Eucerin TriLipid Replenishing Lotion  Keri Anti-Bacterial Hand Lotion  Keri Deep Conditioning Original Lotion Dry Skin Formula Softly Scented  Keri Deep Conditioning Original Lotion, Fragrance Free Sensitive Skin Formula  Keri Lotion Fast Absorbing Fragrance Free Sensitive Skin Formula  Keri Lotion Fast Absorbing Softly Scented Dry Skin Formula  Keri Original Lotion  Keri Skin Renewal Lotion Keri Silky Smooth Lotion  Keri Silky Smooth Sensitive Skin Lotion  Nivea Body Creamy Conditioning Oil  Nivea Body Extra Enriched Lotion  Nivea Body Original Lotion  Nivea Body Sheer Moisturizing Lotion Nivea Crme  Nivea Skin Firming Lotion  NutraDerm 30 Skin Lotion  NutraDerm Skin Lotion  NutraDerm Therapeutic Skin Cream  NutraDerm Therapeutic Skin Lotion  ProShield Protective Hand Cream  Provon moisturizing lotion  [place]- Preparing for  Total Shoulder Arthroplasty  Before surgery, you can play an important role. Because skin is not sterile, your skin needs to be as free of germs as possible. You can reduce the number of germs on your skin by using the following products.   Benzoyl Peroxide Gel  o Reduces the number of germs present on the skin  o Applied twice a day to shoulder area starting two days before surgery   Chlorhexidine Gluconate (CHG) Soap (instructions  listed above on how to wash with CHG Soap)  o An antiseptic cleaner that kills germs and bonds with the skin to continue killing germs even after washing  o Used for showering the night before surgery and morning of surgery   ==================================================================  Please follow these instructions carefully:  BENZOYL PEROXIDE 5% GEL  Please do not use if you have an allergy to benzoyl peroxide. If your skin becomes reddened/irritated stop using the benzoyl peroxide.  Starting two days before surgery, apply as follows:  1. Apply benzoyl peroxide in the morning and at night. Apply after taking a shower. If you are not taking a shower clean entire shoulder front, back, and side along with the armpit with a clean wet washcloth.  2. Place a quarter-sized dollop on your SHOULDER and rub in thoroughly, making sure to cover the front, back, and side of your shoulder, along with the armpit.   2 Days prior to Surgery First Dose on _____________ Morning Second Dose on ______________ Night  Day Before Surgery First Dose on ______________ Morning Night before surgery wash (entire body except face and private areas) with CHG Soap THEN Second Dose on ____________ Night   Morning of Surgery  wash BODY AGAIN with CHG Soap   4. Do NOT apply benzoyl peroxide gel on the day of surgery   possible. You can reduce the number of germs on your skin by washing with CHG (chlorahexidine gluconate) Soap before surgery.  CHG is an antiseptic  cleaner which kills germs and bonds with the skin to continue killing germs even after washing.     Please do not use if you have an allergy to CHG or antibacterial soaps. If your skin becomes reddened/irritated stop using the CHG.  Do not shave (including legs and underarms) for at least 48 hours prior to first CHG shower. It is OK to shave your face.  Please follow these instructions carefully.  Please read over the following fact sheets that you were given.

## 2023-09-26 NOTE — Progress Notes (Signed)
[place], [place], notified of UA results

## 2023-09-29 ENCOUNTER — Ambulatory Visit (HOSPITAL_COMMUNITY): Admission: RE | Admit: 2023-09-29 | Payer: Medicare Other | Source: Home / Self Care | Admitting: Orthopedic Surgery

## 2023-09-29 ENCOUNTER — Encounter (HOSPITAL_COMMUNITY): Admission: RE | Payer: Self-pay | Source: Home / Self Care

## 2023-09-29 DIAGNOSIS — Z01818 Encounter for other preprocedural examination: Secondary | ICD-10-CM

## 2023-09-29 SURGERY — REVERSE SHOULDER ARTHROPLASTY
Anesthesia: General | Site: Shoulder | Laterality: Right

## 2023-10-13 ENCOUNTER — Encounter: Payer: Medicare Other | Admitting: Orthopedic Surgery

## 2023-11-30 ENCOUNTER — Ambulatory Visit: Admitting: Orthopedic Surgery

## 2023-11-30 DIAGNOSIS — M19011 Primary osteoarthritis, right shoulder: Secondary | ICD-10-CM

## 2023-12-02 ENCOUNTER — Encounter: Payer: Self-pay | Admitting: Orthopedic Surgery

## 2023-12-02 NOTE — Progress Notes (Signed)
 Office Visit Note   Patient: Meghan [name]           Date of Birth: 1956-04-30           MRN: [id number] Visit Date: 11/30/2023 Requested by: Orlena Bitters, MD [address] [place],  [place] [address] PCP: Orlena Bitters, MD  Subjective: Chief Complaint  Patient presents with   Right Shoulder - Pain    HPI: Meghan [name] is a 68 y.o. female who presents to the office reporting right shoulder pain.  Patient scheduled for reverse shoulder replacement on 12/22/2023.  Hemoglobin A1c 2 months ago was 6.2.  She has had a CT scan for patient specific instrumentation late last year..                ROS: All systems reviewed are negative as they relate to the chief complaint within the history of present illness.  Patient denies fevers or chills.  Assessment & Plan: Visit Diagnoses: No diagnosis found.  Plan: Impression is right shoulder arthritis and rotator cuff arthropathy.  Patient has had surgery canceled on several occasions because of scratches on her right arm as well as snow.  Son lives nearby but she is at home by herself most of the day.  We had a talk about staying in the hospital more than 1 night possibly 2 nights versus going to skilled nursing.  In general I think she would be fairly functional with the shoulder after several days.  The risk and benefits of reverse shoulder replacement are discussed with Bynum Cassis and her son and they include but not limited to infection nerve vessel damage instability along with incomplete pain relief and incomplete restoration of function.  Patient understands the risk and benefits and wishes to proceed.  All questions answered.  Follow-Up Instructions: No follow-ups on file.   Orders:  No orders of the defined types were placed in this encounter.  No orders of the defined types were placed in this encounter.     Procedures: No procedures performed   Clinical Data: No additional findings.  Objective: Vital Signs: There  were no vitals taken for this visit.  Physical Exam:  Constitutional: Patient appears well-developed HEENT:  Head: Normocephalic Eyes:EOM are normal Neck: Normal range of motion Cardiovascular: Normal rate Pulmonary/chest: Effort normal Neurologic: Patient is alert Skin: Skin is warm Psychiatric: Patient has normal mood and affect  Ortho Exam:  Ortho exam demonstrates forward flexion abduction about 40 degrees on the right-hand side. Deltoid fires. Motor or sensory function to the hand is intact but she does report episodic paresthesias. Negative Tinel's on the right elbow at the ulnar nerve. Radial pulses intact. Passive range of motion is about 90 degrees of abduction and forward flexion. Actively it is about half that. She does have tenderness around the shoulder joint as well as in the bicipital groove with no Popeye deformity.   Specialty Comments:  No specialty comments available.  Imaging: No results found.   PMFS History: Patient Active Problem List   Diagnosis Date Noted   Osteomyelitis of third toe of left foot (HCC)    History of gout 06/30/2012   DM (diabetes mellitus), type 2, uncontrolled, periph vascular complic 06/30/2012   HTN (hypertension) 06/30/2012   Cellulitis of leg 06/30/2012   ARF (acute renal failure) (HCC) 06/30/2012   Past Medical History:  Diagnosis Date   Anemia 2020   iron infusions   Anxiety    Arthritis    Cellulitis 06/30/2012  RLE   Depression    Diabetic peripheral neuropathy (HCC)    GERD (gastroesophageal reflux disease)    Headache    High cholesterol    Hypertension    Seasonal allergies    Type II diabetes mellitus (HCC)    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    Family History  Adopted: Yes  Problem Relation Age of Onset   Dementia Mother    Heart attack Father     Past Surgical History:  Procedure Laterality Date   AMPUTATION  ~ 2008   "2nd toe of right foot" (06/30/2012)   AMPUTATION  07/04/2012   Procedure:  AMPUTATION RAY;  Surgeon: Timothy Ford, MD;  Location: [facility] OR;  Service: Orthopedics;  Laterality: Right;  great toe  amp vs ray amp   AMPUTATION Left 02/13/2021   Procedure: LEFT FOOT 3RD AND 4TH RAY AMPUTATION;  Surgeon: Timothy Ford, MD;  Location: [facility] OR;  Service: Orthopedics;  Laterality: Left;   CARPAL TUNNEL RELEASE Right    04-25-2023 Dr. Rozelle [facility])   INCISION AND DRAINAGE OF WOUND  03/10/2007   "mediastinitis", left medial collarbone (06/30/2012)   LYMPHADENECTOMY  08/09/2005   "bilateral groins; had cancer vulva" (06/30/2012)   TUBAL LIGATION     VULVA SURGERY  08/09/2005   "took cancer off" (06/30/2012)   WISDOM TOOTH EXTRACTION     Social History   Occupational History   Not on file  Tobacco Use   Smoking status: Former    Current packs/day: 0.00    Types: Cigarettes    Quit date: 11/1998    Years since quitting: 25.0   Smokeless tobacco: Never   Tobacco comments:    06/30/2012 "quit cigarette smoking in 1999"  Vaping Use   Vaping status: Never Used  Substance and Sexual Activity   Alcohol use: Not Currently   Drug use: Yes    Frequency: 3.0 times per week    Types: Marijuana   Sexual activity: Not Currently    Birth control/protection: Post-menopausal

## 2023-12-13 NOTE — Progress Notes (Signed)
 Surgical Instructions   Your procedure is scheduled on Tuesday, May 20th, 2025. Report to [facility] Main Entrance "A" at 8:45 A.M., then check in with the Admitting office. Any questions or running late day of surgery: call [phone]  Questions prior to your surgery date: call [phone], Monday-Friday, 8am-4pm. If you experience any cold or flu symptoms such as cough, fever, chills, shortness of breath, etc. between now and your scheduled surgery, please notify us  at the above number.     Remember:  Do not eat after midnight the night before your surgery  You may drink clear liquids until 7:45 the morning of your surgery.   Clear liquids allowed are: Water, Non-Citrus Juices (without pulp), Carbonated Beverages, Clear Tea (no milk, honey, etc.), Black Coffee Only (NO MILK, CREAM OR POWDERED CREAMER of any kind), and Gatorade.  Patient Instructions  The night before surgery:  No food after midnight. ONLY clear liquids after midnight   The day of surgery (if you have diabetes): Drink ONE (1) 12 oz G2 given to you in your pre admission testing appointment by 7:45 the morning of surgery. Drink in one sitting. Do not sip.  This drink was given to you during your hospital  pre-op appointment visit.  Nothing else to drink after completing the  12 oz bottle of G2.         If you have questions, please contact your surgeon's office.     Take these medicines the morning of surgery with A SIP OF WATER: Cetirizine (Zyrtec) Fluoxetine  (Prozac ) Metoprolol  (Lopressor ) Pantoprazole  (Protonix ) Pregabalin (Lyrica)   May take these medicines IF NEEDED: None.     One week prior to surgery, STOP taking any Aspirin  (unless otherwise instructed by your surgeon) Aleve, Naproxen, Ibuprofen, Motrin, Advil, Goody's, BC's, all herbal medications, fish oil, and non-prescription vitamins.   WHAT DO I DO ABOUT MY DIABETES MEDICATION?   Dulaglutide should be held for 7 days prior to  surgery.  Your last dose should be on or before Monday, May 12th.   THE NIGHT BEFORE SURGERY, take _32_ units of  Insulin  Glargine (Lantus ) insulin .       THE MORNING OF SURGERY, take _32_ units of Insulin  Glargine (Lantus ) insulin .  The morning of surgery, if your CBG is greater than 220 mg/dL, take HALF of your Insulin  Lispro (Humalog).    HOW TO MANAGE YOUR DIABETES BEFORE AND AFTER SURGERY  Why is it important to control my blood sugar before and after surgery? Improving blood sugar levels before and after surgery helps healing and can limit problems. A way of improving blood sugar control is eating a healthy diet by:  Eating less sugar and carbohydrates  Increasing activity/exercise  Talking with your doctor about reaching your blood sugar goals High blood sugars (greater than 180 mg/dL) can raise your risk of infections and slow your recovery, so you will need to focus on controlling your diabetes during the weeks before surgery. Make sure that the doctor who takes care of your diabetes knows about your planned surgery including the date and location.  How do I manage my blood sugar before surgery? Check your blood sugar at least 4 times a day, starting 2 days before surgery, to make sure that the level is not too high or low.  Check your blood sugar the morning of your surgery when you wake up and every 2 hours until you get to the Short Stay unit.  If your blood sugar is less than 70 mg/dL,  you will need to treat for low blood sugar: Do not take insulin . Treat a low blood sugar (less than 70 mg/dL) with  cup of clear juice (cranberry or apple), 4 glucose tablets, OR glucose gel. Recheck blood sugar in 15 minutes after treatment (to make sure it is greater than 70 mg/dL). If your blood sugar is not greater than 70 mg/dL on recheck, call [phone] for further instructions. Report your blood sugar to the short stay nurse when you get to Short Stay.  If you are admitted to  the hospital after surgery: Your blood sugar will be checked by the staff and you will probably be given insulin  after surgery (instead of oral diabetes medicines) to make sure you have good blood sugar levels. The goal for blood sugar control after surgery is 80-180 mg/dL.                      Do NOT Smoke (Tobacco/Vaping) for 24 hours prior to your procedure.  If you use a CPAP at night, you may bring your mask/headgear for your overnight stay.   You will be asked to remove any contacts, glasses, piercing's, hearing aid's, dentures/partials prior to surgery. Please bring cases for these items if needed.    Patients discharged the day of surgery will not be allowed to drive home, and someone needs to stay with them for 24 hours.  SURGICAL WAITING ROOM VISITATION Patients may have no more than 2 support people in the waiting area - these visitors may rotate.   Pre-op nurse will coordinate an appropriate time for 1 ADULT support person, who may not rotate, to accompany patient in pre-op.  Children under the age of 67 must have an adult with them who is not the patient and must remain in the main waiting area with an adult.  If the patient needs to stay at the hospital during part of their recovery, the visitor guidelines for inpatient rooms apply.  Please refer to the [facility] website for the visitor guidelines for any additional information.   If you received a COVID test during your pre-op visit  it is requested that you wear a mask when out in public, stay away from anyone that may not be feeling well and notify your surgeon if you develop symptoms. If you have been in contact with anyone that has tested positive in the last 10 days please notify you surgeon.      Pre-operative 5 CHG Bathing Instructions   You can play a key role in reducing the risk of infection after surgery. Your skin needs to be as free of germs as possible. You can reduce the number of germs on your skin by  washing with CHG (chlorhexidine  gluconate) soap before surgery. CHG is an antiseptic soap that kills germs and continues to kill germs even after washing.   DO NOT use if you have an allergy to chlorhexidine /CHG or antibacterial soaps. If your skin becomes reddened or irritated, stop using the CHG and notify one of our RNs at [phone].   Please shower with the CHG soap starting 4 days before surgery using the following schedule:     Please keep in mind the following:  DO NOT shave, including legs and underarms, starting the day of your first shower.   You may shave your face at any point before/day of surgery.  Place clean sheets on your bed the day you start using CHG soap. Use a clean washcloth (not used  since being washed) for each shower. DO NOT sleep with pets once you start using the CHG.   CHG Shower Instructions:  Wash your face and private area with normal soap. If you choose to wash your hair, wash first with your normal shampoo.  After you use shampoo/soap, rinse your hair and body thoroughly to remove shampoo/soap residue.  Turn the water OFF and apply about 3 tablespoons (45 ml) of CHG soap to a CLEAN washcloth.  Apply CHG soap ONLY FROM YOUR NECK DOWN TO YOUR TOES (washing for 3-5 minutes)  DO NOT use CHG soap on face, private areas, open wounds, or sores.  Pay special attention to the area where your surgery is being performed.  If you are having back surgery, having someone wash your back for you may be helpful. Wait 2 minutes after CHG soap is applied, then you may rinse off the CHG soap.  Pat dry with a clean towel  Put on clean clothes/pajamas   If you choose to wear lotion, please use ONLY the CHG-compatible lotions that are listed below.  Additional instructions for the day of surgery: DO NOT APPLY any lotions, deodorants, cologne, or perfumes.   Do not bring valuables to the hospital. [facility] is not responsible for any belongings/valuables. Do not wear  nail polish, gel polish, artificial nails, or any other type of covering on natural nails (fingers and toes) Do not wear jewelry or makeup Put on clean/comfortable clothes.  Please brush your teeth.  Ask your nurse before applying any prescription medications to the skin.     CHG Compatible Lotions   Aveeno Moisturizing lotion  Cetaphil Moisturizing Cream  Cetaphil Moisturizing Lotion  Clairol Herbal Essence Moisturizing Lotion, Dry Skin  Clairol Herbal Essence Moisturizing Lotion, Extra Dry Skin  Clairol Herbal Essence Moisturizing Lotion, Normal Skin  Curel Age Defying Therapeutic Moisturizing Lotion with Alpha Hydroxy  Curel Extreme Care Body Lotion  Curel Soothing Hands Moisturizing Hand Lotion  Curel Therapeutic Moisturizing Cream, Fragrance-Free  Curel Therapeutic Moisturizing Lotion, Fragrance-Free  Curel Therapeutic Moisturizing Lotion, Original Formula  Eucerin Daily Replenishing Lotion  Eucerin Dry Skin Therapy Plus Alpha Hydroxy Crme  Eucerin Dry Skin Therapy Plus Alpha Hydroxy Lotion  Eucerin Original Crme  Eucerin Original Lotion  Eucerin Plus Crme Eucerin Plus Lotion  Eucerin TriLipid Replenishing Lotion  Keri Anti-Bacterial Hand Lotion  Keri Deep Conditioning Original Lotion Dry Skin Formula Softly Scented  Keri Deep Conditioning Original Lotion, Fragrance Free Sensitive Skin Formula  Keri Lotion Fast Absorbing Fragrance Free Sensitive Skin Formula  Keri Lotion Fast Absorbing Softly Scented Dry Skin Formula  Keri Original Lotion  Keri Skin Renewal Lotion Keri Silky Smooth Lotion  Keri Silky Smooth Sensitive Skin Lotion  Nivea Body Creamy Conditioning Oil  Nivea Body Extra Enriched Lotion  Nivea Body Original Lotion  Nivea Body Sheer Moisturizing Lotion Nivea Crme  Nivea Skin Firming Lotion  NutraDerm 30 Skin Lotion  NutraDerm Skin Lotion  NutraDerm Therapeutic Skin Cream  NutraDerm Therapeutic Skin Lotion  ProShield Protective Hand Cream  Provon  moisturizing lotion    [place]- Preparing for Total Shoulder Arthroplasty  Before surgery, you can play an important role. Because skin is not sterile, your skin needs to be as free of germs as possible. You can reduce the number of germs on your skin by using the following products.   Benzoyl Peroxide Gel  o Reduces the number of germs present on the skin  o Applied twice a day to shoulder area starting  two days before surgery   Chlorhexidine  Gluconate (CHG) Soap (instructions listed above on how to wash with CHG Soap)  o An antiseptic cleaner that kills germs and bonds with the skin to continue killing germs even after washing  o Used for showering the night before surgery and morning of surgery   ==================================================================  Please follow these instructions carefully:  BENZOYL PEROXIDE 5% GEL  Please do not use if you have an allergy to benzoyl peroxide. If your skin becomes reddened/irritated stop using the benzoyl peroxide.  Starting two days before surgery, apply as follows:  1. Apply benzoyl peroxide in the morning and at night. Apply after taking a shower. If you are not taking a shower clean entire shoulder front, back, and side along with the armpit with a clean wet washcloth.  2. Place a quarter-sized dollop on your SHOULDER and rub in thoroughly, making sure to cover the front, back, and side of your shoulder, along with the armpit.   2 Days prior to Surgery First Dose on _____________ Morning Second Dose on ______________ Night  Day Before Surgery First Dose on ______________ Morning Night before surgery wash (entire body except face and private areas) with CHG Soap THEN Second Dose on ____________ Night   Morning of Surgery  wash BODY AGAIN with CHG Soap   4. Do NOT apply benzoyl peroxide gel on the day of surgery   Please read over the following fact sheets that you were given.

## 2023-12-14 ENCOUNTER — Encounter (HOSPITAL_COMMUNITY): Payer: Self-pay

## 2023-12-14 ENCOUNTER — Other Ambulatory Visit: Payer: Self-pay

## 2023-12-14 ENCOUNTER — Encounter (HOSPITAL_COMMUNITY)
Admission: RE | Admit: 2023-12-14 | Discharge: 2023-12-14 | Disposition: A | Discharge status: Home / Self Care | Source: Ambulatory Visit | Attending: Orthopedic Surgery | Admitting: Orthopedic Surgery

## 2023-12-14 VITALS — BP 144/70 | HR 75 | Temp 98.4°F | Resp 17 | Ht 67.0 in | Wt 198.3 lb

## 2023-12-14 DIAGNOSIS — Z01818 Encounter for other preprocedural examination: Secondary | ICD-10-CM

## 2023-12-14 DIAGNOSIS — Z01812 Encounter for preprocedural laboratory examination: Secondary | ICD-10-CM | POA: Insufficient documentation

## 2023-12-14 LAB — URINALYSIS, W/ REFLEX TO CULTURE (INFECTION SUSPECTED)
Bacteria, UA: NONE SEEN
Bilirubin Urine: NEGATIVE
Glucose, UA: NEGATIVE mg/dL
Hgb urine dipstick: NEGATIVE
Ketones, ur: NEGATIVE mg/dL
Leukocytes,Ua: NEGATIVE
Nitrite: NEGATIVE
Protein, ur: NEGATIVE mg/dL
Specific Gravity, Urine: 1.015 (ref 1.005–1.030)
pH: 6 (ref 5.0–8.0)

## 2023-12-14 LAB — CBC
HCT: 40.3 % (ref 36.0–46.0)
Hemoglobin: 12.9 g/dL (ref 12.0–15.0)
MCH: 28.2 pg (ref 26.0–34.0)
MCHC: 32 g/dL (ref 30.0–36.0)
MCV: 88 fL (ref 80.0–100.0)
Platelets: 197 10*3/uL (ref 150–400)
RBC: 4.58 MIL/uL (ref 3.87–5.11)
RDW: 15.4 % (ref 11.5–15.5)
WBC: 10.6 10*3/uL — ABNORMAL HIGH (ref 4.0–10.5)
nRBC: 0 % (ref 0.0–0.2)

## 2023-12-14 LAB — SURGICAL PCR SCREEN
MRSA, PCR: NEGATIVE
Staphylococcus aureus: POSITIVE — AB

## 2023-12-14 LAB — BASIC METABOLIC PANEL WITH GFR
Anion gap: 11 (ref 5–15)
BUN: 42 mg/dL — ABNORMAL HIGH (ref 8–23)
CO2: 29 mmol/L (ref 22–32)
Calcium: 10.4 mg/dL — ABNORMAL HIGH (ref 8.9–10.3)
Chloride: 99 mmol/L (ref 98–111)
Creatinine, Ser: 1.42 mg/dL — ABNORMAL HIGH (ref 0.44–1.00)
GFR, Estimated: 41 mL/min — ABNORMAL LOW (ref >60)
Glucose, Bld: 151 mg/dL — ABNORMAL HIGH (ref 70–99)
Potassium: 4.2 mmol/L (ref 3.5–5.1)
Sodium: 139 mmol/L (ref 135–145)

## 2023-12-14 LAB — GLUCOSE, CAPILLARY: Glucose-Capillary: 169 mg/dL — ABNORMAL HIGH (ref 70–99)

## 2023-12-14 NOTE — Progress Notes (Incomplete)
 PCP - Dhruv Vyas,MD Cardiologist - denies  PPM/ICD - denies Device Orders -  Rep Notified -   Chest x-ray - na EKG - 01/06/23 Stress Test - denies ECHO - denies Cardiac Cath - denies  Sleep Study - denies CPAP -   Fasting Blood Sugar - 55-138 Checks Blood Sugar - has a Dexcom  Last dose of GLP1 agonist-  12/15/23 GLP1 instructions:Last dose of Trulicity should be on or before Monday May 12.  Blood Thinner Instructions:na Aspirin  Instructions:na  ERAS Protcol - clears until 0745 PRE-SURGERY Ensure or G2- G2  COVID TEST- na   Anesthesia review: no  Patient denies shortness of breath, fever, cough and chest pain at PAT appointment. Pt stated she had Hgb A1C drawn at her PCP's office within last month. She declined to have it drawn at PAT visit. Faxed request for A1C results to PCP office.   All instructions explained to the patient, with a verbal understanding of the material. Patient agrees to go over the instructions while at home for a better understanding.The opportunity to ask questions was provided.

## 2023-12-22 DIAGNOSIS — Z01818 Encounter for other preprocedural examination: Secondary | ICD-10-CM

## 2023-12-27 ENCOUNTER — Encounter (HOSPITAL_COMMUNITY): Payer: Self-pay | Admitting: Orthopedic Surgery

## 2023-12-27 ENCOUNTER — Encounter (HOSPITAL_COMMUNITY)
Admission: RE | Disposition: A | Discharge status: Home / Self Care | Payer: Self-pay | Source: Home / Self Care | Attending: Orthopedic Surgery

## 2023-12-27 ENCOUNTER — Other Ambulatory Visit: Payer: Self-pay

## 2023-12-27 ENCOUNTER — Ambulatory Visit (HOSPITAL_BASED_OUTPATIENT_CLINIC_OR_DEPARTMENT_OTHER)

## 2023-12-27 ENCOUNTER — Ambulatory Visit (HOSPITAL_COMMUNITY)

## 2023-12-27 ENCOUNTER — Observation Stay (HOSPITAL_COMMUNITY)

## 2023-12-27 ENCOUNTER — Observation Stay (HOSPITAL_COMMUNITY)
Admission: RE | Admit: 2023-12-27 | Discharge: 2023-12-28 | Disposition: A | Discharge status: Home / Self Care | Attending: Orthopedic Surgery | Admitting: Orthopedic Surgery

## 2023-12-27 DIAGNOSIS — E119 Type 2 diabetes mellitus without complications: Secondary | ICD-10-CM | POA: Diagnosis not present

## 2023-12-27 DIAGNOSIS — Z87891 Personal history of nicotine dependence: Secondary | ICD-10-CM

## 2023-12-27 DIAGNOSIS — I10 Essential (primary) hypertension: Secondary | ICD-10-CM | POA: Insufficient documentation

## 2023-12-27 DIAGNOSIS — Z8544 Personal history of malignant neoplasm of other female genital organs: Secondary | ICD-10-CM | POA: Insufficient documentation

## 2023-12-27 DIAGNOSIS — F418 Other specified anxiety disorders: Secondary | ICD-10-CM

## 2023-12-27 DIAGNOSIS — M12811 Other specific arthropathies, not elsewhere classified, right shoulder: Secondary | ICD-10-CM

## 2023-12-27 DIAGNOSIS — M19011 Primary osteoarthritis, right shoulder: Secondary | ICD-10-CM | POA: Insufficient documentation

## 2023-12-27 DIAGNOSIS — Z01818 Encounter for other preprocedural examination: Principal | ICD-10-CM

## 2023-12-27 DIAGNOSIS — M75101 Unspecified rotator cuff tear or rupture of right shoulder, not specified as traumatic: Secondary | ICD-10-CM | POA: Diagnosis present

## 2023-12-27 DIAGNOSIS — Z96611 Presence of right artificial shoulder joint: Secondary | ICD-10-CM

## 2023-12-27 HISTORY — PX: REVERSE SHOULDER ARTHROPLASTY: SHX5054

## 2023-12-27 HISTORY — PX: BICEPT TENODESIS: SHX5116

## 2023-12-27 LAB — GLUCOSE, CAPILLARY
Glucose-Capillary: 102 mg/dL — ABNORMAL HIGH (ref 70–99)
Glucose-Capillary: 131 mg/dL — ABNORMAL HIGH (ref 70–99)
Glucose-Capillary: 194 mg/dL — ABNORMAL HIGH (ref 70–99)
Glucose-Capillary: 373 mg/dL — ABNORMAL HIGH (ref 70–99)
Glucose-Capillary: 334 mg/dL — ABNORMAL HIGH (ref 70–99)

## 2023-12-27 SURGERY — ARTHROPLASTY, SHOULDER, TOTAL, REVERSE
Anesthesia: Regional | Site: Shoulder | Laterality: Right

## 2023-12-27 MED ORDER — LIDOCAINE 2% (20 MG/ML) 5 ML SYRINGE
INTRAMUSCULAR | Status: AC
  Filled 2023-12-27: qty 5

## 2023-12-27 MED ORDER — SUGAMMADEX SODIUM 200 MG/2ML IV SOLN
INTRAVENOUS | Status: DC | PRN
  Administered 2023-12-27: 200 mg via INTRAVENOUS

## 2023-12-27 MED ORDER — ROCURONIUM BROMIDE 10 MG/ML (PF) SYRINGE
PREFILLED_SYRINGE | INTRAVENOUS | Status: AC
  Filled 2023-12-27: qty 10

## 2023-12-27 MED ORDER — TRANEXAMIC ACID-NACL 1000-0.7 MG/100ML-% IV SOLN
1000.0000 mg | INTRAVENOUS | Status: AC
  Administered 2023-12-27: 1000 mg via INTRAVENOUS
  Filled 2023-12-27: qty 100

## 2023-12-27 MED ORDER — PHENOL 1.4 % MT LIQD: 1.0000 | OROMUCOSAL | Status: DC | PRN

## 2023-12-27 MED ORDER — MIDAZOLAM HCL 2 MG/2ML IJ SOLN
INTRAMUSCULAR | Status: AC
  Filled 2023-12-27: qty 2

## 2023-12-27 MED ORDER — VANCOMYCIN HCL 1000 MG IV SOLR
INTRAVENOUS | Status: DC | PRN
  Administered 2023-12-27: 1000 mg via TOPICAL

## 2023-12-27 MED ORDER — CHLORHEXIDINE GLUCONATE 0.12 % MT SOLN
15.0000 mL | Freq: Once | OROMUCOSAL | Status: AC
  Administered 2023-12-27: 15 mL via OROMUCOSAL
  Filled 2023-12-27: qty 15

## 2023-12-27 MED ORDER — INSULIN ASPART 100 UNIT/ML IJ SOLN
0.0000 [IU] | Freq: Every day | INTRAMUSCULAR | Status: DC
  Administered 2023-12-27: 4 [IU] via SUBCUTANEOUS

## 2023-12-27 MED ORDER — MENTHOL 3 MG MT LOZG: 1.0000 | LOZENGE | OROMUCOSAL | Status: DC | PRN

## 2023-12-27 MED ORDER — ORAL CARE MOUTH RINSE: 15.0000 mL | Freq: Once | OROMUCOSAL | Status: AC

## 2023-12-27 MED ORDER — PROPOFOL 10 MG/ML IV BOLUS
INTRAVENOUS | Status: DC | PRN
  Administered 2023-12-27: 150 mg via INTRAVENOUS
  Administered 2023-12-27: 30 mg via INTRAVENOUS

## 2023-12-27 MED ORDER — OXYCODONE HCL 5 MG PO TABS
5.0000 mg | ORAL_TABLET | ORAL | Status: DC | PRN
  Administered 2023-12-27: 5 mg via ORAL
  Filled 2023-12-27: qty 1

## 2023-12-27 MED ORDER — DEXAMETHASONE SODIUM PHOSPHATE 10 MG/ML IJ SOLN
INTRAMUSCULAR | Status: AC
  Filled 2023-12-27: qty 1

## 2023-12-27 MED ORDER — CELECOXIB 100 MG PO CAPS
100.0000 mg | ORAL_CAPSULE | Freq: Two times a day (BID) | ORAL | Status: DC
  Administered 2023-12-27 – 2023-12-28 (×3): 100 mg via ORAL
  Filled 2023-12-27 (×3): qty 1

## 2023-12-27 MED ORDER — IRRISEPT - 450ML BOTTLE WITH 0.05% CHG IN STERILE WATER, USP 99.95% OPTIME
TOPICAL | Status: DC | PRN
  Administered 2023-12-27: 450 mL

## 2023-12-27 MED ORDER — FENTANYL CITRATE (PF) 100 MCG/2ML IJ SOLN: 25.0000 ug | INTRAMUSCULAR | Status: DC | PRN

## 2023-12-27 MED ORDER — LIDOCAINE 2% (20 MG/ML) 5 ML SYRINGE
INTRAMUSCULAR | Status: DC | PRN
  Administered 2023-12-27: 60 mg via INTRAVENOUS

## 2023-12-27 MED ORDER — LACTATED RINGERS IV SOLN: INTRAVENOUS | Status: DC

## 2023-12-27 MED ORDER — PANTOPRAZOLE SODIUM 40 MG PO TBEC
40.0000 mg | DELAYED_RELEASE_TABLET | Freq: Every morning | ORAL | Status: DC
  Administered 2023-12-28: 40 mg via ORAL
  Filled 2023-12-27: qty 1

## 2023-12-27 MED ORDER — BUPIVACAINE-EPINEPHRINE (PF) 0.5% -1:200000 IJ SOLN
INTRAMUSCULAR | Status: DC | PRN
  Administered 2023-12-27: 20 mL via PERINEURAL

## 2023-12-27 MED ORDER — MIDAZOLAM HCL 2 MG/2ML IJ SOLN
INTRAMUSCULAR | Status: DC | PRN
  Administered 2023-12-27: 2 mg via INTRAVENOUS

## 2023-12-27 MED ORDER — POVIDONE-IODINE 10 % EX SWAB
2.0000 | Freq: Once | CUTANEOUS | Status: AC
  Administered 2023-12-27: 2 via TOPICAL

## 2023-12-27 MED ORDER — ACETAMINOPHEN 500 MG PO TABS
1000.0000 mg | ORAL_TABLET | Freq: Four times a day (QID) | ORAL | Status: AC
  Administered 2023-12-27 – 2023-12-28 (×4): 1000 mg via ORAL
  Filled 2023-12-27 (×4): qty 2

## 2023-12-27 MED ORDER — VANCOMYCIN HCL 1000 MG IV SOLR
INTRAVENOUS | Status: AC
  Filled 2023-12-27: qty 20

## 2023-12-27 MED ORDER — METHOCARBAMOL 500 MG PO TABS
500.0000 mg | ORAL_TABLET | Freq: Four times a day (QID) | ORAL | Status: DC | PRN
  Administered 2023-12-27: 500 mg via ORAL
  Filled 2023-12-27: qty 1

## 2023-12-27 MED ORDER — PHENYLEPHRINE 80 MCG/ML (10ML) SYRINGE FOR IV PUSH (FOR BLOOD PRESSURE SUPPORT)
PREFILLED_SYRINGE | INTRAVENOUS | Status: DC | PRN
  Administered 2023-12-27 (×2): 80 ug via INTRAVENOUS

## 2023-12-27 MED ORDER — METHOCARBAMOL 1000 MG/10ML IJ SOLN: 500.0000 mg | Freq: Four times a day (QID) | INTRAMUSCULAR | Status: DC | PRN

## 2023-12-27 MED ORDER — ASPIRIN 81 MG PO TBEC
81.0000 mg | DELAYED_RELEASE_TABLET | Freq: Every day | ORAL | Status: DC
Start: 2023-12-28 — End: 2023-12-28
  Administered 2023-12-28: 81 mg via ORAL
  Filled 2023-12-27: qty 1

## 2023-12-27 MED ORDER — OXYCODONE HCL 5 MG PO TABS: 5.0000 mg | ORAL_TABLET | Freq: Once | ORAL | Status: DC | PRN

## 2023-12-27 MED ORDER — INSULIN ASPART 100 UNIT/ML IJ SOLN
4.0000 [IU] | Freq: Three times a day (TID) | INTRAMUSCULAR | Status: DC
  Administered 2023-12-27 – 2023-12-28 (×3): 4 [IU] via SUBCUTANEOUS

## 2023-12-27 MED ORDER — METOCLOPRAMIDE HCL 5 MG PO TABS: 5.0000 mg | ORAL_TABLET | Freq: Three times a day (TID) | ORAL | Status: DC | PRN

## 2023-12-27 MED ORDER — METOCLOPRAMIDE HCL 5 MG/ML IJ SOLN: 5.0000 mg | Freq: Three times a day (TID) | INTRAMUSCULAR | Status: DC | PRN

## 2023-12-27 MED ORDER — INSULIN ASPART 100 UNIT/ML IJ SOLN
0.0000 [IU] | Freq: Three times a day (TID) | INTRAMUSCULAR | Status: DC
  Administered 2023-12-27: 15 [IU] via SUBCUTANEOUS
  Administered 2023-12-27: 3 [IU] via SUBCUTANEOUS

## 2023-12-27 MED ORDER — ONDANSETRON HCL 4 MG PO TABS: 4.0000 mg | ORAL_TABLET | Freq: Four times a day (QID) | ORAL | Status: DC | PRN

## 2023-12-27 MED ORDER — OXYCODONE HCL 5 MG/5ML PO SOLN: 5.0000 mg | Freq: Once | ORAL | Status: DC | PRN

## 2023-12-27 MED ORDER — FLUOXETINE HCL 20 MG PO CAPS
40.0000 mg | ORAL_CAPSULE | Freq: Every morning | ORAL | Status: DC
  Administered 2023-12-28: 40 mg via ORAL
  Filled 2023-12-27: qty 2

## 2023-12-27 MED ORDER — HYDROMORPHONE HCL 1 MG/ML IJ SOLN: 0.5000 mg | INTRAMUSCULAR | Status: DC | PRN

## 2023-12-27 MED ORDER — INSULIN GLARGINE-YFGN 100 UNIT/ML ~~LOC~~ SOLN
65.0000 [IU] | Freq: Every day | SUBCUTANEOUS | Status: DC
  Administered 2023-12-27: 65 [IU] via SUBCUTANEOUS
  Filled 2023-12-27 (×2): qty 0.65

## 2023-12-27 MED ORDER — PHENYLEPHRINE HCL-NACL 20-0.9 MG/250ML-% IV SOLN
INTRAVENOUS | Status: DC | PRN
  Administered 2023-12-27: 30 ug/min via INTRAVENOUS

## 2023-12-27 MED ORDER — PREGABALIN 50 MG PO CAPS
50.0000 mg | ORAL_CAPSULE | Freq: Three times a day (TID) | ORAL | Status: DC
  Administered 2023-12-27 (×2): 50 mg via ORAL
  Filled 2023-12-27 (×2): qty 1

## 2023-12-27 MED ORDER — FENTANYL CITRATE (PF) 250 MCG/5ML IJ SOLN
INTRAMUSCULAR | Status: DC | PRN
  Administered 2023-12-27: 50 ug via INTRAVENOUS
  Administered 2023-12-27: 100 ug via INTRAVENOUS

## 2023-12-27 MED ORDER — FENTANYL CITRATE (PF) 250 MCG/5ML IJ SOLN
INTRAMUSCULAR | Status: AC
  Filled 2023-12-27: qty 5

## 2023-12-27 MED ORDER — BUPIVACAINE LIPOSOME 1.3 % IJ SUSP
INTRAMUSCULAR | Status: DC | PRN
  Administered 2023-12-27: 10 mL via PERINEURAL

## 2023-12-27 MED ORDER — PROPOFOL 10 MG/ML IV BOLUS
INTRAVENOUS | Status: AC
  Filled 2023-12-27: qty 20

## 2023-12-27 MED ORDER — METOPROLOL TARTRATE 50 MG PO TABS: 50.0000 mg | ORAL_TABLET | Freq: Every morning | ORAL | Status: DC

## 2023-12-27 MED ORDER — CEFAZOLIN SODIUM-DEXTROSE 2-4 GM/100ML-% IV SOLN
2.0000 g | INTRAVENOUS | Status: AC
  Administered 2023-12-27: 2 g via INTRAVENOUS
  Filled 2023-12-27: qty 100

## 2023-12-27 MED ORDER — ONDANSETRON HCL 4 MG/2ML IJ SOLN
INTRAMUSCULAR | Status: AC
  Filled 2023-12-27: qty 2

## 2023-12-27 MED ORDER — 0.9 % SODIUM CHLORIDE (POUR BTL) OPTIME
TOPICAL | Status: DC | PRN
  Administered 2023-12-27 (×4): 1000 mL

## 2023-12-27 MED ORDER — ACETAMINOPHEN 325 MG PO TABS: 325.0000 mg | ORAL_TABLET | Freq: Four times a day (QID) | ORAL | Status: DC | PRN

## 2023-12-27 MED ORDER — DOCUSATE SODIUM 100 MG PO CAPS
100.0000 mg | ORAL_CAPSULE | Freq: Two times a day (BID) | ORAL | Status: DC
  Administered 2023-12-27 – 2023-12-28 (×3): 100 mg via ORAL
  Filled 2023-12-27 (×3): qty 1

## 2023-12-27 MED ORDER — ROCURONIUM BROMIDE 10 MG/ML (PF) SYRINGE
PREFILLED_SYRINGE | INTRAVENOUS | Status: DC | PRN
  Administered 2023-12-27: 50 mg via INTRAVENOUS

## 2023-12-27 MED ORDER — POVIDONE-IODINE 7.5 % EX SOLN: Freq: Once | CUTANEOUS | Status: DC

## 2023-12-27 MED ORDER — ACETAMINOPHEN 10 MG/ML IV SOLN: 1000.0000 mg | Freq: Once | INTRAVENOUS | Status: DC | PRN

## 2023-12-27 MED ORDER — DEXAMETHASONE SODIUM PHOSPHATE 10 MG/ML IJ SOLN
INTRAMUSCULAR | Status: DC | PRN
  Administered 2023-12-27: 5 mg via INTRAVENOUS

## 2023-12-27 MED ORDER — INSULIN ASPART 100 UNIT/ML IJ SOLN: 0.0000 [IU] | Freq: Three times a day (TID) | INTRAMUSCULAR | Status: DC

## 2023-12-27 MED ORDER — CEFAZOLIN SODIUM-DEXTROSE 2-4 GM/100ML-% IV SOLN
2.0000 g | Freq: Three times a day (TID) | INTRAVENOUS | Status: AC
  Administered 2023-12-27 – 2023-12-28 (×3): 2 g via INTRAVENOUS
  Filled 2023-12-27 (×3): qty 100

## 2023-12-27 MED ORDER — ONDANSETRON HCL 4 MG/2ML IJ SOLN
INTRAMUSCULAR | Status: DC | PRN
  Administered 2023-12-27: 4 mg via INTRAVENOUS

## 2023-12-27 SURGICAL SUPPLY — 60 items
ALCOHOL 70% 16 OZ (MISCELLANEOUS) ×1 IMPLANT
AUGMENT COMP REV MI TAPR ADPTR (Joint) IMPLANT
BAG COUNTER SPONGE SURGICOUNT (BAG) ×1 IMPLANT
BEARING HUMERAL SHLDER 36M STD (Shoulder) IMPLANT
BIT DRILL 2.7 W/STOP DISP (BIT) IMPLANT
BIT DRILL TWIST 2.7 (BIT) IMPLANT
BLADE SAW SGTL 13X75X1.27 (BLADE) ×1 IMPLANT
CHLORAPREP W/TINT 26 (MISCELLANEOUS) ×1 IMPLANT
CLSR STERI-STRIP ANTIMIC 1/2X4 (GAUZE/BANDAGES/DRESSINGS) IMPLANT
COOLER ICEMAN CLASSIC (MISCELLANEOUS) ×1 IMPLANT
COVER SURGICAL LIGHT HANDLE (MISCELLANEOUS) ×1 IMPLANT
DRAPE INCISE IOBAN 66X45 STRL (DRAPES) ×1 IMPLANT
DRAPE U-SHAPE 47X51 STRL (DRAPES) ×2 IMPLANT
DRESSING AQUACEL AG SP 3.5X10 (GAUZE/BANDAGES/DRESSINGS) IMPLANT
DRSG AQUACEL AG ADV 3.5X10 (GAUZE/BANDAGES/DRESSINGS) ×1 IMPLANT
ELECTRODE BLDE 4.0 EZ CLN MEGD (MISCELLANEOUS) ×1 IMPLANT
ELECTRODE REM PT RTRN 9FT ADLT (ELECTROSURGICAL) ×1 IMPLANT
GAUZE SPONGE 4X4 12PLY STRL LF (GAUZE/BANDAGES/DRESSINGS) ×1 IMPLANT
GLENOID SPHERE 36MM CVD +3 (Orthopedic Implant) IMPLANT
GLOVE BIOGEL PI IND STRL 6.5 (GLOVE) ×1 IMPLANT
GLOVE BIOGEL PI IND STRL 8 (GLOVE) ×1 IMPLANT
GLOVE ECLIPSE 6.5 STRL STRAW (GLOVE) ×1 IMPLANT
GLOVE ECLIPSE 8.0 STRL XLNG CF (GLOVE) ×1 IMPLANT
GOWN STRL REUS W/ TWL LRG LVL3 (GOWN DISPOSABLE) ×1 IMPLANT
GOWN STRL REUS W/ TWL XL LVL3 (GOWN DISPOSABLE) ×1 IMPLANT
GUIDE BONE RSA SHLD ROT RT (ORTHOPEDIC DISPOSABLE SUPPLIES) IMPLANT
HYDROGEN PEROXIDE 16OZ (MISCELLANEOUS) ×1 IMPLANT
KIT BASIN OR (CUSTOM PROCEDURE TRAY) ×1 IMPLANT
KIT TURNOVER KIT B (KITS) ×1 IMPLANT
LAVAGE JET IRRISEPT WOUND (IRRIGATION / IRRIGATOR) ×1 IMPLANT
MANIFOLD NEPTUNE II (INSTRUMENTS) ×1 IMPLANT
NDL SUT 6 .5 CRC .975X.05 MAYO (NEEDLE) IMPLANT
NS IRRIG 1000ML POUR BTL (IV SOLUTION) ×1 IMPLANT
PACK SHOULDER (CUSTOM PROCEDURE TRAY) ×1 IMPLANT
PAD COLD SHLDR WRAP-ON (PAD) ×1 IMPLANT
PIN STEINMANN THREADED TIP (PIN) IMPLANT
PIN THREADED REVERSE (PIN) IMPLANT
REAMER GUIDE BUSHING SURG DISP (MISCELLANEOUS) IMPLANT
REAMER GUIDE W/SCREW AUG (MISCELLANEOUS) IMPLANT
RESTRAINT HEAD UNIVERSAL NS (MISCELLANEOUS) ×1 IMPLANT
RETRIEVER SUT HEWSON (MISCELLANEOUS) ×1 IMPLANT
SCREW BONE STRL 6.5MMX30MM (Screw) IMPLANT
SCREW LOCKING 4.75MMX15MM (Screw) IMPLANT
SCREW LOCKING NS 4.75MMX20MM (Screw) IMPLANT
SLING ARM IMMOBILIZER LRG (SOFTGOODS) ×1 IMPLANT
SOL PREP POV-IOD 4OZ 10% (MISCELLANEOUS) ×1 IMPLANT
SPONGE T-LAP 18X18 ~~LOC~~+RFID (SPONGE) ×1 IMPLANT
STEM HUMERAL STRL 9MMX83MM (Stem) IMPLANT
STRIP CLOSURE SKIN 1/2X4 (GAUZE/BANDAGES/DRESSINGS) ×1 IMPLANT
SUCTION TUBE FRAZIER 10FR DISP (SUCTIONS) ×1 IMPLANT
SUT ETHILON 3 0 PS 1 (SUTURE) IMPLANT
SUT MNCRL AB 3-0 PS2 18 (SUTURE) ×1 IMPLANT
SUT SILK 2 0 TIES 10X30 (SUTURE) ×1 IMPLANT
SUT VIC AB 0 CT1 27XBRD ANBCTR (SUTURE) ×4 IMPLANT
SUT VIC AB 1 CT1 27XBRD ANBCTR (SUTURE) ×2 IMPLANT
SUT VIC AB 2-0 CT2 27 (SUTURE) ×3 IMPLANT
SUT VICRYL 0 UR6 27IN ABS (SUTURE) ×2 IMPLANT
TOWEL GREEN STERILE (TOWEL DISPOSABLE) ×1 IMPLANT
TRAY HUM REV SHOULDER STD +6 (Shoulder) IMPLANT
WATER STERILE IRR 1000ML POUR (IV SOLUTION) ×1 IMPLANT

## 2023-12-27 NOTE — H&P (Signed)
 Meghan [name] is an 68 y.o. female.   Chief Complaint: right shoulder pain  HPI:  Meghan [name] is a 68 year old patient with long history of right shoulder pain and disability from arthropathy.  She has failed nonoperative treatment.  Has end-stage arthritis as well as rotator cuff arthropathy in the right shoulder.  Patient describes pain with almost all activities of daily living as well as pain which wakes her from sleep at night.  She has several medical comorbidities which have been optimized prior to surgery. Hemoglobin A1c 2 months ago was 6.2.  She has had a CT scan for patient specific instrumentation late last year..  Past Medical History:  Diagnosis Date   Anemia 2020   iron infusions   Anxiety    Arthritis    Cellulitis 06/30/2012   RLE   Depression    Diabetic peripheral neuropathy (HCC)    GERD (gastroesophageal reflux disease)    Headache    High cholesterol    Hypertension    Seasonal allergies    Type II diabetes mellitus (HCC)    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    Past Surgical History:  Procedure Laterality Date   AMPUTATION  ~ 2008   "2nd toe of right foot" (06/30/2012)   AMPUTATION  07/04/2012   Procedure: AMPUTATION RAY;  Surgeon: Timothy Ford, MD;  Location: [facility] OR;  Service: Orthopedics;  Laterality: Right;  great toe  amp vs ray amp   AMPUTATION Left 02/13/2021   Procedure: LEFT FOOT 3RD AND 4TH RAY AMPUTATION;  Surgeon: Timothy Ford, MD;  Location: [facility] OR;  Service: Orthopedics;  Laterality: Left;   CARPAL TUNNEL RELEASE Right    04-25-2023 Dr. Rozelle Corning [facility])   INCISION AND DRAINAGE OF WOUND  03/10/2007   "mediastinitis", left medial collarbone (06/30/2012)   LYMPHADENECTOMY  08/09/2005   "bilateral groins; had cancer vulva" (06/30/2012)   TUBAL LIGATION     VULVA SURGERY  08/09/2005   "took cancer off" (06/30/2012)   WISDOM TOOTH EXTRACTION      Family History  Adopted: Yes  Problem Relation Age of Onset   Dementia Mother    Heart  attack Father    Social History:  reports that she quit smoking about 25 years ago. Her smoking use included cigarettes. She has never used smokeless tobacco. She reports that she does not currently use alcohol. She reports current drug use. Frequency: 3.00 times per week. Drug: Marijuana.  Allergies: No Known Allergies  No medications prior to admission.    No results found for this or any previous visit (from the past 48 hours). No results found.  Review of Systems  Musculoskeletal:  Positive for arthralgias.  All other systems reviewed and are negative.   There were no vitals taken for this visit. Physical Exam Vitals reviewed.  HENT:     Head: Normocephalic.     Nose: Nose normal.     Mouth/Throat:     Mouth: Mucous membranes are moist.  Eyes:     Pupils: Pupils are equal, round, and reactive to light.  Cardiovascular:     Rate and Rhythm: Normal rate.     Pulses: Normal pulses.  Pulmonary:     Effort: Pulmonary effort is normal.  Abdominal:     General: Abdomen is flat.  Musculoskeletal:     Cervical back: Normal range of motion.  Skin:    General: Skin is warm.     Capillary Refill: Capillary refill takes less than 2 seconds.  Neurological:  General: No focal deficit present.     Mental Status: She is alert.  Psychiatric:        Mood and Affect: Mood normal.    Ortho exam demonstrates forward flexion abduction about 40 degrees on the right-hand side. Deltoid fires. Motor or sensory function to the hand is intact but she does report episodic paresthesias. Negative Tinel's on the right elbow at the ulnar nerve. Radial pulses intact. Passive range of motion is about 90 degrees of abduction and forward flexion. Actively it is about half that. She does have tenderness around the shoulder joint as well as in the bicipital groove with no Popeye deformity.   Assessment/Plan Impression is right shoulder arthritis and rotator cuff arthropathy. Patient has had surgery  canceled on several occasions because of scratches on her right arm as well as snow. Son lives nearby but she is at home by herself most of the day. We had a talk about staying in the hospital more than 1 night possibly 2 nights versus going to skilled nursing. In general I think she would be fairly functional with the shoulder after several days. The risk and benefits of reverse shoulder replacement are discussed with Meghan [name] and her son and they include but not limited to infection nerve vessel damage instability along with incomplete pain relief and incomplete restoration of function. Patient understands the risk and benefits and wishes to proceed. All questions answered.   Marykay Snipes, MD 12/27/2023, 6:53 AM

## 2023-12-27 NOTE — Anesthesia Procedure Notes (Signed)
 Procedure Name: Intubation Date/Time: 12/27/2023 8:16 AM  Performed by: Garyn Arlotta C, CRNAPre-anesthesia Checklist: Patient identified, Emergency Drugs available, Suction available and Patient being monitored Patient Re-evaluated:Patient Re-evaluated prior to induction Oxygen Delivery Method: Circle system utilized Preoxygenation: Pre-oxygenation with 100% oxygen Induction Type: IV induction Ventilation: Mask ventilation without difficulty Laryngoscope Size: Mac and 3 Grade View: Grade I Tube type: Oral Tube size: 7.0 mm Number of attempts: 1 Airway Equipment and Method: Stylet and Oral airway Placement Confirmation: ETT inserted through vocal cords under direct vision, positive ETCO2 and breath sounds checked- equal and bilateral Secured at: 21 cm Tube secured with: Tape Dental Injury: Teeth and Oropharynx as per pre-operative assessment

## 2023-12-27 NOTE — Anesthesia Procedure Notes (Signed)
 Anesthesia Regional Block: Interscalene brachial plexus block   Pre-Anesthetic Checklist: , timeout performed,  Correct Patient, Correct Site, Correct Laterality,  Correct Procedure, Correct Position, site marked,  Risks and benefits discussed,  Surgical consent,  Pre-op evaluation,  At surgeon's request and post-op pain management  Laterality: Right and Upper  Prep: chloraprep       Needles:  Injection technique: Single-shot      Needle Length: 5cm  Needle Gauge: 22     Additional Needles: Arrow StimuQuik ECHO Echogenic Stimulating PNB Needle  Procedures:,,,, ultrasound used (permanent image in chart),,    Narrative:  Start time: 12/27/2023 7:56 AM End time: 12/27/2023 8:04 AM Injection made incrementally with aspirations every 5 mL.  Performed by: Personally  Anesthesiologist: Arvie Latus, MD

## 2023-12-27 NOTE — Op Note (Signed)
 NAME: Meghan [name], Meghan L. MEDICAL RECORD NO: [id number] ACCOUNT NO: [id number] DATE OF BIRTH: [birth year] FACILITY: [facility] LOCATION: [facility] PHYSICIAN: Gloriann Larger. Rozelle Corning, MD  Operative Report   DATE OF PROCEDURE: 12/27/2023  PREOPERATIVE DIAGNOSIS:  Right shoulder rotator cuff arthropathy and arthritis.  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  PROCEDURE:  Right reverse shoulder replacement using Biomet Comprehensive Reverse Shoulder System small augmented baseplate with 36 +3 glenosphere, mini humeral stem size 9 with standard thickness +6 tapered offset humeral tray 40 mm and 36-mm  cross-linked polyethylene standard liner.  SURGEON:  Gloriann Larger. Rozelle Corning, MD.  ASSISTANT:  Prentis Brock, PA.  INDICATIONS:  The patient is a 68 year old with severe rotator cuff arthropathy who presents for operative management after explanation of risks, and benefits.  DESCRIPTION OF PROCEDURE:  The patient was brought to the operating room where general anesthetic was induced. Preoperative antibiotics administered and a timeout was called.  The patient was placed in the beach chair position with the head in neutral  position.  The right shoulder, arm, and hand were prescrubbed with hydrogen peroxide followed by alcohol and Betadine , which was allowed to air dry and then ChloraPrep solution and then draped in a sterile manner.  Ioban used to cover the entire  operative field.  After calling timeout, a deltopectoral approach was made.  IrriSept solution was utilized.  Cephalic vein was not a discrete structure, but had several branches, some of which required ligation.  The deltoid was elevated off its  anterior attachment manually to decrease some of the tension on the anterior deltoid.  This was elevated as a sleeve.  Upper 1.5 cm of the pectoralis was released.  Biceps tendon tenodesed to the pec and surrounding tissue using 5-0 Vicryl sutures.  The  patient's axillary  nerve was then palpated and protected at all times during the case.  Subscapularis absent.  Cobell retractor placed.  Circumflex vessels ligated.  Soft tissue dissection performed as a sleeve off the inferior 2 cm of the humeral neck  around to the 5 o'clock position.  This gave very good mobilization of the humeral head.  The humeral head was then dislocated.  We did put some traction sutures on what was the remnant of the subscapularis and bursa in front of the shoulder.  Next, the  humeral head was reamed up to size 9 and then the humeral head was cut in 30 degrees of retroversion.  Broaching then performed up to a size 9, which gave a very nice fit.  Bone quality was marginal, but sufficient to give good stability to the trial  implant.  Cap was placed.  Then, we placed a posterior retractor and anterior retractor around the glenoid.  With the patient's muscle relaxation reversed, dissection was performed around the glenoid to remove the labrum.  Bankart lesion created from the  12 o'clock to 6 o'clock position on the right shoulder.  The patient's specific guide was then placed with confirmation of good placement of the guide pin.  Reaming was then performed.  Reaming for the augment was then performed.  Trial placement showed  very good contact.  The true implant was then placed with one central compression screw, which obtained very good bite as well as three peripheral locking screws.  The anterior locking screw was not going to be insufficient bone depth to warrant placing  it.  IrriSept solution was utilized at this time as well as at multiple times during the case.  We then  did trial reduction with a +3 36 glenosphere and a standard +6 offset humeral tray.  This gave very good stability.  Hard to dislocate and hard to  relocate.  "Two fingers tight."  The patient had very good stability to extension and adduction as well as a forward force.  Very good internal and external rotation at 90 degrees of  abduction.  Trial components were removed.  The true glenosphere was  placed onto the dried Morse taper and tapped into position.  Next, we placed the true stem after irrigating the canal with IrriSept solution and placing in some vancomycin  powder.  The stem fit nicely with very good fixation proximally.  Next, we did a  repeat trial reduction with the +6 tray and standard liner, which gave the same stability parameters.  The true tray was placed onto the dry Morse taper and the same stability parameters maintained.  Axillary nerve again palpated and found to be intact.   Thorough irrigation was then performed with 3 liters of pouring irrigation.  We then used IrriSept solution and then placed vancomycin  powder on the implant.  The deltopectoral interval was then closed using #1 Vicryl suture followed by interrupted  inverted 0 Vicryl suture, 2-0 Vicryl suture, and 3-0 Monocryl with Steri-Strips and Aquacel dressing and a shoulder immobilizer placed.  The patient tolerated the procedure well without immediate complications.  Luke's assistance was required at all  times for retraction, opening and closing mobilization of tissue.  His assistance was medical necessity.   PUS D: 12/27/2023 11:03:37 am T: 12/27/2023 12:16:00 pm  JOB: [id number]/ [id number]

## 2023-12-27 NOTE — Brief Op Note (Signed)
   12/27/2023  11:03 AM  PATIENT:  Meghan [name]  68 y.o. female  PRE-OPERATIVE DIAGNOSIS:  right shoulder rotator cuff arthropathy  POST-OPERATIVE DIAGNOSIS:  right shoulder rotator cuff arthropathy  PROCEDURE:  Procedure(s): REVERSE SHOULDER ARTHROPLASTY BICEPS TENODESIS  SURGEON:  Surgeon(s): Rozelle Corning, Maricela Shoe, MD  ASSISTANT: magnant pa  ANESTHESIA:   general  EBL: 50 ml    Total I/O In: 1100 [I.V.:1000; IV Piggyback:100] Out: -   BLOOD ADMINISTERED: none  DRAINS: none   LOCAL MEDICATIONS USED:  vanco  SPECIMEN:  No Specimen  COUNTS:  YES  TOURNIQUET:  * No tourniquets in log *  DICTATION: .Other Dictation: Dictation Number [id number]  PLAN OF CARE: Admit for overnight observation  PATIENT DISPOSITION:  PACU - hemodynamically stable

## 2023-12-27 NOTE — Anesthesia Preprocedure Evaluation (Addendum)
 Anesthesia Evaluation  Patient identified by MRN, date of birth, ID band Patient awake    Reviewed: Allergy & Precautions, NPO status , Patient's Chart, lab work & pertinent test results  History of Anesthesia Complications Negative for: history of anesthetic complications  Airway Mallampati: II  TM Distance: >3 FB Neck ROM: Full    Dental  (+) Edentulous Upper, Edentulous Lower, Dental Advisory Given   Pulmonary neg shortness of breath, neg sleep apnea, neg COPD, neg recent URI, former smoker   breath sounds clear to auscultation       Cardiovascular hypertension, Pt. on medications and Pt. on home beta blockers (-) angina + Peripheral Vascular Disease  (-) Past MI  Rhythm:Regular     Neuro/Psych  Headaches PSYCHIATRIC DISORDERS Anxiety Depression     Neuromuscular disease    GI/Hepatic Neg liver ROS,GERD  Medicated and Controlled,,  Endo/Other  diabetes, Type 2, Insulin  Dependent    Renal/GU CRFRenal diseaseLab Results      Component                Value               Date                      NA                       139                 12/14/2023                K                        4.2                 12/14/2023                CO2                      29                  12/14/2023                GLUCOSE                  151 (H)             12/14/2023                BUN                      42 (H)              12/14/2023                CREATININE               1.42 (H)            12/14/2023                CALCIUM                  10.4 (H)            12/14/2023                GFRNONAA  41 (L)              12/14/2023                Musculoskeletal  (+) Arthritis ,    Abdominal   Peds  Hematology Lab Results      Component                Value               Date                      WBC                      10.6 (H)            12/14/2023                HGB                      12.9                 12/14/2023                HCT                      40.3                12/14/2023                MCV                      88.0                12/14/2023                PLT                      197                 12/14/2023              Anesthesia Other Findings Patient arrived late  Reproductive/Obstetrics                              Anesthesia Physical Anesthesia Plan  ASA: 3  Anesthesia Plan: General and Regional   Post-op Pain Management: Regional block*   Induction: Intravenous  PONV Risk Score and Plan: 3 and Ondansetron  and Dexamethasone   Airway Management Planned: Oral ETT  Additional Equipment: None  Intra-op Plan:   Post-operative Plan: Extubation in OR  Informed Consent: I have reviewed the patients History and Physical, chart, labs and discussed the procedure including the risks, benefits and alternatives for the proposed anesthesia with the patient or authorized representative who has indicated his/her understanding and acceptance.     Dental advisory given  Plan Discussed with: CRNA  Anesthesia Plan Comments:          Anesthesia Quick Evaluation

## 2023-12-27 NOTE — Transfer of Care (Signed)
 Immediate Anesthesia Transfer of Care Note  Patient: Meghan [name]  Procedure(s) Performed: REVERSE SHOULDER ARTHROPLASTY (Right: Shoulder) BICEPS TENODESIS (Right: Shoulder)  Patient Location: PACU  Anesthesia Type:General  Level of Consciousness: awake, alert , and oriented  Airway & Oxygen Therapy: Patient Spontanous Breathing and Patient connected to face mask oxygen  Post-op Assessment: Report given to RN and Post -op Vital signs reviewed and stable  Post vital signs: Reviewed and stable  Last Vitals:  Vitals Value Taken Time  BP 107/64 12/27/23 1116  Temp 36.6 C 12/27/23 1115  Pulse 63 12/27/23 1121  Resp 16 12/27/23 1121  SpO2 100 % 12/27/23 1121  Vitals shown include unfiled device data.  Last Pain:  Vitals:   12/27/23 0715  PainSc: 0-No pain         Complications: No notable events documented.

## 2023-12-28 ENCOUNTER — Encounter (HOSPITAL_COMMUNITY): Payer: Self-pay | Admitting: Orthopedic Surgery

## 2023-12-28 DIAGNOSIS — M75101 Unspecified rotator cuff tear or rupture of right shoulder, not specified as traumatic: Secondary | ICD-10-CM | POA: Diagnosis not present

## 2023-12-28 LAB — GLUCOSE, CAPILLARY: Glucose-Capillary: 118 mg/dL — ABNORMAL HIGH (ref 70–99)

## 2023-12-28 MED ORDER — METHOCARBAMOL 500 MG PO TABS: 500.0000 mg | ORAL_TABLET | Freq: Three times a day (TID) | ORAL | 0 refills | Status: AC | PRN

## 2023-12-28 MED ORDER — DOCUSATE SODIUM 100 MG PO CAPS: 100.0000 mg | ORAL_CAPSULE | Freq: Two times a day (BID) | ORAL | 0 refills | Status: AC

## 2023-12-28 MED ORDER — MUPIROCIN 2 % EX OINT
1.0000 | TOPICAL_OINTMENT | Freq: Two times a day (BID) | CUTANEOUS | Status: DC
  Administered 2023-12-28: 1 via NASAL
  Filled 2023-12-28: qty 22

## 2023-12-28 MED ORDER — ASPIRIN 81 MG PO TBEC: 81.0000 mg | DELAYED_RELEASE_TABLET | Freq: Every day | ORAL | 12 refills | Status: AC

## 2023-12-28 MED ORDER — CHLORHEXIDINE GLUCONATE CLOTH 2 % EX PADS
6.0000 | MEDICATED_PAD | Freq: Every day | CUTANEOUS | Status: DC
  Administered 2023-12-28: 6 via TOPICAL

## 2023-12-28 MED ORDER — OXYCODONE HCL 5 MG PO TABS: 5.0000 mg | ORAL_TABLET | ORAL | 0 refills | Status: AC | PRN

## 2023-12-28 NOTE — TOC Transition Note (Signed)
 Transition of Care [facility]) - Discharge Note   Patient Details  Name: Meghan [name] MRN: [id number] Date of Birth: [birth year]  [facility] of Care [facility]) CM/SW Contact:  Omie Bickers, RN Phone Number: 12/28/2023, 8:49 AM   Clinical Narrative:     Patient to DC home. Family to transport.  HH services set up with [facility], AVS updated. Unit to supply any DME needed for DC  Final next level of care: Home w Home Health Services Barriers to Discharge: No Barriers Identified   Patient Goals and CMS Choice Patient states their goals for this hospitalization and ongoing recovery are:: to go home CMS Medicare.gov Compare Post Acute Care list provided to:: Patient Choice offered to / list presented to : Patient      Discharge Placement                       Discharge Plan and Services Additional resources added to the After Visit Summary for                            [facility] Arranged: RN, Nurse's Aide [facility] Agency: [facility] Date [facility] Agency Contacted: 12/28/23 Time HH Agency Contacted: [phone] Representative spoke with at [facility] Agency: Randel Buss  Social Drivers of Health (SDOH) Interventions SDOH Screenings   Food Insecurity: No Food Insecurity (05/06/2023)   Received from [facility]  Transportation Needs: No Transportation Needs (05/06/2023)   Received from [facility]  Utilities: Low Risk  (05/06/2023)   Received from [facility]  Financial Resource Strain: Low Risk  (05/06/2023)   Received from [facility]  Physical Activity: Inactive (05/06/2023)   Received from [facility]  Social Connections: Moderately Integrated (05/06/2023)   Received from [facility]  Stress: No Stress Concern Present (05/06/2023)   Received from [facility]  Tobacco Use: Medium Risk (12/27/2023)  Health Literacy: Low Risk  (05/07/2023)   Received from [facility]     Readmission Risk Interventions     No data to display

## 2023-12-28 NOTE — Care Management Obs Status (Signed)
 MEDICARE OBSERVATION STATUS NOTIFICATION   Patient Details  Name: Meghan [name] MRN: [id number] Date of Birth: Jan 03, 1956   Medicare Observation Status Notification Given:  Yes  [place]oon/Obs letter signed and a copy given  Wynonia Hedges 12/28/2023, 10:47 AM

## 2023-12-28 NOTE — Progress Notes (Signed)
 Patient alert and oriented, void, ambulate, VSS.d/c instructions explain and given to the patient and son all questions answered. Surgical site clean and dry no sign of infection.

## 2023-12-28 NOTE — Anesthesia Postprocedure Evaluation (Signed)
 Anesthesia Post Note  Patient: Meghan [name]  Procedure(s) Performed: REVERSE SHOULDER ARTHROPLASTY (Right: Shoulder) BICEPS TENODESIS (Right: Shoulder)     Patient location during evaluation: PACU Anesthesia Type: Regional and General Level of consciousness: awake and alert Pain management: pain level controlled Vital Signs Assessment: post-procedure vital signs reviewed and stable Respiratory status: spontaneous breathing, nonlabored ventilation and respiratory function stable Cardiovascular status: blood pressure returned to baseline and stable Postop Assessment: no apparent nausea or vomiting Anesthetic complications: no   No notable events documented.                Effrey Davidow

## 2023-12-28 NOTE — Evaluation (Signed)
 Occupational Therapy Evaluation and Discharge Patient Details Name: Meghan [name] MRN: [id number] DOB: 1956/01/30 Today's Date: 12/28/2023   History of Present Illness   Pt is a 68 yo right reverse shoulder arthroplasty. PHMx: diabetic perpherial neuropathy, DM2, left toes amputation (3 and 4), right transmetarsal amputation     Clinical Impressions: PTA pt lives by herself with family and friends checking on her.  Education completed regarding compensatory strategies for ADL tasks and functional mobility, management of sling, AROM per specified parameters in the order set as indicated below, positioning of operative arm in sitting and supine and edema control, including use of "Iceman" Cold Therapy machine. Caregiver present for education, written handouts provided and reviewed using Teach Back and pt/caregiver verbalized/demonstrated understanding. Due to the below listed deficits, pt requires Independent-Max assistance with ADL tasks (mainly because block has not worn off for arm and indepedent assist with functional mobility. Caregiver will intermittently be able to provide necessary level of assistance at discharge. Pt to follow up with MD to progress rehab of the operative shoulder.      If plan is discharge home, recommend the following:   A little help with walking and/or transfers;A lot of help with bathing/dressing/bathroom;Assistance with cooking/housework;Assist for transportation     Functional Status Assessment   Patient has had a recent decline in their functional status and demonstrates the ability to make significant improvements in function in a reasonable and predictable amount of time. (without further need for skilled OT)     Equipment Recommendations   None recommended by OT      Precautions/Restrictions   Precautions Precautions: Shoulder Type of Shoulder Precautions: Sling At all times except ADL / exercise; Non weight bearing YES; AROM elbow,  wrist and hand to tolerance YES; OK for pendulums YES; External Rotation 0-30 ; AROM of shoulder NO Shoulder Interventions: Shoulder sling/immobilizer Precaution Booklet Issued: Yes (comment) Recall of Precautions/Restrictions: Intact Restrictions Weight Bearing Restrictions Per Provider Order: Yes RUE Weight Bearing Per Provider Order: Non weight bearing     Mobility Bed Mobility Overal bed mobility: Modified Independent                  Transfers Overall transfer level: Independent                        Balance Overall balance assessment: Mild deficits observed, not formally tested                                              Vision Patient Visual Report: No change from baseline              Pertinent Vitals/Pain Pain Assessment Pain Assessment: No/denies pain (block still working\)     Extremity/Trunk Assessment Upper Extremity Assessment Upper Extremity Assessment: LUE deficits/detail RUE Deficits / Details: shoulder sx, minimal AROM in fingers due to block still working RUE Coordination: decreased gross motor;decreased fine motor LUE Deficits / Details: Decreased AROM at shoulder LUE Coordination: decreased gross motor           Communication Communication Communication: No apparent difficulties   Cognition Arousal: Alert Behavior During Therapy: WFL for tasks assessed/performed Cognition: No apparent impairments  Following commands: Intact       Cueing   Cueing Techniques: Verbal cues         Shoulder Instructions Shoulder Instructions Donning/doffing shirt without moving shoulder: Maximal assistance (for pull over shirt due to arm not awake and limited AROM at right shoulder as well. We spoke about a button up shirt would probably work better.) Method for sponge bathing under operated UE: Modified independent Donning/doffing sling/immobilizer: Maximal assistance  (due to arm still with block, I did demonstrate to her and son how she would put the sling back on once arm is awake; by laying sling on table) Correct positioning of sling/immobilizer:  (verbalized understanding) Pendulum exercises (written home exercise program):  (verbalized understanding once I demonstrated to her and son video tapped me doing them) ROM for elbow, wrist and digits of operated UE:  (verbalized understanding since block not worn off) Sling wearing schedule (on at all times/off for ADL's):  (verbalized understanding) Proper positioning of operated UE when showering:  (verbalized understanding) Dressing change:  (NA) Positioning of UE while sleeping:  (verbalized understanding)    Home Living Family/patient expects to be discharged to:: Private residence Living Arrangements: Alone Available Help at Discharge: Family;Available PRN/intermittently Type of Home: House Home Access: Stairs to enter [employer] of Steps: 3 Entrance Stairs-Rails: Right;Left;Can reach both Home Layout: One level     Bathroom Shower/Tub: Producer, television/film/video: Handicapped height     Home Equipment: Shower seat;Hand held shower head          Prior Functioning/Environment Prior Level of Function : Independent/Modified Independent                    OT Problem List: Decreased strength;Decreased range of motion;Impaired balance (sitting and/or standing);Impaired UE functional use        OT Goals(Current goals can be found in the care plan section)   Acute Rehab OT Goals Patient Stated Goal: to go home today      AM-PAC OT "6 Clicks" Daily Activity     Outcome Measure Help from another person eating meals?: A Little Help from another person taking care of personal grooming?: A Lot Help from another person toileting, which includes using toliet, bedpan, or urinal?: A Lot Help from another person bathing (including washing, rinsing, drying)?: A Lot Help  from another person to put on and taking off regular upper body clothing?: A Lot Help from another person to put on and taking off regular lower body clothing?: A Little 6 Click Score: 14   End of Session Nurse Communication:  (pt ready to go from therapy standpoint)  Activity Tolerance: Patient tolerated treatment well Patient left:  (sitting EOB)  OT Visit Diagnosis: Unsteadiness on feet (R26.81);Muscle weakness (generalized) (M62.81)                Time: 1610-9604 OT Time Calculation (min): 30 min Charges:  OT General Charges $OT Visit: 1 Visit OT Evaluation $OT Eval Moderate Complexity: 1 Mod OT Treatments $Self Care/Home Management : 8-22 mins  Merryl Abraham OT Acute Rehabilitation Services Office [phone]    Lenox Raider 12/28/2023, 11:58 AM

## 2024-01-03 ENCOUNTER — Encounter (HOSPITAL_COMMUNITY): Payer: Self-pay | Admitting: Orthopedic Surgery

## 2024-01-03 ENCOUNTER — Telehealth: Payer: Self-pay | Admitting: Surgical

## 2024-01-03 NOTE — Telephone Encounter (Signed)
Meghan [name] this is okay

## 2024-01-03 NOTE — Telephone Encounter (Signed)
LVM giving verbal ok 

## 2024-01-03 NOTE — Telephone Encounter (Signed)
 Is this ok?

## 2024-01-03 NOTE — Telephone Encounter (Signed)
 Tonya from [facility] health called and need verbal orders. Nursing 1 wk 4. She will also add a doctor for  diabetes. [phone]

## 2024-01-05 ENCOUNTER — Telehealth: Payer: Self-pay | Admitting: Orthopedic Surgery

## 2024-01-05 NOTE — Telephone Encounter (Signed)
 ok

## 2024-01-05 NOTE — Telephone Encounter (Signed)
 Sarah (RN) from [facility] called requesting verbal orders for eval OT. Please call Sarah on secure line at [phone].

## 2024-01-06 ENCOUNTER — Encounter: Admitting: Surgical

## 2024-01-06 NOTE — Telephone Encounter (Signed)
 Called and gave verbal ok

## 2024-01-10 ENCOUNTER — Telehealth: Payer: Self-pay | Admitting: Orthopedic Surgery

## 2024-01-10 NOTE — Telephone Encounter (Signed)
 Okay for occupational therapy 2 times a week for 3 weeks for shoulder active and passive range of motion of pain tolerance with no restriction.  Hold off on strengthening until 6 weeks postop.

## 2024-01-10 NOTE — Telephone Encounter (Signed)
 I left Stacy detailed voicemail advising.

## 2024-01-10 NOTE — Telephone Encounter (Signed)
 Stacy (OT) called from [facility] called requesting verbals for 2wk 3 and also requesting protocol. Stacy secure number is [phone].

## 2024-01-11 ENCOUNTER — Ambulatory Visit (INDEPENDENT_AMBULATORY_CARE_PROVIDER_SITE_OTHER): Admitting: Surgical

## 2024-01-11 ENCOUNTER — Encounter: Payer: Self-pay | Admitting: Surgical

## 2024-01-11 ENCOUNTER — Other Ambulatory Visit (INDEPENDENT_AMBULATORY_CARE_PROVIDER_SITE_OTHER): Payer: Self-pay

## 2024-01-11 DIAGNOSIS — Z96611 Presence of right artificial shoulder joint: Secondary | ICD-10-CM | POA: Diagnosis not present

## 2024-01-11 NOTE — Addendum Note (Signed)
 Addended by: Acey Ace on: 01/11/2024 01:51 PM   Modules accepted: Orders

## 2024-01-11 NOTE — Progress Notes (Signed)
 Post-Op Visit Note   Patient: Meghan [name]           Date of Birth: 02-29-1956           MRN: [id number] Visit Date: 01/11/2024 PCP: Orlena Bitters, MD   Assessment & Plan:  Chief Complaint:  Chief Complaint  Patient presents with   Right Shoulder - Routine Post Op   Visit Diagnoses:  1. Status post reverse total replacement of right shoulder     Plan: Meghan [name] is a 68 y.o. female who presents s/p right reverse shoulder arthroplasty on 12/27/2023.  Patient is doing well and pain is overall controlled.  Using CPM machine as instructed.  Denies any chest pain, SOB, fevers, chills.  No complaint of any instability symptoms.  Really having no significant pain.  Pain is well-controlled.  Using CPM sheen up to 92 degrees at home.  No fevers or chills.  No new numbness or tingling since surgery..    On exam, patient has range of motion 10 degrees X rotation, 80 degrees abduction, 120 degrees forward elevation passively..  Intact EPL, FPL, finger abduction, finger adduction, pronation/supination, bicep, tricep, deltoid of operative extremity.  Axillary nerve intact with deltoid firing.  Incision is healing well without evidence of infection or dehiscence.  Incision was made sure to be covered with Steri-Strips from the proximal to distal aspect of the length of the incision.  2+ radial pulse of the operative extremity  Plan is discontinue sling.  Okay to very lightly lift with the operative extremity but no lifting anything heavier than a coffee cup or cell phone.  Start physical therapy in [place] to focus on passive range of motion and active range of motion with deltoid isometrics.  Do not want to externally rotate past 30 degrees to protect subscapularis repair.  Follow-up in 4 weeks for clinical recheck  Follow-Up Instructions: No follow-ups on file.   Orders:  Orders Placed This Encounter  Procedures   XR Shoulder Right   No orders of the defined types were placed  in this encounter.   Imaging: No results found.  PMFS History: Patient Active Problem List   Diagnosis Date Noted   S/P reverse total shoulder arthroplasty, 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   Osteomyelitis of third toe of left foot (HCC)    History of gout 06/30/2012   DM (diabetes mellitus), type 2, uncontrolled, periph vascular complic 06/30/2012   HTN (hypertension) 06/30/2012   Cellulitis of leg 06/30/2012   ARF (acute renal failure) (HCC) 06/30/2012   Past Medical History:  Diagnosis Date   Anemia 2020   iron infusions   Anxiety    Arthritis    Cellulitis 06/30/2012   RLE   Depression    Diabetic peripheral neuropathy (HCC)    GERD (gastroesophageal reflux disease)    Headache    High cholesterol    Hypertension    Seasonal allergies    Type II diabetes mellitus (HCC)    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    Family History  Adopted: Yes  Problem Relation Age of Onset   Dementia Mother    Heart attack Father     Past Surgical History:  Procedure Laterality Date   AMPUTATION  ~ 2008   "2nd toe of right foot" (06/30/2012)   AMPUTATION  07/04/2012   Procedure: AMPUTATION Meghan [name];  Surgeon: Timothy Ford, MD;  Location: [facility] OR;  Service: Orthopedics;  Laterality: Right;  great toe  amp vs Meghan [name] amp   AMPUTATION  Left 02/13/2021   Procedure: LEFT FOOT 3RD AND 4TH Meghan [name] AMPUTATION;  Surgeon: Timothy Ford, MD;  Location: [facility] OR;  Service: Orthopedics;  Laterality: Left;   BICEPT TENODESIS Right 12/27/2023   Procedure: BICEPS TENODESIS;  Surgeon: Jasmine Mesi, MD;  Location: [facility] OR;  Service: Orthopedics;  Laterality: Right;   CARPAL TUNNEL RELEASE Right    04-25-2023 Dr. Rozelle Corning [facility])   INCISION AND DRAINAGE OF WOUND  03/10/2007   "mediastinitis", left medial collarbone (06/30/2012)   LYMPHADENECTOMY  08/09/2005   "bilateral groins; had cancer vulva" (06/30/2012)   REVERSE SHOULDER ARTHROPLASTY Right 12/27/2023   Procedure: REVERSE SHOULDER ARTHROPLASTY;  Surgeon: Jasmine Mesi,  MD;  Location: [facility] OR;  Service: Orthopedics;  Laterality: Right;   TUBAL LIGATION     VULVA SURGERY  08/09/2005   "took cancer off" (06/30/2012)   WISDOM TOOTH EXTRACTION     Social History   Occupational History   Not on file  Tobacco Use   Smoking status: Former    Current packs/day: 0.00    Types: Cigarettes    Quit date: 11/1998    Years since quitting: 25.1   Smokeless tobacco: Never   Tobacco comments:    06/30/2012 "quit cigarette smoking in 1999"  Vaping Use   Vaping status: Never Used  Substance and Sexual Activity   Alcohol use: Not Currently   Drug use: Yes    Frequency: 3.0 times per week    Types: Marijuana   Sexual activity: Not Currently    Birth control/protection: Post-menopausal

## 2024-01-13 ENCOUNTER — Telehealth: Payer: Self-pay | Admitting: Radiology

## 2024-01-13 NOTE — Telephone Encounter (Signed)
 Meghan [name], OT with [facility]  Requesting 2x week 4weeks and 1x week 4weeks

## 2024-01-13 NOTE — Telephone Encounter (Signed)
 I left voicemail at [phone] with verbal orders.

## 2024-01-21 DIAGNOSIS — M19011 Primary osteoarthritis, right shoulder: Secondary | ICD-10-CM

## 2024-01-30 ENCOUNTER — Telehealth: Payer: Self-pay | Admitting: Orthopedic Surgery

## 2024-01-30 NOTE — Telephone Encounter (Signed)
 IC LMVM with verbal order for OT.

## 2024-01-30 NOTE — Telephone Encounter (Signed)
 Don called from [place]. Would like Dr. Addie to know that patient is no longer receiving home OT. Need outpatient referral starting 6/26. He would like a call back to confirm message was received. [phone]

## 2024-01-31 ENCOUNTER — Telehealth: Payer: Self-pay | Admitting: Surgical

## 2024-01-31 NOTE — Telephone Encounter (Signed)
 Todd ( OT) called from [facility] called stating pt has completed [facility] OT and asking for referral for outpatient OT somewhere in [place]. Don secure number is [phone]

## 2024-02-01 ENCOUNTER — Other Ambulatory Visit: Payer: Self-pay

## 2024-02-01 DIAGNOSIS — Z96611 Presence of right artificial shoulder joint: Secondary | ICD-10-CM

## 2024-02-01 NOTE — Telephone Encounter (Signed)
 IC referral placed. Patient aware someone will be calling to schedule therapy.

## 2024-02-01 NOTE — Telephone Encounter (Signed)
 Okay for this. Main restriction would be no lifting >5 lbs and no external rotation past 40 degrees

## 2024-02-07 NOTE — Discharge Summary (Signed)
 Physician Discharge Summary      Patient ID: Meghan [name] MRN: [id number] DOB/AGE: 1955/10/06 68 y.o.  Admit date: 12/27/2023 Discharge date: 12/28/2023  Admission Diagnoses:  Principal Problem:   S/P reverse total shoulder arthroplasty, right Active Problems:   Arthritis of right shoulder   Discharge Diagnoses:  Same  Surgeries: Procedure(s): REVERSE SHOULDER ARTHROPLASTY BICEPS TENODESIS on 12/27/2023   Consultants:   Discharged Condition: Stable  Hospital Course: Meghan [name] is an 68 y.o. female who was admitted 12/27/2023 with a chief complaint of right shoulder pain, and found to have a diagnosis of right shoulder arthritis.  They were brought to the operating room on 12/27/2023 and underwent the above named procedures.  Pt awoke from anesthesia without complication and was transferred to the floor. On POD1, patient's pain was overall controlled.  No red flag signs or symptoms.  She was able to mobilize well without difficulty.  Discharged home on POD 1..  Pt will f/u with Dr. Addie in clinic in ~2 weeks.   Antibiotics given:  Anti-infectives (From admission, onward)    Start     Dose/Rate Route Frequency Ordered Stop   12/27/23 1400  ceFAZolin  (ANCEF ) IVPB 2g/100 mL premix        2 g 200 mL/hr over 30 Minutes Intravenous Every 8 hours 12/27/23 1153 12/28/23 0824   12/27/23 1000  vancomycin  (VANCOCIN ) powder  Status:  Discontinued          As needed 12/27/23 1021 12/27/23 1109   12/27/23 0715  ceFAZolin  (ANCEF ) IVPB 2g/100 mL premix        2 g 200 mL/hr over 30 Minutes Intravenous On call to O.R. 12/27/23 9290 12/27/23 9176     .  Recent vital signs:  Vitals:   12/28/23 0525 12/28/23 0814  BP: (!) 102/51 (!) 104/41  Pulse: 66 70  Resp:  17  Temp:  98.1 F (36.7 C)  SpO2: 96% 100%    Recent laboratory studies:  Results for orders placed or performed during the hospital encounter of 12/27/23  Glucose, capillary   Collection Time:  12/27/23  7:18 AM  Result Value Ref Range   Glucose-Capillary 102 (H) 70 - 99 mg/dL  Glucose, capillary   Collection Time: 12/27/23 11:15 AM  Result Value Ref Range   Glucose-Capillary 131 (H) 70 - 99 mg/dL  Glucose, capillary   Collection Time: 12/27/23  1:18 PM  Result Value Ref Range   Glucose-Capillary 194 (H) 70 - 99 mg/dL  Glucose, capillary   Collection Time: 12/27/23  6:23 PM  Result Value Ref Range   Glucose-Capillary 373 (H) 70 - 99 mg/dL  Glucose, capillary   Collection Time: 12/27/23  8:45 PM  Result Value Ref Range   Glucose-Capillary 334 (H) 70 - 99 mg/dL   Comment 1 Notify RN    Comment 2 Document in Chart   Glucose, capillary   Collection Time: 12/28/23  6:00 AM  Result Value Ref Range   Glucose-Capillary 118 (H) 70 - 99 mg/dL   Comment 1 Notify RN    Comment 2 Document in Chart     Discharge Medications:   Allergies as of 12/28/2023   No Known Allergies      Medication List     STOP taking these medications    doxycycline  100 MG tablet Commonly known as: VIBRA -TABS   traMADol  50 MG tablet Commonly known as: Ultram        TAKE these medications    Airborne [employer]  2 tablets by mouth daily.   aspirin  EC 81 MG tablet Take 1 tablet (81 mg total) by mouth daily. Swallow whole.   cetirizine 10 MG tablet Commonly known as: ZYRTEC Take 10 mg by mouth in the morning.   docusate sodium  100 MG capsule Commonly known as: COLACE Take 1 capsule (100 mg total) by mouth 2 (two) times daily.   Dulaglutide 3 MG/0.5ML Soaj Inject 3 mg into the skin once a week.   FLUoxetine  40 MG capsule Commonly known as: PROZAC  Take 40 mg by mouth in the morning.   insulin  glargine 100 UNIT/ML injection Commonly known as: LANTUS  Inject 65 Units into the skin 2 (two) times daily.   insulin  lispro 100 UNIT/ML injection Commonly known as: HUMALOG Inject 0-10 Units into the skin 3 (three) times daily before meals.   meloxicam 15 MG  tablet Commonly known as: MOBIC Take 15 mg by mouth daily as needed for pain.   methocarbamol  500 MG tablet Commonly known as: ROBAXIN  Take 1 tablet (500 mg total) by mouth every 8 (eight) hours as needed for muscle spasms.   metoprolol  tartrate 50 MG tablet Commonly known as: LOPRESSOR  Take 50 mg by mouth in the morning.   OneTouch Ultra test strip Generic drug: glucose blood USE TO TEST BLOOD SUGAR TWICE DAILY   oxyCODONE  5 MG immediate release tablet Commonly known as: Oxy IR/ROXICODONE  Take 1 tablet (5 mg total) by mouth every 4 (four) hours as needed for moderate pain (pain score 4-6) (pain score 4-6; 5 mg for pain score 4-5, 10 mg for pan score 6).   pantoprazole  40 MG tablet Commonly known as: PROTONIX  Take 40 mg by mouth in the morning.   pregabalin  50 MG capsule Commonly known as: LYRICA  Take 50 mg by mouth 3 (three) times daily.   rosuvastatin 40 MG tablet Commonly known as: CRESTOR Take 40 mg by mouth every evening.   zinc gluconate 50 MG tablet Take 50 mg by mouth daily.        Diagnostic Studies: XR Shoulder Right Result Date: 01/11/2024 AP, scapular Y, axillary views of the shoulder reviewed.  Reverse shoulder arthroplasty prosthesis in good position and alignment without any complicating features.  There is no evidence of periprosthetic fracture, dislocation, dissociation of the glenosphere.    Disposition: Discharge disposition: 01-Home or Self Care       Discharge Instructions     Call MD / Call 911   Complete by: As directed    If you experience chest pain or shortness of breath, CALL 911 and be transported to the hospital emergency room.  If you develope a fever above 101 F, pus (white drainage) or increased drainage or redness at the wound, or calf pain, call your surgeon's office.   Constipation Prevention   Complete by: As directed    Drink plenty of fluids.  Prune juice may be helpful.  You may use a stool softener, such as Colace (over  the counter) 100 mg twice a day.  Use MiraLax (over the counter) for constipation as needed.   Diet - low sodium heart healthy   Complete by: As directed    Discharge instructions   Complete by: As directed    You may shower, dressing is waterproof.  Do not bathe or soak the operative shoulder in a tub, pool.  Use the CPM machine 3 times a day for one hour each time.  No lifting with the operative shoulder. Continue use of the sling.  Follow-up  with Dr. Addie in ~2 weeks on your given appointment date.  We will remove your adhesive bandage at that time.    Dental Antibiotics:  In most cases prophylactic antibiotics for Dental procdeures after total joint surgery are not necessary.  Exceptions are as follows:  1. History of prior total joint infection  2. Severely immunocompromised (Organ Transplant, cancer chemotherapy, Rheumatoid biologic meds such as Humera)  3. Poorly controlled diabetes (A1C &gt; 8.0, blood glucose over 200)  If you have one of these conditions, contact your surgeon for an antibiotic prescription, prior to your dental procedure.   Increase activity slowly as tolerated   Complete by: As directed    Post-operative opioid taper instructions:   Complete by: As directed    POST-OPERATIVE OPIOID TAPER INSTRUCTIONS: It is important to wean off of your opioid medication as soon as possible. If you do not need pain medication after your surgery it is ok to stop day one. Opioids include: Codeine, Hydrocodone(Norco, Vicodin), Oxycodone (Percocet, oxycontin ) and hydromorphone  amongst others.  Long term and even short term use of opiods can cause: Increased pain response Dependence Constipation Depression Respiratory depression And more.  Withdrawal symptoms can include Flu like symptoms Nausea, vomiting And more Techniques to manage these symptoms Hydrate well Eat regular healthy meals Stay active Use relaxation techniques(deep breathing, meditating, yoga) Do Not  substitute Alcohol to help with tapering If you have been on opioids for less than two weeks and do not have pain than it is ok to stop all together.  Plan to wean off of opioids This plan should start within one week post op of your joint replacement. Maintain the same interval or time between taking each dose and first decrease the dose.  Cut the total daily intake of opioids by one tablet each day Next start to increase the time between doses. The last dose that should be eliminated is the evening dose.           Follow-up Information     [facility], [facility] Follow up.   Specialty: Home Health Services Why: They will contact you in 1-2 days to set up [facility] services Contact information: [address] [place] [address] [phone]                  Signed: Herlene Calix 02/07/2024, 4:06 PM

## 2024-02-08 ENCOUNTER — Encounter: Admitting: Surgical

## 2024-02-13 ENCOUNTER — Ambulatory Visit: Attending: Surgical | Admitting: Physical Therapy

## 2024-02-13 ENCOUNTER — Encounter: Payer: Self-pay | Admitting: Physical Therapy

## 2024-02-13 DIAGNOSIS — M6281 Muscle weakness (generalized): Secondary | ICD-10-CM | POA: Diagnosis present

## 2024-02-13 DIAGNOSIS — Z96611 Presence of right artificial shoulder joint: Secondary | ICD-10-CM | POA: Insufficient documentation

## 2024-02-13 DIAGNOSIS — M25511 Pain in right shoulder: Secondary | ICD-10-CM | POA: Insufficient documentation

## 2024-02-13 DIAGNOSIS — R600 Localized edema: Secondary | ICD-10-CM | POA: Diagnosis not present

## 2024-02-13 NOTE — Therapy (Signed)
 OUTPATIENT PHYSICAL THERAPY EVALUATION   Patient Name: Meghan [name] MRN: [id number] DOB:02-27-1956, 68 y.o., female Today's Date: 02/13/2024  END OF SESSION:  PT End of Session - 02/13/24 1332     Visit Number 1    Number of Visits 18    Date for PT Re-Evaluation 04/09/24    PT Start Time 1100    PT Stop Time 1200    PT Time Calculation (min) 60 min    Activity Tolerance Patient tolerated treatment well    Behavior During Therapy [facility] for tasks assessed/performed          Past Medical History:  Diagnosis Date   Anemia 2020   iron infusions   Anxiety    Arthritis    Cellulitis 06/30/2012   RLE   Depression    Diabetic peripheral neuropathy (HCC)    GERD (gastroesophageal reflux disease)    Headache    High cholesterol    Hypertension    Seasonal allergies    Type II diabetes mellitus (HCC)    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   Past Surgical History:  Procedure Laterality Date   AMPUTATION  ~ 2008   2nd toe of right foot (06/30/2012)   AMPUTATION  07/04/2012   Procedure: AMPUTATION RAY;  Surgeon: Jerona LULLA Sage, MD;  Location: [facility] OR;  Service: Orthopedics;  Laterality: Right;  great toe  amp vs ray amp   AMPUTATION Left 02/13/2021   Procedure: LEFT FOOT 3RD AND 4TH RAY AMPUTATION;  Surgeon: Sage Jerona LULLA, MD;  Location: [facility] OR;  Service: Orthopedics;  Laterality: Left;   BICEPT TENODESIS Right 12/27/2023   Procedure: BICEPS TENODESIS;  Surgeon: Addie Cordella Hamilton, MD;  Location: [facility] OR;  Service: Orthopedics;  Laterality: Right;   CARPAL TUNNEL RELEASE Right    04-25-2023 Dr. Addie [facility])   INCISION AND DRAINAGE OF WOUND  03/10/2007   mediastinitis, left medial collarbone (06/30/2012)   LYMPHADENECTOMY  08/09/2005   bilateral groins; had cancer vulva (06/30/2012)   REVERSE SHOULDER ARTHROPLASTY Right 12/27/2023   Procedure: REVERSE SHOULDER ARTHROPLASTY;  Surgeon: Addie Cordella Hamilton, MD;  Location: [facility] OR;  Service: Orthopedics;  Laterality: Right;    TUBAL LIGATION     VULVA SURGERY  08/09/2005   took cancer off (06/30/2012)   WISDOM TOOTH EXTRACTION     Patient Active Problem List   Diagnosis Date Noted   Arthritis of right shoulder 01/21/2024   S/P reverse total shoulder arthroplasty, 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   Osteomyelitis of third toe of left foot (HCC)    History of gout 06/30/2012   DM (diabetes mellitus), type 2, uncontrolled, periph vascular complic 06/30/2012   HTN (hypertension) 06/30/2012   Cellulitis of leg 06/30/2012   ARF (acute renal failure) (HCC) 06/30/2012    PCP: Rosamond Leta NOVAK, MD   REFERRING PROVIDER: Shirly Carlin LITTIE, PA-C   REFERRING DIAG: [phone] (ICD-10-CM) - Status post reverse total replacement of right shoulder   Rationale for Evaluation and Treatment:  Rehabiliation  THERAPY DIAG:  Acute pain of right shoulder  Muscle weakness (generalized)  ONSET DATE: s/p right reverse shoulder arthroplasty on 12/27/2023    SUBJECTIVE:  SUBJECTIVE STATEMENT: She had shoulder replacement surgery in May, overall has been doing well with pain but lacking strength to lift her arm up above shoulder level. She has been using her CPM machine at home. She is finished with home therapy now.  She is right handed.   PERTINENT HISTORY:  See above PMH  PAIN:  NPRS scale: 1/10 upon arrival Pain location:Right shoulder Pain description: achey Aggravating factors: lifting arm Relieving factors: rest   PRECAUTIONS: ,  right shoulder s/p arthroplasty--no lifting >5 lbs and no external rotation past 40 degrees   RED FLAGS: None   WEIGHT BEARING RESTRICTIONS:  No  FALLS:  Has patient fallen in last 6 months? Yes. Number of falls 1, she tripped in parking lot, was scraped up but did not appear to have any significant  injury   OCCUPATION:  none  PLOF:  Independent  PATIENT GOALS:  Improve ability to use her right arm and reach up  OBJECTIVE:  Note: Objective measures were completed at Evaluation unless otherwise noted.   PATIENT SURVEYS:  Patient-Specific Activity Scoring Scheme  0 represents "unable to perform." 10 represents "able to perform at prior level. 0 1 2 3 4 5 6 7 8 9  10 (Date and Score)   Activity Eval     1. Reaching overhead 0     2. Washing hair 3    3. Driving 0   4.    5.    Score 1/10    Total score = sum of the activity scores/number of activities Minimum detectable change (90%CI) for average score = 2 points Minimum detectable change (90%CI) for single activity score = 3 points     EDEMA:  No   Shoulder  PALPATION: Scar is healing well with no sign of infection.   UPPER EXTREMITY ROM:  Active ROM/PROM Right eval Left eval  Shoulder flexion 60/140   Shoulder extension    Shoulder abduction 30/140   Shoulder adduction    Shoulder extension    Shoulder internal rotation Right SIJ  reaching behind back/WFL   Shoulder external rotation 15 /40   Elbow flexion    Elbow extension    Wrist flexion    Wrist extension    Wrist ulnar deviation    Wrist radial deviation    Wrist pronation    Wrist supination     (Blank rows = not tested)   UPPER EXTREMITY MMT:  MMT Right eval Left eval  Shoulder flexion 2   Shoulder extension    Shoulder abduction 2   Shoulder adduction    Shoulder extension    Shoulder internal rotation 2   Shoulder external rotation 2   Middle trapezius    Lower trapezius    Elbow flexion    Elbow extension    Wrist flexion    Wrist extension    Wrist ulnar deviation    Wrist radial deviation    Wrist pronation    Wrist supination    Grip strength     (Blank rows = not tested)    FUNCTIONAL TESTS:  02/13/24: Grip strength HHD Rt: 35# Lt:20#  TREATMENT DATE:  Eval HEP creation and review with demonstration and trial set preformed, see below for details Right shoulder PROM gentle to tolerance all planes, keeping ER to 40 deg limit for now per surgical protocol.  Selfcare:importance of protein intake to improve strength post surgically, scar massage for right shoulder with vitamin E lotion or cocoa butter, wound care management for left side as well as advice to seek Xray if she is in pain or inability to weight bear on left leg or arm due to recent fall on that side.     PATIENT EDUCATION: Education details: HEP, PT plan of care, selfcare Person educated: Patient Education method: Explanation, Demonstration, Verbal cues, and Handouts Education comprehension: verbalized understanding, further education recommended   HOME EXERCISE PROGRAM: Access Code: [email or web address] URL: [email or web address] Date: 02/13/2024 Prepared by: Redell Moose  Exercises - Shoulder Flexion Overhead with [place] (Mirrored)  - 2 x daily - 6 x weekly - 1 sets - 15 reps - Shoulder Scaption AAROM with [place]  - 2 x daily - 6 x weekly - 1 sets - 15 reps - Standing Shoulder Internal Rotation AAROM with [place]el (Mirrored)  - 2 x daily - 6 x weekly - 1 sets - 15 reps - Standing Isometric Shoulder Internal Rotation at Doorway  - 2 x daily - 6 x weekly - 1 sets - 10 reps - 5 hold - Standing Isometric Shoulder External Rotation with Doorway (Mirrored)  - 2 x daily - 6 x weekly - 1 sets - 10 reps - 5 hold - Isometric Shoulder Flexion at Wall  - 2 x daily - 6 x weekly - 1 sets - 10 reps - 5 hold - Isometric Shoulder Extension at Wall  - 2 x daily - 6 x weekly - 1 sets - 10 reps - 5 sec hold - Standing Isometric Shoulder Abduction with Doorway - Arm Bent (Mirrored)  - 2 x daily - 6 x weekly - 1 sets - 10 reps  ASSESSMENT:  CLINICAL IMPRESSION: Nyeli was referred to PT for eval and treat right  shoulder s/p reverse arthroplasty 12/27/23 with following restrictions--no lifting >5 lbs and no external rotation past 40 degrees. Patient will benefit from skilled PT to improve overall function and to address impairments and limitations listed below.  OBJECTIVE IMPAIRMENTS: decreased activity tolerance for ADL's,  decreased shoulder mobility, decreased shoulder ROM, decreased strength, impaired flexibility, impaired UE use, and pain.  ACTIVITY LIMITATIONS:, lifting, carry, reaching,, cleaning,driving, and or occupation  PERSONAL FACTORS: DM, ARF also affecting patient's functional outcome.  REHAB POTENTIAL: Good  CLINICAL DECISION MAKING: Low  EVALUATION COMPLEXITY: Low    GOALS: Short term PT Goals Target date: 03/12/2024   Pt will be I and compliant with HEP. Baseline:  Goal status: New Pt will improve right shoulder ROM flexion to at least 90 degrees Baseline: Goal status: New  Long term PT goals Target date:04/09/2024   Pt will improve shoulder AROM to [facility] to improve functional mobility Baseline: Goal status: New Pt will improve shoulder strength to at least 4/5 MMT to improve functional strength Baseline: Goal status: New Pt will improve Patient specific functional scale (PSFS) to at least 6/10 to show improved function level Baseline:1/10 Goal status: New Pt will reduce pain to overall less than 3/10 with usual activity and ADL's Baseline: Goal status: New  PLAN: PT FREQUENCY: 2-3 times per week   PT DURATION: 6-8 weeks  PLANNED INTERVENTIONS (unless contraindicated):  97110-Therapeutic exercises, 97530- Therapeutic activity, [id number]- Neuromuscular re-education, 97535-  Self Care, 02859- Manual therapy, G0283- Electrical stimulation (unattended), 97016- Vasopneumatic device, [id number]- Ultrasound, [id number]- Ionotophoresis 4mg /ml Dexamethasone , Joint mobilization, and Joint manipulation  PLAN FOR NEXT SESSION: --no lifting >5 lbs and no external rotation past 40 degrees  until NEXT MD VISIT: 02/22/24  Redell JONELLE Moose, PT,DPT 02/13/2024, 2:12 PM

## 2024-02-15 ENCOUNTER — Ambulatory Visit: Admitting: Physical Therapy

## 2024-02-15 ENCOUNTER — Encounter: Payer: Self-pay | Admitting: Physical Therapy

## 2024-02-15 DIAGNOSIS — M25511 Pain in right shoulder: Secondary | ICD-10-CM | POA: Diagnosis not present

## 2024-02-15 DIAGNOSIS — M6281 Muscle weakness (generalized): Secondary | ICD-10-CM

## 2024-02-15 NOTE — Therapy (Signed)
 OUTPATIENT PHYSICAL THERAPY TREATMENT   Patient Name: Meghan [name] MRN: [id number] DOB:1956/02/23, 68 y.o., female Today's Date: 02/15/2024  END OF SESSION:  PT End of Session - 02/15/24 1010     Visit Number 2    Number of Visits 18    Date for PT Re-Evaluation 04/09/24    PT Start Time 1005    PT Stop Time 1058    PT Time Calculation (min) 53 min    Activity Tolerance Patient tolerated treatment well    Behavior During Therapy [facility] for tasks assessed/performed          Past Medical History:  Diagnosis Date   Anemia 2020   iron infusions   Anxiety    Arthritis    Cellulitis 06/30/2012   RLE   Depression    Diabetic peripheral neuropathy (HCC)    GERD (gastroesophageal reflux disease)    Headache    High cholesterol    Hypertension    Seasonal allergies    Type II diabetes mellitus (HCC)    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   Past Surgical History:  Procedure Laterality Date   AMPUTATION  ~ 2008   2nd toe of right foot (06/30/2012)   AMPUTATION  07/04/2012   Procedure: AMPUTATION RAY;  Surgeon: Jerona LULLA Sage, MD;  Location: [facility] OR;  Service: Orthopedics;  Laterality: Right;  great toe  amp vs ray amp   AMPUTATION Left 02/13/2021   Procedure: LEFT FOOT 3RD AND 4TH RAY AMPUTATION;  Surgeon: Sage Jerona LULLA, MD;  Location: [facility] OR;  Service: Orthopedics;  Laterality: Left;   BICEPT TENODESIS Right 12/27/2023   Procedure: BICEPS TENODESIS;  Surgeon: Addie Cordella Hamilton, MD;  Location: [facility] OR;  Service: Orthopedics;  Laterality: Right;   CARPAL TUNNEL RELEASE Right    04-25-2023 Dr. Addie [facility])   INCISION AND DRAINAGE OF WOUND  03/10/2007   mediastinitis, left medial collarbone (06/30/2012)   LYMPHADENECTOMY  08/09/2005   bilateral groins; had cancer vulva (06/30/2012)   REVERSE SHOULDER ARTHROPLASTY Right 12/27/2023   Procedure: REVERSE SHOULDER ARTHROPLASTY;  Surgeon: Addie Cordella Hamilton, MD;  Location: [facility] OR;  Service: Orthopedics;  Laterality: Right;    TUBAL LIGATION     VULVA SURGERY  08/09/2005   took cancer off (06/30/2012)   WISDOM TOOTH EXTRACTION     Patient Active Problem List   Diagnosis Date Noted   Arthritis of right shoulder 01/21/2024   S/P reverse total shoulder arthroplasty, 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   Osteomyelitis of third toe of left foot (HCC)    History of gout 06/30/2012   DM (diabetes mellitus), type 2, uncontrolled, periph vascular complic 06/30/2012   HTN (hypertension) 06/30/2012   Cellulitis of leg 06/30/2012   ARF (acute renal failure) (HCC) 06/30/2012    PCP: Rosamond Leta NOVAK, MD   REFERRING PROVIDER: Rosamond Leta NOVAK, MD   REFERRING DIAG: [phone] (ICD-10-CM) - Status post reverse total replacement of right shoulder   Rationale for Evaluation and Treatment:  Rehabiliation  THERAPY DIAG:  Acute pain of right shoulder  Muscle weakness (generalized)  ONSET DATE: s/p right reverse shoulder arthroplasty on 12/27/2023    SUBJECTIVE:  SUBJECTIVE STATEMENT: She relays compliance with HEP, does not have any questions about them and understands them  PERTINENT HISTORY:  See above PMH  PAIN:  NPRS scale: 1/10 upon arrival Pain location:Right shoulder Pain description: achey Aggravating factors: lifting arm Relieving factors: rest   PRECAUTIONS: ,  right shoulder s/p arthroplasty--no lifting >5 lbs and no external rotation past 40 degrees   RED FLAGS: None   WEIGHT BEARING RESTRICTIONS:  No  FALLS:  Has patient fallen in last 6 months? Yes. Number of falls 1, she tripped in parking lot, was scraped up but did not appear to have any significant injury   OCCUPATION:  none  PLOF:  Independent  PATIENT GOALS:  Improve ability to use her right arm and reach up  OBJECTIVE:  Note: Objective measures were  completed at Evaluation unless otherwise noted.   PATIENT SURVEYS:  Patient-Specific Activity Scoring Scheme  0 represents "unable to perform." 10 represents "able to perform at prior level. 0 1 2 3 4 5 6 7 8 9  10 (Date and Score)   Activity Eval     1. Reaching overhead 0     2. Washing hair 3    3. Driving 0   4.    5.    Score 1/10    Total score = sum of the activity scores/number of activities Minimum detectable change (90%CI) for average score = 2 points Minimum detectable change (90%CI) for single activity score = 3 points     EDEMA:  No   Shoulder  PALPATION: Scar is healing well with no sign of infection.   UPPER EXTREMITY ROM:  Active ROM/PROM Right eval Left eval  Shoulder flexion 60/140   Shoulder extension    Shoulder abduction 30/140   Shoulder adduction    Shoulder extension    Shoulder internal rotation Right SIJ  reaching behind back/WFL   Shoulder external rotation 15 /40   Elbow flexion    Elbow extension    Wrist flexion    Wrist extension    Wrist ulnar deviation    Wrist radial deviation    Wrist pronation    Wrist supination     (Blank rows = not tested)   UPPER EXTREMITY MMT:  MMT Right eval Left eval  Shoulder flexion 2   Shoulder extension    Shoulder abduction 2   Shoulder adduction    Shoulder extension    Shoulder internal rotation 2   Shoulder external rotation 2   Middle trapezius    Lower trapezius    Elbow flexion    Elbow extension    Wrist flexion    Wrist extension    Wrist ulnar deviation    Wrist radial deviation    Wrist pronation    Wrist supination    Grip strength     (Blank rows = not tested)    FUNCTIONAL TESTS:  02/13/24: Grip strength HHD Rt: 35# Lt:20#  TREATMENT DATE:  02/15/24 Thereex UBE L1 5 min total switch halfway Pendulums X 15 CW,CCW Sidelying  shoulder ER X 10 reps Sidelying shoulder abduction X 10 reps   Theractivity Wall ladder X 10 reps flexion, X 5 reps abduction UE ranger X 10 flexion, X 10 circles CW,CCW P ball roll up wall for shoulder flexion, 2 X 5 reps Supine shoulder flexion 1# 2X 5 reps Supine chest press 2# 2X 5 reps Supine ER and IR against manual resistance X 10 reps each  Supine circles at 90 deg 1# X 10 CW, X 10 CCW  Manual:  Right shoulder PROM gentle to tolerance all planes, keeping ER to 40 deg limit for now per surgical protocol.  Eval HEP creation and review with demonstration and trial set preformed, see below for details Right shoulder PROM gentle to tolerance all planes, keeping ER to 40 deg limit for now per surgical protocol.  Selfcare:importance of protein intake to improve strength post surgically, scar massage for right shoulder with vitamin E lotion or cocoa butter, wound care management for left side as well as advice to seek Xray if she is in pain or inability to weight bear on left leg or arm due to recent fall on that side.     PATIENT EDUCATION: Education details: HEP, PT plan of care, selfcare Person educated: Patient Education method: Explanation, Demonstration, Verbal cues, and Handouts Education comprehension: verbalized understanding, further education recommended   HOME EXERCISE PROGRAM: Access Code: [email or web address] URL: [email or web address] Date: 02/13/2024 Prepared by: Redell Moose  Exercises - Shoulder Flexion Overhead with Dowel (Mirrored)  - 2 x daily - 6 x weekly - 1 sets - 15 reps - Shoulder Scaption AAROM with Dowel  - 2 x daily - 6 x weekly - 1 sets - 15 reps - Standing Shoulder Internal Rotation AAROM with Dowel (Mirrored)  - 2 x daily - 6 x weekly - 1 sets - 15 reps - Standing Isometric Shoulder Internal Rotation at Doorway  - 2 x daily - 6 x weekly - 1 sets - 10 reps - 5 hold - Standing Isometric Shoulder External Rotation with Doorway (Mirrored)  - 2 x  daily - 6 x weekly - 1 sets - 10 reps - 5 hold - Isometric Shoulder Flexion at Wall  - 2 x daily - 6 x weekly - 1 sets - 10 reps - 5 hold - Isometric Shoulder Extension at Wall  - 2 x daily - 6 x weekly - 1 sets - 10 reps - 5 sec hold - Standing Isometric Shoulder Abduction with Doorway - Arm Bent (Mirrored)  - 2 x daily - 6 x weekly - 1 sets - 10 reps  ASSESSMENT:  CLINICAL IMPRESSION: Overall shows good compliance and understanding of HEP. She was able to lift her arm against gravity better today than eval but still with weakness as expected with this.   OBJECTIVE IMPAIRMENTS: decreased activity tolerance for ADL's,  decreased shoulder mobility, decreased shoulder ROM, decreased strength, impaired flexibility, impaired UE use, and pain.  ACTIVITY LIMITATIONS:, lifting, carry, reaching,, cleaning,driving, and or occupation  PERSONAL FACTORS: DM, ARF also affecting patient's functional outcome.  REHAB POTENTIAL: Good  CLINICAL DECISION MAKING: Low  EVALUATION COMPLEXITY: Low    GOALS: Short term PT Goals Target date: 03/12/2024   Pt will be I and compliant with HEP. Baseline:  Goal status: New Pt will improve right shoulder ROM flexion to at least 90 degrees Baseline: Goal status: New  Long term PT goals  Target date:04/09/2024   Pt will improve shoulder AROM to [facility] to improve functional mobility Baseline: Goal status: New Pt will improve shoulder strength to at least 4/5 MMT to improve functional strength Baseline: Goal status: New Pt will improve Patient specific functional scale (PSFS) to at least 6/10 to show improved function level Baseline:1/10 Goal status: New Pt will reduce pain to overall less than 3/10 with usual activity and ADL's Baseline: Goal status: New  PLAN: PT FREQUENCY: 2-3 times per week   PT DURATION: 6-8 weeks  PLANNED INTERVENTIONS (unless contraindicated):  97110-Therapeutic exercises, 97530- Therapeutic activity, [id number]- Neuromuscular  re-education, 97535- Self Care, 02859- Manual therapy, [id number]- Electrical stimulation (unattended), 97016- Vasopneumatic device, 97035- Ultrasound, 02966- Ionotophoresis 4mg /ml Dexamethasone , Joint mobilization, and Joint manipulation  PLAN FOR NEXT SESSION: -- Monitor for soreness.  no lifting >5 lbs and no external rotation past 40 degrees until NEXT MD VISIT: 02/22/24  Redell JONELLE Moose, PT,DPT 02/15/2024, 11:01 AM

## 2024-02-17 ENCOUNTER — Encounter: Payer: Self-pay | Admitting: Physical Therapy

## 2024-02-17 ENCOUNTER — Ambulatory Visit: Admitting: Physical Therapy

## 2024-02-17 DIAGNOSIS — M25511 Pain in right shoulder: Secondary | ICD-10-CM

## 2024-02-17 DIAGNOSIS — M6281 Muscle weakness (generalized): Secondary | ICD-10-CM

## 2024-02-17 NOTE — Therapy (Signed)
 OUTPATIENT PHYSICAL THERAPY TREATMENT   Patient Name: Meghan [name] MRN: [id number] DOB:Dec 23, 1955, 68 y.o., female Today's Date: 02/17/2024  END OF SESSION:  PT End of Session - 02/17/24 0933     Visit Number 3    Number of Visits 18    Date for PT Re-Evaluation 04/09/24    PT Start Time 0925    PT Stop Time 1010    PT Time Calculation (min) 45 min    Activity Tolerance Patient tolerated treatment well    Behavior During Therapy [facility] for tasks assessed/performed          Past Medical History:  Diagnosis Date   Anemia 2020   iron infusions   Anxiety    Arthritis    Cellulitis 06/30/2012   RLE   Depression    Diabetic peripheral neuropathy (HCC)    GERD (gastroesophageal reflux disease)    Headache    High cholesterol    Hypertension    Seasonal allergies    Type II diabetes mellitus (HCC)    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   Past Surgical History:  Procedure Laterality Date   AMPUTATION  ~ 2008   2nd toe of right foot (06/30/2012)   AMPUTATION  07/04/2012   Procedure: AMPUTATION RAY;  Surgeon: Jerona LULLA Sage, MD;  Location: [facility] OR;  Service: Orthopedics;  Laterality: Right;  great toe  amp vs ray amp   AMPUTATION Left 02/13/2021   Procedure: LEFT FOOT 3RD AND 4TH RAY AMPUTATION;  Surgeon: Sage Jerona LULLA, MD;  Location: [facility] OR;  Service: Orthopedics;  Laterality: Left;   BICEPT TENODESIS Right 12/27/2023   Procedure: BICEPS TENODESIS;  Surgeon: Addie Cordella Hamilton, MD;  Location: [facility] OR;  Service: Orthopedics;  Laterality: Right;   CARPAL TUNNEL RELEASE Right    04-25-2023 Dr. Addie [facility])   INCISION AND DRAINAGE OF WOUND  03/10/2007   mediastinitis, left medial collarbone (06/30/2012)   LYMPHADENECTOMY  08/09/2005   bilateral groins; had cancer vulva (06/30/2012)   REVERSE SHOULDER ARTHROPLASTY Right 12/27/2023   Procedure: REVERSE SHOULDER ARTHROPLASTY;  Surgeon: Addie Cordella Hamilton, MD;  Location: [facility] OR;  Service: Orthopedics;  Laterality: Right;    TUBAL LIGATION     VULVA SURGERY  08/09/2005   took cancer off (06/30/2012)   WISDOM TOOTH EXTRACTION     Patient Active Problem List   Diagnosis Date Noted   Arthritis of right shoulder 01/21/2024   S/P reverse total shoulder arthroplasty, 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   Osteomyelitis of third toe of left foot (HCC)    History of gout 06/30/2012   DM (diabetes mellitus), type 2, uncontrolled, periph vascular complic 06/30/2012   HTN (hypertension) 06/30/2012   Cellulitis of leg 06/30/2012   ARF (acute renal failure) (HCC) 06/30/2012    PCP: Rosamond Leta NOVAK, MD   REFERRING PROVIDER: Rosamond Leta NOVAK, MD   REFERRING DIAG: [phone] (ICD-10-CM) - Status post reverse total replacement of right shoulder   Rationale for Evaluation and Treatment:  Rehabiliation  THERAPY DIAG:  Acute pain of right shoulder  Muscle weakness (generalized)  ONSET DATE: s/p right reverse shoulder arthroplasty on 12/27/2023    SUBJECTIVE:  SUBJECTIVE STATEMENT: She denies any soreness from last time.  PERTINENT HISTORY:  See above PMH  PAIN:  NPRS scale: 1/10 upon arrival Pain location:Right shoulder Pain description: achey Aggravating factors: lifting arm Relieving factors: rest   PRECAUTIONS: ,  right shoulder s/p arthroplasty--no lifting >5 lbs and no external rotation past 40 degrees   RED FLAGS: None   WEIGHT BEARING RESTRICTIONS:  No  FALLS:  Has patient fallen in last 6 months? Yes. Number of falls 1, she tripped in parking lot, was scraped up but did not appear to have any significant injury   OCCUPATION:  none  PLOF:  Independent  PATIENT GOALS:  Improve ability to use her right arm and reach up  OBJECTIVE:  Note: Objective measures were completed at Evaluation unless otherwise noted.   PATIENT  SURVEYS:  Patient-Specific Activity Scoring Scheme  0 represents "unable to perform." 10 represents "able to perform at prior level. 0 1 2 3 4 5 6 7 8 9  10 (Date and Score)   Activity Eval     1. Reaching overhead 0     2. Washing hair 3    3. Driving 0   4.    5.    Score 1/10    Total score = sum of the activity scores/number of activities Minimum detectable change (90%CI) for average score = 2 points Minimum detectable change (90%CI) for single activity score = 3 points     EDEMA:  No   Shoulder  PALPATION: Scar is healing well with no sign of infection.   UPPER EXTREMITY ROM:  Active ROM/PROM Right eval Left eval  Shoulder flexion 60/140   Shoulder extension    Shoulder abduction 30/140   Shoulder adduction    Shoulder extension    Shoulder internal rotation Right SIJ  reaching behind back/WFL   Shoulder external rotation 15 /40   Elbow flexion    Elbow extension    Wrist flexion    Wrist extension    Wrist ulnar deviation    Wrist radial deviation    Wrist pronation    Wrist supination     (Blank rows = not tested)   UPPER EXTREMITY MMT:  MMT Right eval Left eval  Shoulder flexion 2   Shoulder extension    Shoulder abduction 2   Shoulder adduction    Shoulder extension    Shoulder internal rotation 2   Shoulder external rotation 2   Middle trapezius    Lower trapezius    Elbow flexion    Elbow extension    Wrist flexion    Wrist extension    Wrist ulnar deviation    Wrist radial deviation    Wrist pronation    Wrist supination    Grip strength     (Blank rows = not tested)    FUNCTIONAL TESTS:  02/13/24: Grip strength HHD Rt: 35# Lt:20#  TREATMENT DATE:  02/17/24 Thereex UBE L1 6 min total switch halfway Pendulums X 15 CW,CCW Sidelying shoulder ER X 1# 10 reps Sidelying shoulder abduction 1# X 10  reps Seated AAROM flexion, abduction, ER, X 10 each   Theractivity Wall ladder X 10 reps flexion, X 5 reps abduction UE ranger X 10 flexion, X 10 circles CW,CCW P ball roll up wall for shoulder flexion, 2 X 5 reps Standing body blade 20 sec X 4 A-P, IR/ER Supine shoulder flexion 1# 2X 5 reps Supine chest press 2# 2X 5 reps Supine circles at 90 deg 2# X 15 CW, X 15 CCW  02/15/24 Thereex UBE L1 5 min total switch halfway Pendulums X 15 CW,CCW Sidelying shoulder ER X 10 reps Sidelying shoulder abduction X 10 reps   Theractivity Wall ladder X 10 reps flexion, X 5 reps abduction UE ranger X 10 flexion, X 10 circles CW,CCW P ball roll up wall for shoulder flexion, 2 X 5 reps Supine shoulder flexion 1# 2X 5 reps Supine chest press 2# 2X 5 reps Supine ER and IR against manual resistance X 10 reps each  Supine circles at 90 deg 1# X 10 CW, X 10 CCW  Manual:  Right shoulder PROM gentle to tolerance all planes, keeping ER to 40 deg limit for now per surgical protocol.  Eval HEP creation and review with demonstration and trial set preformed, see below for details Right shoulder PROM gentle to tolerance all planes, keeping ER to 40 deg limit for now per surgical protocol.  Selfcare:importance of protein intake to improve strength post surgically, scar massage for right shoulder with vitamin E lotion or cocoa butter, wound care management for left side as well as advice to seek Xray if she is in pain or inability to weight bear on left leg or arm due to recent fall on that side.     PATIENT EDUCATION: Education details: HEP, PT plan of care, selfcare Person educated: Patient Education method: Explanation, Demonstration, Verbal cues, and Handouts Education comprehension: verbalized understanding, further education recommended   HOME EXERCISE PROGRAM: Access Code: [email or web address] URL: [email or web address] Date: 02/13/2024 Prepared by: Redell Moose  Exercises - Shoulder  Flexion Overhead with Dowel (Mirrored)  - 2 x daily - 6 x weekly - 1 sets - 15 reps - Shoulder Scaption AAROM with Dowel  - 2 x daily - 6 x weekly - 1 sets - 15 reps - Standing Shoulder Internal Rotation AAROM with Dowel (Mirrored)  - 2 x daily - 6 x weekly - 1 sets - 15 reps - Standing Isometric Shoulder Internal Rotation at Doorway  - 2 x daily - 6 x weekly - 1 sets - 10 reps - 5 hold - Standing Isometric Shoulder External Rotation with Doorway (Mirrored)  - 2 x daily - 6 x weekly - 1 sets - 10 reps - 5 hold - Isometric Shoulder Flexion at Wall  - 2 x daily - 6 x weekly - 1 sets - 10 reps - 5 hold - Isometric Shoulder Extension at Wall  - 2 x daily - 6 x weekly - 1 sets - 10 reps - 5 sec hold - Standing Isometric Shoulder Abduction with Doorway - Arm Bent (Mirrored)  - 2 x daily - 6 x weekly - 1 sets - 10 reps  ASSESSMENT:  CLINICAL IMPRESSION:  She showed some early progress with lifting arm against gravity today, it was better but still limited as expected and we willl work to improve this as tolerated.  OBJECTIVE IMPAIRMENTS: decreased activity tolerance for ADL's,  decreased shoulder mobility, decreased shoulder ROM, decreased strength, impaired flexibility, impaired UE use, and pain.  ACTIVITY LIMITATIONS:, lifting, carry, reaching,, cleaning,driving, and or occupation  PERSONAL FACTORS: DM, ARF also affecting patient's functional outcome.  REHAB POTENTIAL: Good  CLINICAL DECISION MAKING: Low  EVALUATION COMPLEXITY: Low    GOALS: Short term PT Goals Target date: 03/12/2024   Pt will be I and compliant with HEP. Baseline:  Goal status: New Pt will improve right shoulder ROM flexion to at least 90 degrees Baseline: Goal status: New  Long term PT goals Target date:04/09/2024   Pt will improve shoulder AROM to [facility] to improve functional mobility Baseline: Goal status: New Pt will improve shoulder strength to at least 4/5 MMT to improve functional strength Baseline: Goal  status: New Pt will improve Patient specific functional scale (PSFS) to at least 6/10 to show improved function level Baseline:1/10 Goal status: New Pt will reduce pain to overall less than 3/10 with usual activity and ADL's Baseline: Goal status: New  PLAN: PT FREQUENCY: 2-3 times per week   PT DURATION: 6-8 weeks  PLANNED INTERVENTIONS (unless contraindicated):  97110-Therapeutic exercises, 97530- Therapeutic activity, [id number]- Neuromuscular re-education, 97535- Self Care, 02859- Manual therapy, G0283- Electrical stimulation (unattended), 97016- Vasopneumatic device, 97035- Ultrasound, 02966- Ionotophoresis 4mg /ml Dexamethasone , Joint mobilization, and Joint manipulation  PLAN FOR NEXT SESSION: -- Monitor for soreness.  no lifting >5 lbs and no external rotation past 40 degrees until NEXT MD VISIT: 02/22/24  Redell JONELLE Moose, PT,DPT 02/17/2024, 9:34 AM

## 2024-02-20 ENCOUNTER — Ambulatory Visit: Admitting: Physical Therapy

## 2024-02-20 ENCOUNTER — Encounter: Payer: Self-pay | Admitting: Physical Therapy

## 2024-02-20 DIAGNOSIS — M25511 Pain in right shoulder: Secondary | ICD-10-CM

## 2024-02-20 DIAGNOSIS — M6281 Muscle weakness (generalized): Secondary | ICD-10-CM

## 2024-02-20 NOTE — Therapy (Signed)
 OUTPATIENT PHYSICAL THERAPY TREATMENT Progress Note reporting period 02/13/24 to 02/20/24  See below for objective and subjective measurements relating to patients progress with PT.    Patient Name: Meghan [name] MRN: [id number] DOB:Sep 20, 1955, 68 y.o., female Today's Date: 02/20/2024  END OF SESSION:  PT End of Session - 02/20/24 1118     Visit Number 4    Number of Visits 18    Date for PT Re-Evaluation 04/09/24    Progress Note Due on Visit 14    PT Start Time 1040    PT Stop Time 1133    PT Time Calculation (min) 53 min    Activity Tolerance Patient tolerated treatment well    Behavior During Therapy [facility] for tasks assessed/performed          Past Medical History:  Diagnosis Date   Anemia 2020   iron infusions   Anxiety    Arthritis    Cellulitis 06/30/2012   RLE   Depression    Diabetic peripheral neuropathy (HCC)    GERD (gastroesophageal reflux disease)    Headache    High cholesterol    Hypertension    Seasonal allergies    Type II diabetes mellitus (HCC)    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   Past Surgical History:  Procedure Laterality Date   AMPUTATION  ~ 2008   2nd toe of right foot (06/30/2012)   AMPUTATION  07/04/2012   Procedure: AMPUTATION RAY;  Surgeon: Jerona LULLA Sage, MD;  Location: [facility] OR;  Service: Orthopedics;  Laterality: Right;  great toe  amp vs ray amp   AMPUTATION Left 02/13/2021   Procedure: LEFT FOOT 3RD AND 4TH RAY AMPUTATION;  Surgeon: Sage Jerona LULLA, MD;  Location: [facility] OR;  Service: Orthopedics;  Laterality: Left;   BICEPT TENODESIS Right 12/27/2023   Procedure: BICEPS TENODESIS;  Surgeon: Addie Cordella Hamilton, MD;  Location: [facility];  Service: Orthopedics;  Laterality: Right;   CARPAL TUNNEL RELEASE Right    04-25-2023 Dr. Addie [facility])   INCISION AND DRAINAGE OF WOUND  03/10/2007   mediastinitis, left medial collarbone (06/30/2012)   LYMPHADENECTOMY  08/09/2005   bilateral groins; had cancer vulva (06/30/2012)    REVERSE SHOULDER ARTHROPLASTY Right 12/27/2023   Procedure: REVERSE SHOULDER ARTHROPLASTY;  Surgeon: Addie Cordella Hamilton, MD;  Location: [facility] OR;  Service: Orthopedics;  Laterality: Right;   TUBAL LIGATION     VULVA SURGERY  08/09/2005   took cancer off (06/30/2012)   WISDOM TOOTH EXTRACTION     Patient Active Problem List   Diagnosis Date Noted   Arthritis of right shoulder 01/21/2024   S/P reverse total shoulder arthroplasty, 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   Osteomyelitis of third toe of left foot (HCC)    History of gout 06/30/2012   DM (diabetes mellitus), type 2, uncontrolled, periph vascular complic 06/30/2012   HTN (hypertension) 06/30/2012   Cellulitis of leg 06/30/2012   ARF (acute renal failure) (HCC) 06/30/2012    PCP: Rosamond Leta NOVAK, MD   REFERRING PROVIDER: Rosamond Leta NOVAK, MD   REFERRING DIAG: [id number] (ICD-10-CM) - Status post reverse total replacement of right shoulder   Rationale for Evaluation and Treatment:  Rehabiliation  THERAPY DIAG:  Acute pain of right shoulder  Muscle weakness (generalized)  ONSET DATE: s/p right reverse shoulder arthroplasty on 12/27/2023    SUBJECTIVE:  SUBJECTIVE STATEMENT: Some soreness today but not bad PERTINENT HISTORY:  See above PMH  PAIN:  NPRS scale: 1/10 upon arrival Pain location:Right shoulder Pain description: achey Aggravating factors: lifting arm Relieving factors: rest   PRECAUTIONS: ,  right shoulder s/p arthroplasty--no lifting >5 lbs and no external rotation past 40 degrees   RED FLAGS: None   WEIGHT BEARING RESTRICTIONS:  No  FALLS:  Has patient fallen in last 6 months? Yes. Number of falls 1, she tripped in parking lot, was scraped up but did not appear to have any significant injury   OCCUPATION:  none  PLOF:   Independent  PATIENT GOALS:  Improve ability to use her right arm and reach up  OBJECTIVE:  Note: Objective measures were completed at Evaluation unless otherwise noted.   PATIENT SURVEYS:  Patient-Specific Activity Scoring Scheme  0 represents "unable to perform." 10 represents "able to perform at prior level. 0 1 2 3 4 5 6 7 8 9  10 (Date and Score)   Activity Eval     1. Reaching overhead 0     2. Washing hair 3    3. Driving 0   4.    5.    Score 1/10    Total score = sum of the activity scores/number of activities Minimum detectable change (90%CI) for average score = 2 points Minimum detectable change (90%CI) for single activity score = 3 points     EDEMA:  No   Shoulder  PALPATION: Scar is healing well with no sign of infection.   UPPER EXTREMITY ROM:  Active ROM/PROM Right eval Right 02/20/24 eval  Shoulder flexion 60/140 90/  Shoulder extension    Shoulder abduction 30/140 60/  Shoulder adduction    Shoulder extension    Shoulder internal rotation Right SIJ  reaching behind back/WFL L4 behind back  Shoulder external rotation 15 /40 30/  Elbow flexion    Elbow extension    Wrist flexion    Wrist extension    Wrist ulnar deviation    Wrist radial deviation    Wrist pronation    Wrist supination     (Blank rows = not tested)   UPPER EXTREMITY MMT:  MMT Right eval Right 02/20/23  Shoulder flexion 2 3-  Shoulder extension    Shoulder abduction 2 3-  Shoulder adduction    Shoulder extension    Shoulder internal rotation 2 4  Shoulder external rotation 2 3-  Middle trapezius    Lower trapezius    Elbow flexion    Elbow extension    Wrist flexion    Wrist extension    Wrist ulnar deviation    Wrist radial deviation    Wrist pronation    Wrist supination    Grip strength     (Blank rows = not tested)    FUNCTIONAL TESTS:  02/13/24: Grip strength HHD Rt: 35# Lt:20#  TREATMENT DATE:  02/20/24 Thereex UBE L1 7 min total switch halfway Pendulums X 15 CW,CCW Sidelying shoulder ER X 1# 10 reps Sidelying shoulder abduction 1# X 10 reps   Theractivity Wall ladder X 10 reps flexion, X 5 reps abduction UE ranger X 10 flexion, X 10 circles CW,CCW P ball roll up wall for shoulder flexion, 2 X 5 reps Standing body blade 20 sec X 4 A-P, IR/ER Supine shoulder flexion 2# 2X 5 reps Supine chest press 3# 2X 5 reps Supine circles at 90 deg 2# X 15 CW, X 15 CCW Supine scapular protraciton 2# X 10  02/17/24 Thereex UBE L1 6 min total switch halfway Pendulums X 15 CW,CCW Sidelying shoulder ER X 1# 10 reps Sidelying shoulder abduction 1# X 10 reps Seated AAROM flexion, abduction, ER, X 10 each   Theractivity Wall ladder X 10 reps flexion, X 5 reps abduction UE ranger X 10 flexion, X 10 circles CW,CCW P ball roll up wall for shoulder flexion, 2 X 5 reps Standing body blade 20 sec X 4 A-P, IR/ER Supine shoulder flexion 1# 2X 5 reps Supine chest press 2# 2X 5 reps Supine circles at 90 deg 2# X 15 CW, X 15 CCW      PATIENT EDUCATION: Education details: HEP, PT plan of care, selfcare Person educated: Patient Education method: Explanation, Demonstration, Verbal cues, and Handouts Education comprehension: verbalized understanding, further education recommended   HOME EXERCISE PROGRAM: Access Code: [email or web address] URL: [email or web address] Date: 02/13/2024 Prepared by: Redell Moose  Exercises - Shoulder Flexion Overhead with Dowel (Mirrored)  - 2 x daily - 6 x weekly - 1 sets - 15 reps - Shoulder Scaption AAROM with Dowel  - 2 x daily - 6 x weekly - 1 sets - 15 reps - Standing Shoulder Internal Rotation AAROM with Dowel (Mirrored)  - 2 x daily - 6 x weekly - 1 sets - 15 reps - Standing Isometric Shoulder Internal Rotation at Doorway  - 2 x daily - 6 x weekly - 1 sets - 10 reps - 5  hold - Standing Isometric Shoulder External Rotation with Doorway (Mirrored)  - 2 x daily - 6 x weekly - 1 sets - 10 reps - 5 hold - Isometric Shoulder Flexion at Wall  - 2 x daily - 6 x weekly - 1 sets - 10 reps - 5 hold - Isometric Shoulder Extension at Wall  - 2 x daily - 6 x weekly - 1 sets - 10 reps - 5 sec hold - Standing Isometric Shoulder Abduction with Doorway - Arm Bent (Mirrored)  - 2 x daily - 6 x weekly - 1 sets - 10 reps  ASSESSMENT:  CLINICAL IMPRESSION:  Progress note reflects that she is making good early progress with PT. She showed improvements with updated AROM and strength measurements today. She has excellent ROM available passively but is limited actively in ROM due to weakness. Again this is expected after surgery and we will continue to work to progress this as tolerated to improve functional use of her Rt arm. She has now met her short term PT goals and we will continue to work towards her long term goals.   OBJECTIVE IMPAIRMENTS: decreased activity tolerance for ADL's,  decreased shoulder mobility, decreased shoulder ROM, decreased strength, impaired flexibility, impaired UE use, and pain.  ACTIVITY LIMITATIONS:, lifting, carry, reaching,, cleaning,driving, and or occupation  PERSONAL FACTORS: DM, ARF also affecting patient's functional outcome.  REHAB POTENTIAL: Good  CLINICAL DECISION MAKING: Low  EVALUATION COMPLEXITY: Low    GOALS: Short term PT Goals Target date: 03/12/2024   Pt will be I and compliant with HEP. Baseline:  Goal status: MET 02/20/24 Pt will improve right shoulder ROM flexion to at least 90 degrees Baseline: Goal status: MET 02/20/24  Long term PT goals Target date:04/09/2024   Pt will improve shoulder AROM to [facility] to improve functional mobility Baseline: Goal status: ongoing 02/20/24 Pt will improve shoulder strength to at least 4/5 MMT to improve functional strength Baseline: Goal status: ongoing 02/20/24 Pt will improve Patient  specific functional scale (PSFS) to at least 6/10 to show improved function level Baseline:1/10 Goal status: ongoing 02/20/24 Pt will reduce pain to overall less than 3/10 with usual activity and ADL's Baseline: Goal status: ongoing 02/20/24  PLAN: PT FREQUENCY: 2-3 times per week   PT DURATION: 6-8 weeks  PLANNED INTERVENTIONS (unless contraindicated):  97110-Therapeutic exercises, 97530- Therapeutic activity, 97112- Neuromuscular re-education, 97535- Self Care, 02859- Manual therapy, [id number]- Electrical stimulation (unattended), 97016- Vasopneumatic device, 97035- Ultrasound, 02966- Ionotophoresis 4mg /ml Dexamethasone , Joint mobilization, and Joint manipulation  PLAN FOR NEXT SESSION: -- Monitor for soreness.  no lifting >5 lbs and no external rotation past 40 degrees until NEXT MD VISIT: 02/22/24  Redell JONELLE Moose, PT,DPT 02/20/2024, 11:35 AM

## 2024-02-22 ENCOUNTER — Encounter: Payer: Self-pay | Admitting: Physical Therapy

## 2024-02-22 ENCOUNTER — Ambulatory Visit: Admitting: Physical Therapy

## 2024-02-22 ENCOUNTER — Ambulatory Visit (INDEPENDENT_AMBULATORY_CARE_PROVIDER_SITE_OTHER): Admitting: Surgical

## 2024-02-22 ENCOUNTER — Encounter: Payer: Self-pay | Admitting: Surgical

## 2024-02-22 DIAGNOSIS — M25511 Pain in right shoulder: Secondary | ICD-10-CM

## 2024-02-22 DIAGNOSIS — Z96611 Presence of right artificial shoulder joint: Secondary | ICD-10-CM

## 2024-02-22 DIAGNOSIS — M6281 Muscle weakness (generalized): Secondary | ICD-10-CM

## 2024-02-22 NOTE — Progress Notes (Signed)
 Post-Op Visit Note   Patient: Meghan [name]           Date of Birth: [birth year]           MRN: [id number] Visit Date: 02/22/2024 PCP: Rosamond Leta NOVAK, MD   Assessment & Plan:  Chief Complaint: No chief complaint on file.  Visit Diagnoses: No diagnosis found.  Plan: Patient is a 68 year old female who presents s/p right reverse shoulder arthroplasty in 5/20/205.  She is doing well overall.  Pain is much improved.  She is working in physical therapy in [place] and this is going well.  Still having some difficulty with any sort of shoulder level of motion.  On exam, has 50 degrees X rotation, 70 degrees abduction, 135 degrees forward elevation passively.  Axillary nerve is intact with deltoid firing.  She has predictable external rotation weakness considering her history of irreparable rotator cuff tear.  Incision is well-healed.  2+ radial pulse of the operative extremity.  Plan at this time is follow-up in 2 months for likely final check.  Keep working with physical therapy in the meantime to focus on achieving as much active range of motion of the operative shoulder as possible.  Okay for full range of motion at this point except I would not push the issue on going behind the back yet.  Follow-Up Instructions: Return in about 6 weeks (around 04/04/2024).   Orders:  No orders of the defined types were placed in this encounter.  No orders of the defined types were placed in this encounter.   Imaging: No results found.  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   Osteomyelitis of third toe of left foot (HCC)    History of gout 06/30/2012   DM (diabetes mellitus), type 2, uncontrolled, periph vascular complic 06/30/2012   HTN (hypertension) 06/30/2012   Cellulitis of leg 06/30/2012   ARF (acute renal failure) (HCC) 06/30/2012   Past Medical History:   Diagnosis Date   Anemia 2020   iron infusions   Anxiety    Arthritis    Cellulitis 06/30/2012   RLE   Depression    Diabetic peripheral neuropathy (HCC)    GERD (gastroesophageal reflux disease)    Headache    High cholesterol    Hypertension    Seasonal allergies    Type II diabetes mellitus (HCC)    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    Family History  Adopted: Yes  Problem Relation Age of Onset   Dementia Mother    Heart attack Father     Past Surgical History:  Procedure Laterality Date   AMPUTATION  ~ 2008   2nd toe of right foot (06/30/2012)   AMPUTATION  07/04/2012   Procedure: AMPUTATION RAY;  Surgeon: Jerona LULLA Sage, MD;  Location: [facility] OR;  Service: Orthopedics;  Laterality: Right;  great toe  amp vs ray amp   AMPUTATION Left 02/13/2021   Procedure: LEFT FOOT 3RD AND 4TH RAY AMPUTATION;  Surgeon: Sage Jerona LULLA, MD;  Location: [facility] OR;  Service: Orthopedics;  Laterality: Left;   BICEPT TENODESIS Right 12/27/2023   Procedure: BICEPS TENODESIS;  Surgeon: Addie Cordella Hamilton, MD;  Location: [facility] OR;  Service: Orthopedics;  Laterality: Right;   CARPAL TUNNEL RELEASE Right    04-25-2023 Dr. Addie [facility])   INCISION AND DRAINAGE OF WOUND  03/10/2007   mediastinitis, left medial collarbone (  06/30/2012)   LYMPHADENECTOMY  08/09/2005   bilateral groins; had cancer vulva (06/30/2012)   REVERSE SHOULDER ARTHROPLASTY Right 12/27/2023   Procedure: REVERSE SHOULDER ARTHROPLASTY;  Surgeon: Addie Cordella Hamilton, MD;  Location: [facility] OR;  Service: Orthopedics;  Laterality: Right;   TUBAL LIGATION     VULVA SURGERY  08/09/2005   took cancer off (06/30/2012)   WISDOM TOOTH EXTRACTION     Social History   Occupational History   Not on file  Tobacco Use   Smoking status: Former    Current packs/day: 0.00    Types: Cigarettes    Quit date: 11/1998    Years since quitting: 25.3   Smokeless tobacco: Never   Tobacco comments:    06/30/2012 quit cigarette smoking in 1999   Vaping Use   Vaping status: Never Used  Substance and Sexual Activity   Alcohol use: Not Currently   Drug use: Yes    Frequency: 3.0 times per week    Types: Marijuana   Sexual activity: Not Currently    Birth control/protection: Post-menopausal

## 2024-02-22 NOTE — Therapy (Signed)
 OUTPATIENT PHYSICAL THERAPY TREATMENT  Patient Name: Meghan [name] MRN: [id number] DOB:10/17/1955, 68 y.o., female Today's Date: 02/22/2024  END OF SESSION:  PT End of Session - 02/22/24 1339     Visit Number 5    Number of Visits 18    Date for PT Re-Evaluation 04/09/24    Progress Note Due on Visit 14    PT Start Time 1335    PT Stop Time 1420    PT Time Calculation (min) 45 min    Activity Tolerance Patient tolerated treatment well    Behavior During Therapy [facility] for tasks assessed/performed          Past Medical History:  Diagnosis Date   Anemia 2020   iron infusions   Anxiety    Arthritis    Cellulitis 06/30/2012   RLE   Depression    Diabetic peripheral neuropathy (HCC)    GERD (gastroesophageal reflux disease)    Headache    High cholesterol    Hypertension    Seasonal allergies    Type II diabetes mellitus (HCC)    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   Past Surgical History:  Procedure Laterality Date   AMPUTATION  ~ 2008   2nd toe of right foot (06/30/2012)   AMPUTATION  07/04/2012   Procedure: AMPUTATION RAY;  Surgeon: Jerona LULLA Sage, MD;  Location: [facility] OR;  Service: Orthopedics;  Laterality: Right;  great toe  amp vs ray amp   AMPUTATION Left 02/13/2021   Procedure: LEFT FOOT 3RD AND 4TH RAY AMPUTATION;  Surgeon: Sage Jerona LULLA, MD;  Location: [facility] OR;  Service: Orthopedics;  Laterality: Left;   BICEPT TENODESIS Right 12/27/2023   Procedure: BICEPS TENODESIS;  Surgeon: Addie Cordella Hamilton, MD;  Location: [facility] OR;  Service: Orthopedics;  Laterality: Right;   CARPAL TUNNEL RELEASE Right    04-25-2023 Dr. Addie [facility])   INCISION AND DRAINAGE OF WOUND  03/10/2007   mediastinitis, left medial collarbone (06/30/2012)   LYMPHADENECTOMY  08/09/2005   bilateral groins; had cancer vulva (06/30/2012)   REVERSE SHOULDER ARTHROPLASTY Right 12/27/2023   Procedure: REVERSE SHOULDER ARTHROPLASTY;  Surgeon: Addie Cordella Hamilton, MD;  Location: [facility] OR;  Service:  Orthopedics;  Laterality: Right;   TUBAL LIGATION     VULVA SURGERY  08/09/2005   took cancer off (06/30/2012)   WISDOM TOOTH EXTRACTION     Patient Active Problem List   Diagnosis Date Noted   Arthritis of right shoulder 01/21/2024   S/P reverse total shoulder arthroplasty, 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   Osteomyelitis of third toe of left foot (HCC)    History of gout 06/30/2012   DM (diabetes mellitus), type 2, uncontrolled, periph vascular complic 06/30/2012   HTN (hypertension) 06/30/2012   Cellulitis of leg 06/30/2012   ARF (acute renal failure) (HCC) 06/30/2012    PCP: Rosamond Leta NOVAK, MD   REFERRING PROVIDER: Rosamond Leta NOVAK, MD   REFERRING DIAG: [phone] (ICD-10-CM) - Status post reverse total replacement of right shoulder   Rationale for Evaluation and Treatment:  Rehabiliation  THERAPY DIAG:  Acute pain of right shoulder  Muscle weakness (generalized)  ONSET DATE: s/p right reverse shoulder arthroplasty on 12/27/2023    SUBJECTIVE:  SUBJECTIVE STATEMENT: Some reports good follow up from PA today.  PERTINENT HISTORY:  See above PMH  PAIN:  NPRS scale: 1/10 upon arrival Pain location:Right shoulder Pain description: achey Aggravating factors: lifting arm Relieving factors: rest   PRECAUTIONS: ,  right shoulder s/p arthroplasty--no lifting >5 lbs and no external rotation past 40 degrees   RED FLAGS: None   WEIGHT BEARING RESTRICTIONS:  No  FALLS:  Has patient fallen in last 6 months? Yes. Number of falls 1, she tripped in parking lot, was scraped up but did not appear to have any significant injury   OCCUPATION:  none  PLOF:  Independent  PATIENT GOALS:  Improve ability to use her right arm and reach up  OBJECTIVE:  Note: Objective measures were completed at Evaluation  unless otherwise noted.   PATIENT SURVEYS:  Patient-Specific Activity Scoring Scheme  0 represents "unable to perform." 10 represents "able to perform at prior level. 0 1 2 3 4 5 6 7 8 9  10 (Date and Score)   Activity Eval     1. Reaching overhead 0     2. Washing hair 3    3. Driving 0   4.    5.    Score 1/10    Total score = sum of the activity scores/number of activities Minimum detectable change (90%CI) for average score = 2 points Minimum detectable change (90%CI) for single activity score = 3 points     EDEMA:  No   Shoulder  PALPATION: Scar is healing well with no sign of infection.   UPPER EXTREMITY ROM:  Active ROM/PROM Right eval Right 02/20/24 eval  Shoulder flexion 60/140 90/  Shoulder extension    Shoulder abduction 30/140 60/  Shoulder adduction    Shoulder extension    Shoulder internal rotation Right SIJ  reaching behind back/WFL L4 behind back  Shoulder external rotation 15 /40 30/  Elbow flexion    Elbow extension    Wrist flexion    Wrist extension    Wrist ulnar deviation    Wrist radial deviation    Wrist pronation    Wrist supination     (Blank rows = not tested)   UPPER EXTREMITY MMT:  MMT Right eval Right 02/20/23  Shoulder flexion 2 3-  Shoulder extension    Shoulder abduction 2 3-  Shoulder adduction    Shoulder extension    Shoulder internal rotation 2 4  Shoulder external rotation 2 3-  Middle trapezius    Lower trapezius    Elbow flexion    Elbow extension    Wrist flexion    Wrist extension    Wrist ulnar deviation    Wrist radial deviation    Wrist pronation    Wrist supination    Grip strength     (Blank rows = not tested)    FUNCTIONAL TESTS:  02/13/24: Grip strength HHD Rt: 35# Lt:20#  TREATMENT DATE:  02/22/24 Thereex UBE L1 7 min total switch halfway Standing body  blade 20 sec X 43, A-P, IR/ER  Theractivity Wall ladder X 10 reps flexion, X 5 reps abduction UE ranger X 10 flexion, X 10 circles CW,CCW P ball roll up wall for shoulder flexion, 2 X 5 reps Seated shoulder flexion AAROM with wand horizontal X 5 reps, then wand vertical X 10 reps Seated shoulder abduction AAROM 5 sec X 10 Seated blaze pods 30 sec X 3 reaching up and out to turn off pods (3 total) with Rt UE Seated push/pull strength against PT resistance X 10 each Seated IR/ER strength against PT resistance X 10 each  02/20/24 Thereex UBE L1 7 min total switch halfway Pendulums X 15 CW,CCW Sidelying shoulder ER X 1# 10 reps Sidelying shoulder abduction 1# X 10 reps   Theractivity Wall ladder X 10 reps flexion, X 5 reps abduction UE ranger X 10 flexion, X 10 circles CW,CCW P ball roll up wall for shoulder flexion, 2 X 5 reps Standing body blade 20 sec X 4 A-P, IR/ER Supine shoulder flexion 2# 2X 5 reps Supine chest press 3# 2X 5 reps Supine circles at 90 deg 2# X 15 CW, X 15 CCW Supine scapular protraciton 2# X 10    PATIENT EDUCATION: Education details: HEP, PT plan of care, selfcare Person educated: Patient Education method: Explanation, Demonstration, Verbal cues, and Handouts Education comprehension: verbalized understanding, further education recommended   HOME EXERCISE PROGRAM: Access Code: [email or web address] URL: [email or web address] Date: 02/13/2024 Prepared by: Redell Moose  Exercises - Shoulder Flexion Overhead with Dowel (Mirrored)  - 2 x daily - 6 x weekly - 1 sets - 15 reps - Shoulder Scaption AAROM with Dowel  - 2 x daily - 6 x weekly - 1 sets - 15 reps - Standing Shoulder Internal Rotation AAROM with Dowel (Mirrored)  - 2 x daily - 6 x weekly - 1 sets - 15 reps - Standing Isometric Shoulder Internal Rotation at Doorway  - 2 x daily - 6 x weekly - 1 sets - 10 reps - 5 hold - Standing Isometric Shoulder External Rotation with Doorway (Mirrored)  - 2 x  daily - 6 x weekly - 1 sets - 10 reps - 5 hold - Isometric Shoulder Flexion at Wall  - 2 x daily - 6 x weekly - 1 sets - 10 reps - 5 hold - Isometric Shoulder Extension at Wall  - 2 x daily - 6 x weekly - 1 sets - 10 reps - 5 sec hold - Standing Isometric Shoulder Abduction with Doorway - Arm Bent (Mirrored)  - 2 x daily - 6 x weekly - 1 sets - 10 reps  ASSESSMENT:  CLINICAL IMPRESSION:  Overall continues to show good effort with PT and makes progress each week..   OBJECTIVE IMPAIRMENTS: decreased activity tolerance for ADL's,  decreased shoulder mobility, decreased shoulder ROM, decreased strength, impaired flexibility, impaired UE use, and pain.  ACTIVITY LIMITATIONS:, lifting, carry, reaching,, cleaning,driving, and or occupation  PERSONAL FACTORS: DM, ARF also affecting patient's functional outcome.  REHAB POTENTIAL: Good  CLINICAL DECISION MAKING: Low  EVALUATION COMPLEXITY: Low    GOALS: Short term PT Goals Target date: 03/12/2024   Pt will be I and compliant with HEP. Baseline:  Goal status: MET 02/20/24 Pt will improve right shoulder ROM flexion to at least 90 degrees Baseline: Goal status: MET 02/20/24  Long term PT goals Target date:04/09/2024   Pt will improve shoulder AROM  to [facility] to improve functional mobility Baseline: Goal status: ongoing 02/20/24 Pt will improve shoulder strength to at least 4/5 MMT to improve functional strength Baseline: Goal status: ongoing 02/20/24 Pt will improve Patient specific functional scale (PSFS) to at least 6/10 to show improved function level Baseline:1/10 Goal status: ongoing 02/20/24 Pt will reduce pain to overall less than 3/10 with usual activity and ADL's Baseline: Goal status: ongoing 02/20/24  PLAN: PT FREQUENCY: 2-3 times per week   PT DURATION: 6-8 weeks  PLANNED INTERVENTIONS (unless contraindicated):  97110-Therapeutic exercises, 97530- Therapeutic activity, 97112- Neuromuscular re-education, 97535- Self Care,  97140- Manual therapy, G0283- Electrical stimulation (unattended), 97016- Vasopneumatic device, 97035- Ultrasound, 02966- Ionotophoresis 4mg /ml Dexamethasone , Joint mobilization, and Joint manipulation  PLAN FOR NEXT SESSION:  Blaze pods Monitor for soreness.  no lifting >5 lbs and no external rotation past 40 degrees until NEXT MD VISIT: 02/22/24  Redell JONELLE Moose, PT,DPT 02/22/2024, 2:23 PM

## 2024-02-24 ENCOUNTER — Ambulatory Visit: Admitting: Physical Therapy

## 2024-02-27 ENCOUNTER — Ambulatory Visit: Admitting: Physical Therapy

## 2024-02-27 ENCOUNTER — Encounter: Payer: Self-pay | Admitting: Physical Therapy

## 2024-02-27 DIAGNOSIS — M25511 Pain in right shoulder: Secondary | ICD-10-CM

## 2024-02-27 DIAGNOSIS — M6281 Muscle weakness (generalized): Secondary | ICD-10-CM

## 2024-02-27 NOTE — Therapy (Signed)
 OUTPATIENT PHYSICAL THERAPY TREATMENT  Patient Name: Meghan [name] MRN: [id number] DOB:February 16, 1956, 68 y.o., female Today's Date: 02/27/2024  END OF SESSION:  PT End of Session - 02/27/24 1136     Visit Number 6    Number of Visits 18    Date for PT Re-Evaluation 04/09/24    Progress Note Due on Visit 14    PT Start Time 1045    PT Stop Time 1135    PT Time Calculation (min) 50 min    Activity Tolerance Patient tolerated treatment well    Behavior During Therapy [facility] for tasks assessed/performed           Past Medical History:  Diagnosis Date   Anemia 2020   iron infusions   Anxiety    Arthritis    Cellulitis 06/30/2012   RLE   Depression    Diabetic peripheral neuropathy (HCC)    GERD (gastroesophageal reflux disease)    Headache    High cholesterol    Hypertension    Seasonal allergies    Type II diabetes mellitus (HCC)    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   Past Surgical History:  Procedure Laterality Date   AMPUTATION  ~ 2008   2nd toe of right foot (06/30/2012)   AMPUTATION  07/04/2012   Procedure: AMPUTATION RAY;  Surgeon: Jerona LULLA Sage, MD;  Location: [facility] OR;  Service: Orthopedics;  Laterality: Right;  great toe  amp vs ray amp   AMPUTATION Left 02/13/2021   Procedure: LEFT FOOT 3RD AND 4TH RAY AMPUTATION;  Surgeon: Sage Jerona LULLA, MD;  Location: [facility] OR;  Service: Orthopedics;  Laterality: Left;   BICEPT TENODESIS Right 12/27/2023   Procedure: BICEPS TENODESIS;  Surgeon: Addie Cordella Hamilton, MD;  Location: [facility] OR;  Service: Orthopedics;  Laterality: Right;   CARPAL TUNNEL RELEASE Right    04-25-2023 Dr. Addie [facility])   INCISION AND DRAINAGE OF WOUND  03/10/2007   mediastinitis, left medial collarbone (06/30/2012)   LYMPHADENECTOMY  08/09/2005   bilateral groins; had cancer vulva (06/30/2012)   REVERSE SHOULDER ARTHROPLASTY Right 12/27/2023   Procedure: REVERSE SHOULDER ARTHROPLASTY;  Surgeon: Addie Cordella Hamilton, MD;  Location: [facility] OR;   Service: Orthopedics;  Laterality: Right;   TUBAL LIGATION     VULVA SURGERY  08/09/2005   took cancer off (06/30/2012)   WISDOM TOOTH EXTRACTION     Patient Active Problem List   Diagnosis Date Noted   Arthritis of right shoulder 01/21/2024   S/P reverse total shoulder arthroplasty, 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   Osteomyelitis of third toe of left foot (HCC)    History of gout 06/30/2012   DM (diabetes mellitus), type 2, uncontrolled, periph vascular complic 06/30/2012   HTN (hypertension) 06/30/2012   Cellulitis of leg 06/30/2012   ARF (acute renal failure) (HCC) 06/30/2012    PCP: Rosamond Leta NOVAK, MD   REFERRING PROVIDER: Rosamond Leta NOVAK, MD   REFERRING DIAG: [phone] (ICD-10-CM) - Status post reverse total replacement of right shoulder   Rationale for Evaluation and Treatment:  Rehabiliation  THERAPY DIAG:  Acute pain of right shoulder  Muscle weakness (generalized)  ONSET DATE: s/p right reverse shoulder arthroplasty on 12/27/2023    SUBJECTIVE:  SUBJECTIVE STATEMENT: No pain upon arrival but still needs help to raise her arm over head .  PERTINENT HISTORY:  See above PMH  PAIN:  NPRS scale: 1/10 upon arrival Pain location:Right shoulder Pain description: achey Aggravating factors: lifting arm Relieving factors: rest   PRECAUTIONS: ,  right shoulder s/p arthroplasty--no lifting >5 lbs and no external rotation past 40 degrees   RED FLAGS: None   WEIGHT BEARING RESTRICTIONS:  No  FALLS:  Has patient fallen in last 6 months? Yes. Number of falls 1, she tripped in parking lot, was scraped up but did not appear to have any significant injury   OCCUPATION:  none  PLOF:  Independent  PATIENT GOALS:  Improve ability to use her right arm and reach up  OBJECTIVE:  Note: Objective  measures were completed at Evaluation unless otherwise noted.   PATIENT SURVEYS:  Patient-Specific Activity Scoring Scheme  0 represents "unable to perform." 10 represents "able to perform at prior level. 0 1 2 3 4 5 6 7 8 9  10 (Date and Score)   Activity Eval     1. Reaching overhead 0     2. Washing hair 3    3. Driving 0   4.    5.    Score 1/10    Total score = sum of the activity scores/number of activities Minimum detectable change (90%CI) for average score = 2 points Minimum detectable change (90%CI) for single activity score = 3 points     EDEMA:  No   Shoulder  PALPATION: Scar is healing well with no sign of infection.   UPPER EXTREMITY ROM:  Active ROM/PROM Right eval Right 02/20/24 eval  Shoulder flexion 60/140 90/  Shoulder extension    Shoulder abduction 30/140 60/  Shoulder adduction    Shoulder extension    Shoulder internal rotation Right SIJ  reaching behind back/WFL L4 behind back  Shoulder external rotation 15 /40 30/  Elbow flexion    Elbow extension    Wrist flexion    Wrist extension    Wrist ulnar deviation    Wrist radial deviation    Wrist pronation    Wrist supination     (Blank rows = not tested)   UPPER EXTREMITY MMT:  MMT Right eval Right 02/20/23  Shoulder flexion 2 3-  Shoulder extension    Shoulder abduction 2 3-  Shoulder adduction    Shoulder extension    Shoulder internal rotation 2 4  Shoulder external rotation 2 3-  Middle trapezius    Lower trapezius    Elbow flexion    Elbow extension    Wrist flexion    Wrist extension    Wrist ulnar deviation    Wrist radial deviation    Wrist pronation    Wrist supination    Grip strength     (Blank rows = not tested)    FUNCTIONAL TESTS:  02/13/24: Grip strength HHD Rt: 35# Lt:20#  TREATMENT DATE:  02/27/24 Thereex UBE L1 8 min  total switch halfway Standing body blade 20 sec X 5, A-P, IR/ER Pendulums X 15 circles, A-P, Abd-add  Theractivity Wall ladder X 10 reps flexion, X 6 reps abduction UE ranger X 10 flexion, X 10 circles CW,CCW Standing rows X 15 Standing shoulder extensions X 15 Standing shoulder flexion red X 15 Standing chest press red X 15 Shoulder IR red X 15 Shoulder ER red X 15 P ball roll up wall for shoulder flexion, 2 X 5 reps Seated shoulder flexion AAROM with wand horizontal X 10 reps, then wand vertical X 10 reps Seated shoulder ER AAROM 5 sec X 10 Seated blaze pods 30 sec X 3 reaching up and out to turn off pods (3 total) with Rt UE Standing blaze pods reach up into bottom shelf of top cabinet 2 pods 30 sec X 1  02/22/24 Thereex UBE L1 7 min total switch halfway Standing body blade 20 sec X 5, A-P, IR/ER  Theractivity Wall ladder X 10 reps flexion, X 5 reps abduction UE ranger X 10 flexion, X 10 circles CW,CCW P ball roll up wall for shoulder flexion, 2 X 5 reps Seated shoulder flexion AAROM with wand horizontal X 5 reps, then wand vertical X 10 reps Seated shoulder abduction AAROM 5 sec X 10 Seated blaze pods 30 sec X 3 reaching up and out to turn off pods (3 total) with Rt UE Seated push/pull strength against PT resistance X 10 each Seated IR/ER strength against PT resistance X 10 each     PATIENT EDUCATION: Education details: HEP, PT plan of care, selfcare Person educated: Patient Education method: Explanation, Demonstration, Verbal cues, and Handouts Education comprehension: verbalized understanding, further education recommended   HOME EXERCISE PROGRAM: Access Code: [email or web address] URL: [email or web address] Date: 02/13/2024 Prepared by: Redell Moose  Exercises - Shoulder Flexion Overhead with Dowel (Mirrored)  - 2 x daily - 6 x weekly - 1 sets - 15 reps - Shoulder Scaption AAROM with Dowel  - 2 x daily - 6 x weekly - 1 sets - 15 reps - Standing Shoulder  Internal Rotation AAROM with Dowel (Mirrored)  - 2 x daily - 6 x weekly - 1 sets - 15 reps - Standing Isometric Shoulder Internal Rotation at Doorway  - 2 x daily - 6 x weekly - 1 sets - 10 reps - 5 hold - Standing Isometric Shoulder External Rotation with Doorway (Mirrored)  - 2 x daily - 6 x weekly - 1 sets - 10 reps - 5 hold - Isometric Shoulder Flexion at Wall  - 2 x daily - 6 x weekly - 1 sets - 10 reps - 5 hold - Isometric Shoulder Extension at Wall  - 2 x daily - 6 x weekly - 1 sets - 10 reps - 5 sec hold - Standing Isometric Shoulder Abduction with Doorway - Arm Bent (Mirrored)  - 2 x daily - 6 x weekly - 1 sets - 10 reps  ASSESSMENT:  CLINICAL IMPRESSION:  I progressed her strength program today and she had good overall tolerance to this. She continues to have weakness as expected and we are working hard to improve this in PT.  OBJECTIVE IMPAIRMENTS: decreased activity tolerance for ADL's,  decreased shoulder mobility, decreased shoulder ROM, decreased strength, impaired flexibility, impaired UE use, and pain.  ACTIVITY LIMITATIONS:, lifting, carry, reaching,, cleaning,driving, and or occupation  PERSONAL FACTORS: DM, ARF also affecting patient's functional outcome.  REHAB POTENTIAL: Good  CLINICAL  DECISION MAKING: Low  EVALUATION COMPLEXITY: Low    GOALS: Short term PT Goals Target date: 03/12/2024   Pt will be I and compliant with HEP. Baseline:  Goal status: MET 02/20/24 Pt will improve right shoulder ROM flexion to at least 90 degrees Baseline: Goal status: MET 02/20/24  Long term PT goals Target date:04/09/2024   Pt will improve shoulder AROM to [facility] to improve functional mobility Baseline: Goal status: ongoing 02/20/24 Pt will improve shoulder strength to at least 4/5 MMT to improve functional strength Baseline: Goal status: ongoing 02/20/24 Pt will improve Patient specific functional scale (PSFS) to at least 6/10 to show improved function level Baseline:1/10 Goal  status: ongoing 02/20/24 Pt will reduce pain to overall less than 3/10 with usual activity and ADL's Baseline: Goal status: ongoing 02/20/24  PLAN: PT FREQUENCY: 2-3 times per week   PT DURATION: 6-8 weeks  PLANNED INTERVENTIONS (unless contraindicated):  97110-Therapeutic exercises, 97530- Therapeutic activity, 97112- Neuromuscular re-education, 97535- Self Care, 02859- Manual therapy, G0283- Electrical stimulation (unattended), 97016- Vasopneumatic device, 97035- Ultrasound, 02966- Ionotophoresis 4mg /ml Dexamethasone , Joint mobilization, and Joint manipulation  PLAN FOR NEXT SESSION:  Blaze pods Monitor for soreness.    Redell JONELLE Moose, PT,DPT 02/27/2024, 11:37 AM

## 2024-02-29 ENCOUNTER — Encounter: Payer: Self-pay | Admitting: Physical Therapy

## 2024-02-29 ENCOUNTER — Ambulatory Visit: Admitting: Physical Therapy

## 2024-02-29 DIAGNOSIS — M6281 Muscle weakness (generalized): Secondary | ICD-10-CM

## 2024-02-29 DIAGNOSIS — M25511 Pain in right shoulder: Secondary | ICD-10-CM

## 2024-02-29 NOTE — Therapy (Signed)
 OUTPATIENT PHYSICAL THERAPY TREATMENT  Patient Name: Meghan [name] MRN: [id number] DOB:06-17-1956, 68 y.o., female Today's Date: 02/29/2024  END OF SESSION:  PT End of Session - 02/29/24 1054     Visit Number 7    Number of Visits 18    Date for PT Re-Evaluation 04/09/24    Progress Note Due on Visit 14    PT Start Time 1048    PT Stop Time 1130    PT Time Calculation (min) 42 min    Activity Tolerance Patient tolerated treatment well    Behavior During Therapy [facility] for tasks assessed/performed           Past Medical History:  Diagnosis Date   Anemia 2020   iron infusions   Anxiety    Arthritis    Cellulitis 06/30/2012   RLE   Depression    Diabetic peripheral neuropathy (HCC)    GERD (gastroesophageal reflux disease)    Headache    High cholesterol    Hypertension    Seasonal allergies    Type II diabetes mellitus (HCC)    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   Past Surgical History:  Procedure Laterality Date   AMPUTATION  ~ 2008   2nd toe of right foot (06/30/2012)   AMPUTATION  07/04/2012   Procedure: AMPUTATION RAY;  Surgeon: Jerona LULLA Sage, MD;  Location: [facility] OR;  Service: Orthopedics;  Laterality: Right;  great toe  amp vs ray amp   AMPUTATION Left 02/13/2021   Procedure: LEFT FOOT 3RD AND 4TH RAY AMPUTATION;  Surgeon: Sage Jerona LULLA, MD;  Location: [facility] OR;  Service: Orthopedics;  Laterality: Left;   BICEPT TENODESIS Right 12/27/2023   Procedure: BICEPS TENODESIS;  Surgeon: Addie Cordella Hamilton, MD;  Location: [facility] OR;  Service: Orthopedics;  Laterality: Right;   CARPAL TUNNEL RELEASE Right    04-25-2023 Dr[facility])   INCISION AND DRAINAGE OF WOUND  03/10/2007   mediastinitis, left medial collarbone (06/30/2012)   LYMPHADENECTOMY  08/09/2005   bilateral groins; had cancer vulva (06/30/2012)   REVERSE SHOULDER ARTHROPLASTY Right 12/27/2023   Procedure: REVERSE SHOULDER ARTHROPLASTY;  Surgeon: Addie Cordella Hamilton, MD;  Location: [facility] OR;   Service: Orthopedics;  Laterality: Right;   TUBAL LIGATION     VULVA SURGERY  08/09/2005   took cancer off (06/30/2012)   WISDOM TOOTH EXTRACTION     Patient Active Problem List   Diagnosis Date Noted   Arthritis of right shoulder 01/21/2024   S/P reverse total shoulder arthroplasty, 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   Osteomyelitis of third toe of left foot (HCC)    History of gout 06/30/2012   DM (diabetes mellitus), type 2, uncontrolled, periph vascular complic 06/30/2012   HTN (hypertension) 06/30/2012   Cellulitis of leg 06/30/2012   ARF (acute renal failure) (HCC) 06/30/2012    PCP: Rosamond Leta NOVAK, MD   REFERRING PROVIDER: Rosamond Leta NOVAK, MD   REFERRING DIAG: [phone] (ICD-10-CM) - Status post reverse total replacement of right shoulder   Rationale for Evaluation and Treatment:  Rehabiliation  THERAPY DIAG:  Acute pain of right shoulder  Muscle weakness (generalized)  ONSET DATE: s/p right reverse shoulder arthroplasty on 12/27/2023    SUBJECTIVE:  SUBJECTIVE STATEMENT: Feels like she may have jerked her arm in the shower, pain and soreness reported today at 6/10 .  PERTINENT HISTORY:  See above PMH  PAIN:  NPRS scale: 6/10 upon arrival Pain location:Right shoulder Pain description: achey Aggravating factors: lifting arm Relieving factors: rest   PRECAUTIONS: ,  right shoulder s/p arthroplasty--no lifting >5 lbs and no external rotation past 40 degrees   RED FLAGS: None   WEIGHT BEARING RESTRICTIONS:  No  FALLS:  Has patient fallen in last 6 months? Yes. Number of falls 1, she tripped in parking lot, was scraped up but did not appear to have any significant injury   OCCUPATION:  none  PLOF:  Independent  PATIENT GOALS:  Improve ability to use her right arm and reach  up  OBJECTIVE:  Note: Objective measures were completed at Evaluation unless otherwise noted.   PATIENT SURVEYS:  Patient-Specific Activity Scoring Scheme  0 represents "unable to perform." 10 represents "able to perform at prior level. 0 1 2 3 4 5 6 7 8 9  10 (Date and Score)   Activity Eval     1. Reaching overhead 0     2. Washing hair 3    3. Driving 0   4.    5.    Score 1/10    Total score = sum of the activity scores/number of activities Minimum detectable change (90%CI) for average score = 2 points Minimum detectable change (90%CI) for single activity score = 3 points     EDEMA:  No   Shoulder  PALPATION: Scar is healing well with no sign of infection.   UPPER EXTREMITY ROM:  Active ROM/PROM Right eval Right 02/20/24 eval  Shoulder flexion 60/140 90/  Shoulder extension    Shoulder abduction 30/140 60/  Shoulder adduction    Shoulder extension    Shoulder internal rotation Right SIJ  reaching behind back/WFL L4 behind back  Shoulder external rotation 15 /40 30/  Elbow flexion    Elbow extension    Wrist flexion    Wrist extension    Wrist ulnar deviation    Wrist radial deviation    Wrist pronation    Wrist supination     (Blank rows = not tested)   UPPER EXTREMITY MMT:  MMT Right eval Right 02/20/23  Shoulder flexion 2 3-  Shoulder extension    Shoulder abduction 2 3-  Shoulder adduction    Shoulder extension    Shoulder internal rotation 2 4  Shoulder external rotation 2 3-  Middle trapezius    Lower trapezius    Elbow flexion    Elbow extension    Wrist flexion    Wrist extension    Wrist ulnar deviation    Wrist radial deviation    Wrist pronation    Wrist supination    Grip strength     (Blank rows = not tested)    FUNCTIONAL TESTS:  02/13/24: Grip strength HHD Rt: 35# Lt:20#  TREATMENT DATE:   02/29/24 Thereex UBE L1 7 min total switch halfway Standing body blade 20 sec X 5, A-P, IR/ER Pendulums X 15 circles, A-P, Abd-add  Theractivity Wall ladder X 5 reps flexion, X 5 reps abduction P ball roll up wall for shoulder flexion, X 5 reps Seated in chair reaching up to place green ball onto 2nd shelf,  X 5 reps Seated shoulder flexion AAROM X 10 reps flexion, 10 reps abduction, 10 reps ER  Modalities Estim X 15 min to left shoulder and upper trap, IFC with intensity turned up to tolerance.   02/27/24 Thereex UBE L1 8 min total switch halfway Standing body blade 20 sec X 5, A-P, IR/ER Pendulums X 15 circles, A-P, Abd-add  Theractivity Wall ladder X 10 reps flexion, X 6 reps abduction UE ranger X 10 flexion, X 10 circles CW,CCW Standing rows X 15 Standing shoulder extensions X 15 Standing shoulder flexion red X 15 Standing chest press red X 15 Shoulder IR red X 15 Shoulder ER red X 15 P ball roll up wall for shoulder flexion, 2 X 5 reps Seated shoulder flexion AAROM with wand horizontal X 10 reps, then wand vertical X 10 reps Seated shoulder ER AAROM 5 sec X 10 Seated blaze pods 30 sec X 3 reaching up and out to turn off pods (3 total) with Rt UE Standing blaze pods reach up into bottom shelf of top cabinet 2 pods 30 sec X 1  02/22/24 Thereex UBE L1 7 min total switch halfway Standing body blade 20 sec X 5, A-P, IR/ER  Theractivity Wall ladder X 10 reps flexion, X 5 reps abduction UE ranger X 10 flexion, X 10 circles CW,CCW P ball roll up wall for shoulder flexion, 2 X 5 reps Seated shoulder flexion AAROM with wand horizontal X 5 reps, then wand vertical X 10 reps Seated shoulder abduction AAROM 5 sec X 10 Seated blaze pods 30 sec X 3 reaching up and out to turn off pods (3 total) with Rt UE Seated push/pull strength against PT resistance X 10 each Seated IR/ER strength against PT resistance X 10 each     PATIENT EDUCATION: Education details: HEP, PT plan of  care, selfcare Person educated: Patient Education method: Explanation, Demonstration, Verbal cues, and Handouts Education comprehension: verbalized understanding, further education recommended   HOME EXERCISE PROGRAM: Access Code: [email or web address] URL: [email or web address] Date: 02/13/2024 Prepared by: Redell Moose  Exercises - Shoulder Flexion Overhead with Dowel (Mirrored)  - 2 x daily - 6 x weekly - 1 sets - 15 reps - Shoulder Scaption AAROM with Dowel  - 2 x daily - 6 x weekly - 1 sets - 15 reps - Standing Shoulder Internal Rotation AAROM with Dowel (Mirrored)  - 2 x daily - 6 x weekly - 1 sets - 15 reps - Standing Isometric Shoulder Internal Rotation at Doorway  - 2 x daily - 6 x weekly - 1 sets - 10 reps - 5 hold - Standing Isometric Shoulder External Rotation with Doorway (Mirrored)  - 2 x daily - 6 x weekly - 1 sets - 10 reps - 5 hold - Isometric Shoulder Flexion at Wall  - 2 x daily - 6 x weekly - 1 sets - 10 reps - 5 hold - Isometric Shoulder Extension at Wall  - 2 x daily - 6 x weekly - 1 sets - 10 reps - 5 sec hold - Standing Isometric Shoulder Abduction with Doorway - Arm Bent (Mirrored)  - 2 x  daily - 6 x weekly - 1 sets - 10 reps  ASSESSMENT:  CLINICAL IMPRESSION:  She was in more pain today so backed down on the intensity of her exercise today and also tried Estim with her to try to reduce pain.   OBJECTIVE IMPAIRMENTS: decreased activity tolerance for ADL's,  decreased shoulder mobility, decreased shoulder ROM, decreased strength, impaired flexibility, impaired UE use, and pain.  ACTIVITY LIMITATIONS:, lifting, carry, reaching,, cleaning,driving, and or occupation  PERSONAL FACTORS: DM, ARF also affecting patient's functional outcome.  REHAB POTENTIAL: Good  CLINICAL DECISION MAKING: Low  EVALUATION COMPLEXITY: Low    GOALS: Short term PT Goals Target date: 03/12/2024   Pt will be I and compliant with HEP. Baseline:  Goal status: MET 02/20/24 Pt  will improve right shoulder ROM flexion to at least 90 degrees Baseline: Goal status: MET 02/20/24  Long term PT goals Target date:04/09/2024   Pt will improve shoulder AROM to [facility] to improve functional mobility Baseline: Goal status: ongoing 02/20/24 Pt will improve shoulder strength to at least 4/5 MMT to improve functional strength Baseline: Goal status: ongoing 02/20/24 Pt will improve Patient specific functional scale (PSFS) to at least 6/10 to show improved function level Baseline:1/10 Goal status: ongoing 02/20/24 Pt will reduce pain to overall less than 3/10 with usual activity and ADL's Baseline: Goal status: ongoing 02/20/24  PLAN: PT FREQUENCY: 2-3 times per week   PT DURATION: 6-8 weeks  PLANNED INTERVENTIONS (unless contraindicated):  97110-Therapeutic exercises, 97530- Therapeutic activity, 97112- Neuromuscular re-education, 97535- Self Care, 02859- Manual therapy, G0283- Electrical stimulation (unattended), 97016- Vasopneumatic device, 97035- Ultrasound, 02966- Ionotophoresis 4mg /ml Dexamethasone , Joint mobilization, and Joint manipulation  PLAN FOR NEXT SESSION:  Blaze pods Monitor for soreness and adjust intensity of workout as able trying to progress strength as tolerated   Redell JONELLE Moose, PT,DPT 02/29/2024, 10:55 AM

## 2024-03-02 ENCOUNTER — Encounter: Payer: Self-pay | Admitting: Physical Therapy

## 2024-03-02 ENCOUNTER — Ambulatory Visit: Admitting: Physical Therapy

## 2024-03-02 DIAGNOSIS — M6281 Muscle weakness (generalized): Secondary | ICD-10-CM

## 2024-03-02 DIAGNOSIS — M25511 Pain in right shoulder: Secondary | ICD-10-CM

## 2024-03-02 NOTE — Therapy (Signed)
 OUTPATIENT PHYSICAL THERAPY TREATMENT  Patient Name: Meghan [name] MRN: [id number] DOB:May 30, 1956, 67 y.o., female Today's Date: 03/02/2024  END OF SESSION:  PT End of Session - 03/02/24 1120     Visit Number 8    Number of Visits 18    Date for PT Re-Evaluation 04/09/24    Progress Note Due on Visit 14    PT Start Time 1030    PT Stop Time 1128    PT Time Calculation (min) 58 min    Activity Tolerance Patient tolerated treatment well    Behavior During Therapy [name] for tasks assessed/performed           Past Medical History:  Diagnosis Date   Anemia 2020   iron infusions   Anxiety    Arthritis    Cellulitis 06/30/2012   RLE   Depression    Diabetic peripheral neuropathy (HCC)    GERD (gastroesophageal reflux disease)    Headache    High cholesterol    Hypertension    Seasonal allergies    Type II diabetes mellitus (HCC)    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   Past Surgical History:  Procedure Laterality Date   AMPUTATION  ~ 2008   2nd toe of right foot (06/30/2012)   AMPUTATION  07/04/2012   Procedure: AMPUTATION RAY;  Surgeon: Meghan [name] Sage, MD;  Location: [facility] OR;  Service: Orthopedics;  Laterality: Right;  great toe  amp vs ray amp   AMPUTATION Left 02/13/2021   Procedure: LEFT FOOT 3RD AND 4TH RAY AMPUTATION;  Surgeon: Meghan Meghan LULLA, MD;  Location: [facility] OR;  Service: Orthopedics;  Laterality: Left;   BICEPT TENODESIS Right 12/27/2023   Procedure: BICEPS TENODESIS;  Surgeon: Meghan Cordella Hamilton, MD;  Location: [facility] OR;  Service: Orthopedics;  Laterality: Right;   CARPAL TUNNEL RELEASE Right    04-25-2023 Meghan [name])   INCISION AND DRAINAGE OF WOUND  03/10/2007   mediastinitis, left medial collarbone (06/30/2012)   LYMPHADENECTOMY  08/09/2005   bilateral groins; had cancer vulva (06/30/2012)   REVERSE SHOULDER ARTHROPLASTY Right 12/27/2023   Procedure: REVERSE SHOULDER ARTHROPLASTY;  Surgeon: Meghan Cordella Hamilton, MD;  Location: [facility] OR;   Service: Orthopedics;  Laterality: Right;   TUBAL LIGATION     VULVA SURGERY  08/09/2005   took cancer off (06/30/2012)   WISDOM TOOTH EXTRACTION     Patient Active Problem List   Diagnosis Date Noted   Arthritis of right shoulder 01/21/2024   S/P reverse total shoulder arthroplasty, 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   Osteomyelitis of third toe of left foot (HCC)    History of gout 06/30/2012   DM (diabetes mellitus), type 2, uncontrolled, periph vascular complic 06/30/2012   HTN (hypertension) 06/30/2012   Cellulitis of leg 06/30/2012   ARF (acute renal failure) (HCC) 06/30/2012    PCP: Meghan Leta NOVAK, MD   REFERRING PROVIDER: Rosamond Leta NOVAK, MD   REFERRING DIAG: [id number] (ICD-10-CM) - Status post reverse total replacement of right shoulder   Rationale for Evaluation and Treatment:  Rehabiliation  THERAPY DIAG:  Acute pain of right shoulder  Muscle weakness (generalized)  ONSET DATE: s/p right reverse shoulder arthroplasty on 12/27/2023    SUBJECTIVE:  SUBJECTIVE STATEMENT: Shoulder is some better since last time but her arm is still sore, she felt like the Estim helped.  Meghan [name]  PERTINENT HISTORY:  See above PMH  PAIN:  NPRS scale: 5/10 upon arrival Pain location:Right shoulder Pain description: achey Aggravating factors: lifting arm Relieving factors: rest   PRECAUTIONS: ,  right shoulder s/p arthroplasty--no lifting >5 lbs and no external rotation past 40 degrees   RED FLAGS: None   WEIGHT BEARING RESTRICTIONS:  No  FALLS:  Has patient fallen in last 6 months? Yes. Number of falls 1, she tripped in parking lot, was scraped up but did not appear to have any significant injury   OCCUPATION:  none  PLOF:  Independent  PATIENT GOALS:  Improve ability to use her right arm and reach  up  OBJECTIVE:  Note: Objective measures were completed at Evaluation unless otherwise noted.   PATIENT SURVEYS:  Patient-Specific Activity Scoring Scheme  0 represents "unable to perform." 10 represents "able to perform at prior level. 0 1 2 3 4 5 6 7 8 9  10 (Date and Score)   Activity Eval     1. Reaching overhead 0     2. Washing hair 3    3. Driving 0   4.    5.    Score 1/10    Total score = sum of the activity scores/number of activities Minimum detectable change (90%CI) for average score = 2 points Minimum detectable change (90%CI) for single activity score = 3 points     EDEMA:  No   Shoulder  PALPATION: Scar is healing well with no sign of infection.   UPPER EXTREMITY ROM:  Active ROM/PROM Right eval Right 02/20/24 eval  Shoulder flexion 60/140 90/  Shoulder extension    Shoulder abduction 30/140 60/  Shoulder adduction    Shoulder extension    Shoulder internal rotation Right SIJ  reaching behind back/WFL L4 behind back  Shoulder external rotation 15 /40 30/  Elbow flexion    Elbow extension    Wrist flexion    Wrist extension    Wrist ulnar deviation    Wrist radial deviation    Wrist pronation    Wrist supination     (Blank rows = not tested)   UPPER EXTREMITY MMT:  MMT Right eval Right 02/20/23  Shoulder flexion 2 3-  Shoulder extension    Shoulder abduction 2 3-  Shoulder adduction    Shoulder extension    Shoulder internal rotation 2 4  Shoulder external rotation 2 3-  Middle trapezius    Lower trapezius    Elbow flexion    Elbow extension    Wrist flexion    Wrist extension    Wrist ulnar deviation    Wrist radial deviation    Wrist pronation    Wrist supination    Grip strength     (Blank rows = not tested)    FUNCTIONAL TESTS:  02/13/24: Grip strength HHD Rt: 35# Lt:20#  TREATMENT DATE:   03/02/24 Thereex UBE L1 8 min total switch halfway Standing body blade 15 sec X 5, A-P, IR/ER Pendulums X 15 circles, A-P, Abd-add  Theractivity Wall ladder X 6 reps flexion, X 6 reps abduction P ball roll up wall for shoulder flexion, X 5 reps Seated in chair reaching up to place green ball onto 2nd shelf,  X 5 reps Pball lift and reach/twist out to side X 10 reps bilat Standing reach into top cabinet bottom shelf X 5 reps then added 1# X 10 more reps  Modalities Estim X 20 min to left shoulder and upper trap, IFC with intensity turned up to tolerance.   02/29/24 Thereex UBE L1 7 min total switch halfway Standing body blade 20 sec X 5, A-P, IR/ER Pendulums X 15 circles, A-P, Abd-add  Theractivity Wall ladder X 5 reps flexion, X 5 reps abduction P ball roll up wall for shoulder flexion, X 5 reps Seated in chair reaching up to place green ball onto 2nd shelf,  X 5 reps Seated shoulder flexion AAROM X 10 reps flexion, 10 reps abduction, 10 reps ER  Modalities Estim X 15 min to left shoulder and upper trap, IFC with intensity turned up to tolerance.   02/27/24 Thereex UBE L1 8 min total switch halfway Standing body blade 20 sec X 5, A-P, IR/ER Pendulums X 15 circles, A-P, Abd-add  Theractivity Wall ladder X 10 reps flexion, X 6 reps abduction UE ranger X 10 flexion, X 10 circles CW,CCW Standing rows X 15 Standing shoulder extensions X 15 Standing shoulder flexion red X 15 Standing chest press red X 15 Shoulder IR red X 15 Shoulder ER red X 15 P ball roll up wall for shoulder flexion, 2 X 5 reps Seated shoulder flexion AAROM with wand horizontal X 10 reps, then wand vertical X 10 reps Seated shoulder ER AAROM 5 sec X 10 Seated blaze pods 30 sec X 3 reaching up and out to turn off pods (3 total) with Rt UE Standing blaze pods reach up into bottom shelf of top cabinet 2 pods 30 sec X 1  02/22/24 Thereex UBE L1 7 min total switch halfway Standing body blade 20 sec X 5,  A-P, IR/ER  Theractivity Wall ladder X 10 reps flexion, X 5 reps abduction UE ranger X 10 flexion, X 10 circles CW,CCW P ball roll up wall for shoulder flexion, 2 X 5 reps Seated shoulder flexion AAROM with wand horizontal X 5 reps, then wand vertical X 10 reps Seated shoulder abduction AAROM 5 sec X 10 Seated blaze pods 30 sec X 3 reaching up and out to turn off pods (3 total) with Rt UE Seated push/pull strength against PT resistance X 10 each Seated IR/ER strength against PT resistance X 10 each     PATIENT EDUCATION: Education details: HEP, PT plan of care, selfcare Person educated: Patient Education method: Explanation, Demonstration, Verbal cues, and Handouts Education comprehension: verbalized understanding, further education recommended   HOME EXERCISE PROGRAM: Access Code: [email or web address] URL: [email or web address] Date: 02/13/2024 Prepared by: Redell Moose  Exercises - Shoulder Flexion Overhead with Dowel (Mirrored)  - 2 x daily - 6 x weekly - 1 sets - 15 reps - Shoulder Scaption AAROM with Dowel  - 2 x daily - 6 x weekly - 1 sets - 15 reps - Standing Shoulder Internal Rotation AAROM with Dowel (Mirrored)  - 2 x daily - 6 x weekly - 1 sets - 15 reps - Standing Isometric Shoulder Internal  Rotation at Doorway  - 2 x daily - 6 x weekly - 1 sets - 10 reps - 5 hold - Standing Isometric Shoulder External Rotation with Doorway (Mirrored)  - 2 x daily - 6 x weekly - 1 sets - 10 reps - 5 hold - Isometric Shoulder Flexion at Wall  - 2 x daily - 6 x weekly - 1 sets - 10 reps - 5 hold - Isometric Shoulder Extension at Wall  - 2 x daily - 6 x weekly - 1 sets - 10 reps - 5 sec hold - Standing Isometric Shoulder Abduction with Doorway - Arm Bent (Mirrored)  - 2 x daily - 6 x weekly - 1 sets - 10 reps  ASSESSMENT:  CLINICAL IMPRESSION:  She was in less pain so progressed strength some focusing on lifting/reaching tasks. Did use Estim again at end to help calm down some pain  and soreness as she feels this helped last time.   OBJECTIVE IMPAIRMENTS: decreased activity tolerance for ADL's,  decreased shoulder mobility, decreased shoulder ROM, decreased strength, impaired flexibility, impaired UE use, and pain.  ACTIVITY LIMITATIONS:, lifting, carry, reaching,, cleaning,driving, and or occupation  PERSONAL FACTORS: DM, ARF also affecting patient's functional outcome.  REHAB POTENTIAL: Good  CLINICAL DECISION MAKING: Low  EVALUATION COMPLEXITY: Low    GOALS: Short term PT Goals Target date: 03/12/2024   Pt will be I and compliant with HEP. Baseline:  Goal status: MET 02/20/24 Pt will improve right shoulder ROM flexion to at least 90 degrees Baseline: Goal status: MET 02/20/24  Long term PT goals Target date:04/09/2024   Pt will improve shoulder AROM to [facility] to improve functional mobility Baseline: Goal status: ongoing 02/20/24 Pt will improve shoulder strength to at least 4/5 MMT to improve functional strength Baseline: Goal status: ongoing 02/20/24 Pt will improve Patient specific functional scale (PSFS) to at least 6/10 to show improved function level Baseline:1/10 Goal status: ongoing 02/20/24 Pt will reduce pain to overall less than 3/10 with usual activity and ADL's Baseline: Goal status: ongoing 02/20/24  PLAN: PT FREQUENCY: 2-3 times per week   PT DURATION: 6-8 weeks  PLANNED INTERVENTIONS (unless contraindicated):  97110-Therapeutic exercises, 97530- Therapeutic activity, 97112- Neuromuscular re-education, 97535- Self Care, 02859- Manual therapy, G0283- Electrical stimulation (unattended), 97016- Vasopneumatic device, 97035- Ultrasound, 02966- Ionotophoresis 4mg /ml Dexamethasone , Joint mobilization, and Joint manipulation  PLAN FOR NEXT SESSION:  Work on reaching up Monitor for soreness and adjust intensity of workout as able trying to progress strength as tolerated   Redell JONELLE Moose, PT,DPT 03/02/2024, 11:26 AM

## 2024-03-05 ENCOUNTER — Ambulatory Visit: Admitting: Physical Therapy

## 2024-03-07 ENCOUNTER — Encounter: Payer: Self-pay | Admitting: Physical Therapy

## 2024-03-07 ENCOUNTER — Ambulatory Visit: Admitting: Physical Therapy

## 2024-03-07 DIAGNOSIS — M6281 Muscle weakness (generalized): Secondary | ICD-10-CM

## 2024-03-07 DIAGNOSIS — M25511 Pain in right shoulder: Secondary | ICD-10-CM

## 2024-03-07 NOTE — Therapy (Signed)
 OUTPATIENT PHYSICAL THERAPY TREATMENT  Patient Name: Meghan [name] MRN: [id number] DOB:Jul 11, 1956, 68 y.o., female Today's Date: 03/07/2024  END OF SESSION:  PT End of Session - 03/07/24 1132     Visit Number 9    Number of Visits 18    Date for PT Re-Evaluation 04/09/24    Progress Note Due on Visit 14    PT Start Time 1045    PT Stop Time 1130    PT Time Calculation (min) 45 min    Activity Tolerance Patient tolerated treatment well    Behavior During Therapy [facility] for tasks assessed/performed           Past Medical History:  Diagnosis Date   Anemia 2020   iron infusions   Anxiety    Arthritis    Cellulitis 06/30/2012   RLE   Depression    Diabetic peripheral neuropathy (HCC)    GERD (gastroesophageal reflux disease)    Headache    High cholesterol    Hypertension    Seasonal allergies    Type II diabetes mellitus (HCC)    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   Past Surgical History:  Procedure Laterality Date   AMPUTATION  ~ 2008   2nd toe of right foot (06/30/2012)   AMPUTATION  07/04/2012   Procedure: AMPUTATION RAY;  Surgeon: Jerona LULLA Sage, MD;  Location: [facility] OR;  Service: Orthopedics;  Laterality: Right;  great toe  amp vs ray amp   AMPUTATION Left 02/13/2021   Procedure: LEFT FOOT 3RD AND 4TH RAY AMPUTATION;  Surgeon: Sage Jerona LULLA, MD;  Location: [facility] OR;  Service: Orthopedics;  Laterality: Left;   BICEPT TENODESIS Right 12/27/2023   Procedure: BICEPS TENODESIS;  Surgeon: Addie Cordella Hamilton, MD;  Location: [facility] OR;  Service: Orthopedics;  Laterality: Right;   CARPAL TUNNEL RELEASE Right    04-25-2023 Dr. Addie [facility])   INCISION AND DRAINAGE OF WOUND  03/10/2007   mediastinitis, left medial collarbone (06/30/2012)   LYMPHADENECTOMY  08/09/2005   bilateral groins; had cancer vulva (06/30/2012)   REVERSE SHOULDER ARTHROPLASTY Right 12/27/2023   Procedure: REVERSE SHOULDER ARTHROPLASTY;  Surgeon: Addie Cordella Hamilton, MD;  Location: [facility] OR;   Service: Orthopedics;  Laterality: Right;   TUBAL LIGATION     VULVA SURGERY  08/09/2005   took cancer off (06/30/2012)   WISDOM TOOTH EXTRACTION     Patient Active Problem List   Diagnosis Date Noted   Arthritis of right shoulder 01/21/2024   S/P reverse total shoulder arthroplasty, 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   Osteomyelitis of third toe of left foot (HCC)    History of gout 06/30/2012   DM (diabetes mellitus), type 2, uncontrolled, periph vascular complic 06/30/2012   HTN (hypertension) 06/30/2012   Cellulitis of leg 06/30/2012   ARF (acute renal failure) (HCC) 06/30/2012    PCP: Rosamond Leta NOVAK, MD   REFERRING PROVIDER: Rosamond Leta NOVAK, MD   REFERRING DIAG: [phone] (ICD-10-CM) - Status post reverse total replacement of right shoulder   Rationale for Evaluation and Treatment:  Rehabiliation  THERAPY DIAG:  Acute pain of right shoulder  Muscle weakness (generalized)  ONSET DATE: s/p right reverse shoulder arthroplasty on 12/27/2023    SUBJECTIVE:  SUBJECTIVE STATEMENT: Shoulder pain is better but still there .  PERTINENT HISTORY:  See above PMH  PAIN:  NPRS scale: 5/10 upon arrival Pain location:Right shoulder Pain description: achey Aggravating factors: lifting arm Relieving factors: rest   PRECAUTIONS: ,  right shoulder s/p arthroplasty--no lifting >5 lbs and no external rotation past 40 degrees   RED FLAGS: None   WEIGHT BEARING RESTRICTIONS:  No  FALLS:  Has patient fallen in last 6 months? Yes. Number of falls 1, she tripped in parking lot, was scraped up but did not appear to have any significant injury   OCCUPATION:  none  PLOF:  Independent  PATIENT GOALS:  Improve ability to use her right arm and reach up  OBJECTIVE:  Note: Objective measures were completed at  Evaluation unless otherwise noted.   PATIENT SURVEYS:  Patient-Specific Activity Scoring Scheme  0 represents "unable to perform." 10 represents "able to perform at prior level. 0 1 2 3 4 5 6 7 8 9  10 (Date and Score)   Activity Eval     1. Reaching overhead 0     2. Washing hair 3    3. Driving 0   4.    5.    Score 1/10    Total score = sum of the activity scores/number of activities Minimum detectable change (90%CI) for average score = 2 points Minimum detectable change (90%CI) for single activity score = 3 points     EDEMA:  No   Shoulder  PALPATION: Scar is healing well with no sign of infection.   UPPER EXTREMITY ROM:  Active ROM/PROM Right eval Right 02/20/24 eval  Shoulder flexion 60/140 90/  Shoulder extension    Shoulder abduction 30/140 60/  Shoulder adduction    Shoulder extension    Shoulder internal rotation Right SIJ  reaching behind back/WFL L4 behind back  Shoulder external rotation 15 /40 30/  Elbow flexion    Elbow extension    Wrist flexion    Wrist extension    Wrist ulnar deviation    Wrist radial deviation    Wrist pronation    Wrist supination     (Blank rows = not tested)   UPPER EXTREMITY MMT:  MMT Right eval Right 02/20/23  Shoulder flexion 2 3-  Shoulder extension    Shoulder abduction 2 3-  Shoulder adduction    Shoulder extension    Shoulder internal rotation 2 4  Shoulder external rotation 2 3-  Middle trapezius    Lower trapezius    Elbow flexion    Elbow extension    Wrist flexion    Wrist extension    Wrist ulnar deviation    Wrist radial deviation    Wrist pronation    Wrist supination    Grip strength     (Blank rows = not tested)    FUNCTIONAL TESTS:  02/13/24: Grip strength HHD Rt: 35# Lt:20#  TREATMENT DATE:  03/07/24 Thereex UBE L1 8 min total switch  halfway Standing body blade 15 sec X 5, A-P, IR/ER Pendulums X 15 circles, A-P, Abd-add Standing isometrics 5 sec hold X 10 each at doorway for flexion, abduction, ER, IR  Theractivity Wall ladder X 6 reps flexion, X 6 reps abduction P ball roll up wall for shoulder flexion, X 8 reps Standing reach into top cabinet bottom shelf X 1# 2 X 5 more reps Standing shoulder flexion 1# X 10 Standing shoulder abduction 1# X10 Standing rows red X 15 Standing shoulder extensions red X 15  03/02/24 Thereex UBE L1 8 min total switch halfway Standing body blade 15 sec X 5, A-P, IR/ER Pendulums X 15 circles, A-P, Abd-add  Theractivity Wall ladder X 6 reps flexion, X 6 reps abduction P ball roll up wall for shoulder flexion, X 5 reps Seated in chair reaching up to place green ball onto 2nd shelf,  X 5 reps Pball lift and reach/twist out to side X 10 reps bilat Standing reach into top cabinet bottom shelf X 5 reps then added 1# X 10 more reps  Modalities Estim X 20 min to left shoulder and upper trap, IFC with intensity turned up to tolerance.     PATIENT EDUCATION: Education details: HEP, PT plan of care, selfcare Person educated: Patient Education method: Explanation, Demonstration, Verbal cues, and Handouts Education comprehension: verbalized understanding, further education recommended   HOME EXERCISE PROGRAM: Access Code: [email or web address] URL: [email or web address] Date: 02/13/2024 Prepared by: Redell Moose  Exercises - Shoulder Flexion Overhead with Dowel (Mirrored)  - 2 x daily - 6 x weekly - 1 sets - 15 reps - Shoulder Scaption AAROM with Dowel  - 2 x daily - 6 x weekly - 1 sets - 15 reps - Standing Shoulder Internal Rotation AAROM with Dowel (Mirrored)  - 2 x daily - 6 x weekly - 1 sets - 15 reps - Standing Isometric Shoulder Internal Rotation at Doorway  - 2 x daily - 6 x weekly - 1 sets - 10 reps - 5 hold - Standing Isometric Shoulder External Rotation with Doorway  (Mirrored)  - 2 x daily - 6 x weekly - 1 sets - 10 reps - 5 hold - Isometric Shoulder Flexion at Wall  - 2 x daily - 6 x weekly - 1 sets - 10 reps - 5 hold - Isometric Shoulder Extension at Wall  - 2 x daily - 6 x weekly - 1 sets - 10 reps - 5 sec hold - Standing Isometric Shoulder Abduction with Doorway - Arm Bent (Mirrored)  - 2 x daily - 6 x weekly - 1 sets - 10 reps  ASSESSMENT:  CLINICAL IMPRESSION:  Progressed strength program today with good overall tolerance noted.   OBJECTIVE IMPAIRMENTS: decreased activity tolerance for ADL's,  decreased shoulder mobility, decreased shoulder ROM, decreased strength, impaired flexibility, impaired UE use, and pain.  ACTIVITY LIMITATIONS:, lifting, carry, reaching,, cleaning,driving, and or occupation  PERSONAL FACTORS: DM, ARF also affecting patient's functional outcome.  REHAB POTENTIAL: Good  CLINICAL DECISION MAKING: Low  EVALUATION COMPLEXITY: Low    GOALS: Short term PT Goals Target date: 03/12/2024   Pt will be I and compliant with HEP. Baseline:  Goal status: MET 02/20/24 Pt will improve right shoulder ROM flexion to at least 90 degrees Baseline: Goal status: MET 02/20/24  Long term PT goals Target date:04/09/2024   Pt will improve shoulder AROM to [facility] to improve functional mobility Baseline: Goal status: ongoing  02/20/24 Pt will improve shoulder strength to at least 4/5 MMT to improve functional strength Baseline: Goal status: ongoing 02/20/24 Pt will improve Patient specific functional scale (PSFS) to at least 6/10 to show improved function level Baseline:1/10 Goal status: ongoing 02/20/24 Pt will reduce pain to overall less than 3/10 with usual activity and ADL's Baseline: Goal status: ongoing 02/20/24  PLAN: PT FREQUENCY: 2-3 times per week   PT DURATION: 6-8 weeks  PLANNED INTERVENTIONS (unless contraindicated):  97110-Therapeutic exercises, 97530- Therapeutic activity, 97112- Neuromuscular re-education, 97535- Self  Care, 02859- Manual therapy, G0283- Electrical stimulation (unattended), 97016- Vasopneumatic device, 97035- Ultrasound, 02966- Ionotophoresis 4mg /ml Dexamethasone , Joint mobilization, and Joint manipulation  PLAN FOR NEXT SESSION:  Work on reaching up and shoulder strength.  Monitor for soreness and adjust intensity of workout as able trying to progress strength as tolerated   Redell JONELLE Moose, PT,DPT 03/07/2024, 11:34 AM

## 2024-03-09 ENCOUNTER — Ambulatory Visit: Attending: Surgical | Admitting: Physical Therapy

## 2024-03-09 ENCOUNTER — Encounter: Payer: Self-pay | Admitting: Physical Therapy

## 2024-03-09 DIAGNOSIS — M6281 Muscle weakness (generalized): Secondary | ICD-10-CM | POA: Diagnosis present

## 2024-03-09 DIAGNOSIS — M25511 Pain in right shoulder: Secondary | ICD-10-CM | POA: Insufficient documentation

## 2024-03-09 NOTE — Therapy (Signed)
 OUTPATIENT PHYSICAL THERAPY TREATMENT  Patient Name: Meghan [name] MRN: [id number] DOB:1956-02-07, 68 y.o., female Today's Date: 03/09/2024  END OF SESSION:  PT End of Session - 03/09/24 1137     Visit Number 10    Number of Visits 18    Date for PT Re-Evaluation 04/09/24    Progress Note Due on Visit 14    PT Start Time 1045    PT Stop Time 1130    PT Time Calculation (min) 45 min    Activity Tolerance Patient tolerated treatment well    Behavior During Therapy [facility] for tasks assessed/performed           Past Medical History:  Diagnosis Date   Anemia 2020   iron infusions   Anxiety    Arthritis    Cellulitis 06/30/2012   RLE   Depression    Diabetic peripheral neuropathy (HCC)    GERD (gastroesophageal reflux disease)    Headache    High cholesterol    Hypertension    Seasonal allergies    Type II diabetes mellitus (HCC)    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   Past Surgical History:  Procedure Laterality Date   AMPUTATION  ~ 2008   2nd toe of right foot (06/30/2012)   AMPUTATION  07/04/2012   Procedure: AMPUTATION RAY;  Surgeon: Jerona LULLA Sage, MD;  Location: [facility] OR;  Service: Orthopedics;  Laterality: Right;  great toe  amp vs ray amp   AMPUTATION Left 02/13/2021   Procedure: LEFT FOOT 3RD AND 4TH RAY AMPUTATION;  Surgeon: Sage Jerona LULLA, MD;  Location: [facility] OR;  Service: Orthopedics;  Laterality: Left;   BICEPT TENODESIS Right 12/27/2023   Procedure: BICEPS TENODESIS;  Surgeon: Addie Cordella Hamilton, MD;  Location: [facility] OR;  Service: Orthopedics;  Laterality: Right;   CARPAL TUNNEL RELEASE Right    04-25-2023 Dr. Addie [facility])   INCISION AND DRAINAGE OF WOUND  03/10/2007   mediastinitis, left medial collarbone (06/30/2012)   LYMPHADENECTOMY  08/09/2005   bilateral groins; had cancer vulva (06/30/2012)   REVERSE SHOULDER ARTHROPLASTY Right 12/27/2023   Procedure: REVERSE SHOULDER ARTHROPLASTY;  Surgeon: Addie Cordella Hamilton, MD;  Location[facility] OR;   Service: Orthopedics;  Laterality: Right;   TUBAL LIGATION     VULVA SURGERY  08/09/2005   took cancer off (06/30/2012)   WISDOM TOOTH EXTRACTION     Patient Active Problem List   Diagnosis Date Noted   Arthritis of right shoulder 01/21/2024   S/P reverse total shoulder arthroplasty, 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   Osteomyelitis of third toe of left foot (HCC)    History of gout 06/30/2012   DM (diabetes mellitus), type 2, uncontrolled, periph vascular complic 06/30/2012   HTN (hypertension) 06/30/2012   Cellulitis of leg 06/30/2012   ARF (acute renal failure) (HCC) 06/30/2012    PCP: Rosamond Leta NOVAK, MD   REFERRING PROVIDER: Rosamond Leta NOVAK, MD   REFERRING DIAG: [phone] (ICD-10-CM) - Status post reverse total replacement of right shoulder   Rationale for Evaluation and Treatment:  Rehabiliation  THERAPY DIAG:  Acute pain of right shoulder  Muscle weakness (generalized)  ONSET DATE: s/p right reverse shoulder arthroplasty on 12/27/2023    SUBJECTIVE:  SUBJECTIVE STATEMENT: Shoulder pain is not bad, she feels tired .  PERTINENT HISTORY:  See above PMH  PAIN:  NPRS scale: 5/10 upon arrival Pain location:Right shoulder Pain description: achey Aggravating factors: lifting arm Relieving factors: rest   PRECAUTIONS: ,  right shoulder s/p arthroplasty--no lifting >5 lbs and no external rotation past 40 degrees   RED FLAGS: None   WEIGHT BEARING RESTRICTIONS:  No  FALLS:  Has patient fallen in last 6 months? Yes. Number of falls 1, she tripped in parking lot, was scraped up but did not appear to have any significant injury   OCCUPATION:  none  PLOF:  Independent  PATIENT GOALS:  Improve ability to use her right arm and reach up  OBJECTIVE:  Note: Objective measures were completed at  Evaluation unless otherwise noted.   PATIENT SURVEYS:  Patient-Specific Activity Scoring Scheme  0 represents "unable to perform." 10 represents "able to perform at prior level. 0 1 2 3 4 5 6 7 8 9  10 (Date and Score)   Activity Eval     1. Reaching overhead 0     2. Washing hair 3    3. Driving 0   4.    5.    Score 1/10    Total score = sum of the activity scores/number of activities Minimum detectable change (90%CI) for average score = 2 points Minimum detectable change (90%CI) for single activity score = 3 points     EDEMA:  No   Shoulder  PALPATION: Scar is healing well with no sign of infection.   UPPER EXTREMITY ROM:  Active ROM/PROM Right eval Right 02/20/24 eval  Shoulder flexion 60/140 90/  Shoulder extension    Shoulder abduction 30/140 60/  Shoulder adduction    Shoulder extension    Shoulder internal rotation Right SIJ  reaching behind back/WFL L4 behind back  Shoulder external rotation 15 /40 30/  Elbow flexion    Elbow extension    Wrist flexion    Wrist extension    Wrist ulnar deviation    Wrist radial deviation    Wrist pronation    Wrist supination     (Blank rows = not tested)   UPPER EXTREMITY MMT:  MMT Right eval Right 02/20/23  Shoulder flexion 2 3-  Shoulder extension    Shoulder abduction 2 3-  Shoulder adduction    Shoulder extension    Shoulder internal rotation 2 4  Shoulder external rotation 2 3-  Middle trapezius    Lower trapezius    Elbow flexion    Elbow extension    Wrist flexion    Wrist extension    Wrist ulnar deviation    Wrist radial deviation    Wrist pronation    Wrist supination    Grip strength     (Blank rows = not tested)    FUNCTIONAL TESTS:  02/13/24: Grip strength HHD Rt: 35# Lt:20#  TREATMENT DATE:  03/09/24 Thereex UBE L1 8 min total switch halfway Standing  body blade 15 sec X 5, A-P, IR/ER Pendulums X 15 circles, A-P, Abd-add Standing isometrics 5 sec hold X 10 each at doorway for flexion, abduction, ER, IR  Theractivity Wall ladder X 6 reps flexion, X 6 reps abduction P ball roll up wall for shoulder flexion, X 8 reps Standing reach into top cabinet bottom shelf X 1# 2 X 5 more reps, then performed with both arms using 3# 2X5 reps Standing rows red X 15 Standing shoulder extensions red X 15 Seated blaze pods on high table 3 pods, 30 sec X 3 for reaching and strength.  03/07/24 Thereex UBE L1 8 min total switch halfway Standing body blade 15 sec X 5, A-P, IR/ER Pendulums X 15 circles, A-P, Abd-add Standing isometrics 5 sec hold X 10 each at doorway for flexion, abduction, ER, IR  Theractivity Wall ladder X 6 reps flexion, X 6 reps abduction P ball roll up wall for shoulder flexion, X 8 reps Standing reach into top cabinet bottom shelf X 1# 2 X 5 more reps Standing shoulder flexion 1# X 10 Standing shoulder abduction 1# X10 Standing rows red X 15 Standing shoulder extensions red X 15      PATIENT EDUCATION: Education details: HEP, PT plan of care, selfcare Person educated: Patient Education method: Explanation, Demonstration, Verbal cues, and Handouts Education comprehension: verbalized understanding, further education recommended   HOME EXERCISE PROGRAM: Access Code: [email or web address] URL: [email or web address] Date: 02/13/2024 Prepared by: Redell Moose  Exercises - Shoulder Flexion Overhead with Dowel (Mirrored)  - 2 x daily - 6 x weekly - 1 sets - 15 reps - Shoulder Scaption AAROM with Dowel  - 2 x daily - 6 x weekly - 1 sets - 15 reps - Standing Shoulder Internal Rotation AAROM with Dowel (Mirrored)  - 2 x daily - 6 x weekly - 1 sets - 15 reps - Standing Isometric Shoulder Internal Rotation at Doorway  - 2 x daily - 6 x weekly - 1 sets - 10 reps - 5 hold - Standing Isometric Shoulder External Rotation with  Doorway (Mirrored)  - 2 x daily - 6 x weekly - 1 sets - 10 reps - 5 hold - Isometric Shoulder Flexion at Wall  - 2 x daily - 6 x weekly - 1 sets - 10 reps - 5 hold - Isometric Shoulder Extension at Wall  - 2 x daily - 6 x weekly - 1 sets - 10 reps - 5 sec hold - Standing Isometric Shoulder Abduction with Doorway - Arm Bent (Mirrored)  - 2 x daily - 6 x weekly - 1 sets - 10 reps  ASSESSMENT:  CLINICAL IMPRESSION:  PT continues to focus on improving her shoulder strength and functional reaching. She remains limited with this but is slowly improving and we will work to progress as tolerated. She shows good effort with PT. .  OBJECTIVE IMPAIRMENTS: decreased activity tolerance for ADL's,  decreased shoulder mobility, decreased shoulder ROM, decreased strength, impaired flexibility, impaired UE use, and pain.  ACTIVITY LIMITATIONS:, lifting, carry, reaching,, cleaning,driving, and or occupation  PERSONAL FACTORS: DM, ARF also affecting patient's functional outcome.  REHAB POTENTIAL: Good  CLINICAL DECISION MAKING: Low  EVALUATION COMPLEXITY: Low    GOALS: Short term PT Goals Target date: 03/12/2024   Pt will be I and compliant with HEP. Baseline:  Goal status: MET 02/20/24 Pt will improve right shoulder ROM flexion to at least 90 degrees  Baseline: Goal status: MET 02/20/24  Long term PT goals Target date:04/09/2024   Pt will improve shoulder AROM to [facility] to improve functional mobility Baseline: Goal status: ongoing 02/20/24 Pt will improve shoulder strength to at least 4/5 MMT to improve functional strength Baseline: Goal status: ongoing 02/20/24 Pt will improve Patient specific functional scale (PSFS) to at least 6/10 to show improved function level Baseline:1/10 Goal status: ongoing 02/20/24 Pt will reduce pain to overall less than 3/10 with usual activity and ADL's Baseline: Goal status: ongoing 02/20/24  PLAN: PT FREQUENCY: 2-3 times per week   PT DURATION: 6-8  weeks  PLANNED INTERVENTIONS (unless contraindicated):  97110-Therapeutic exercises, 97530- Therapeutic activity, 97112- Neuromuscular re-education, 97535- Self Care, 02859- Manual therapy, G0283- Electrical stimulation (unattended), 97016- Vasopneumatic device, 97035- Ultrasound, 02966- Ionotophoresis 4mg /ml Dexamethasone , Joint mobilization, and Joint manipulation  PLAN FOR NEXT SESSION:  Work on reaching up and shoulder strength.  Monitor for soreness and adjust intensity of workout as able trying to progress strength as tolerated   Redell JONELLE Moose, PT,DPT 03/09/2024, 11:38 AM

## 2024-03-19 ENCOUNTER — Ambulatory Visit: Admitting: Physical Therapy

## 2024-04-25 ENCOUNTER — Encounter: Payer: Self-pay | Admitting: Surgical

## 2024-04-25 ENCOUNTER — Ambulatory Visit: Admitting: Surgical

## 2024-04-25 ENCOUNTER — Ambulatory Visit (INDEPENDENT_AMBULATORY_CARE_PROVIDER_SITE_OTHER): Admitting: Surgical

## 2024-04-25 DIAGNOSIS — M542 Cervicalgia: Secondary | ICD-10-CM

## 2024-04-25 NOTE — Progress Notes (Signed)
 Post-Op Visit Note   Patient: Meghan [name]           Date of Birth: [birth year]           MRN: [id number] Visit Date: 04/25/2024 PCP: Rosamond Leta NOVAK, MD   Assessment & Plan:  Chief Complaint:  Chief Complaint  Patient presents with   right shoulder follow up    12/27/2023 Right RSA   Visit Diagnoses:  1. Cervicalgia     Plan: Patient is a 68 year old female who presents s/p right reverse shoulder arthroplasty on 12/27/2023.  She is about 4 months out from surgery.  Overall she is having no pain in her shoulder but still has difficulty with shoulder function in terms of her active motion.  She has active forward flexion to about 40 degrees.  Having no numbness or tingling in the arm aside from some residual tingling in her hand from carpal tunnel syndrome following carpal tunnel release.  She does have some neck pain and discomfort but denies radicular pain down the arm.  On exam, incision is well-healed.  Patient has intact EPL, FPL, finger abduction, pronation, tricep extension, deltoid function with strength rated 5/5 and comparable with contralateral side.  She does have weakness noted with grip strength, supination, bicep flexion with strength rated 4/5 relative to 5/5 strength in the left arm.  She has a small amount of deltoid atrophy compared with contralateral side.  Axillary nerve seems to be intact with deltoid firing fairly comparable to the contralateral deltoid.  Patient has negative Spurling sign.  Negative Lhermitte sign.  Plan at this time is order MRI of the cervical spine for further evaluation of cervical pathology that could contribute to continued arm weakness following reverse shoulder arthroplasty.  She has definitive weakness of bicep flexion, supination, grip strength testing on exam today.  She does have prior radiographs of her cervical spine from last year that demonstrate severe degenerative changes primarily at C5-6 with near complete loss of disc space  and osteophyte formation.  Plan to call her with MRI results and consider referral to Dr. Georgina if there is anything potentially treatable.  Follow-Up Instructions: No follow-ups on file.   Orders:  Orders Placed This Encounter  Procedures   MR Cervical Spine Wo Contrast   No orders of the defined types were placed in this encounter.   Imaging: No results found.  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   Osteomyelitis of third toe of left foot (HCC)    History of gout 06/30/2012   DM (diabetes mellitus), type 2, uncontrolled, periph vascular complic 06/30/2012   HTN (hypertension) 06/30/2012   Cellulitis of leg 06/30/2012   ARF (acute renal failure) (HCC) 06/30/2012   Past Medical History:  Diagnosis Date   Anemia 2020   iron infusions   Anxiety    Arthritis    Cellulitis 06/30/2012   RLE   Depression    Diabetic peripheral neuropathy (HCC)    GERD (gastroesophageal reflux disease)    Headache    High cholesterol    Hypertension    Seasonal allergies    Type II diabetes mellitus (HCC)    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    Family History  Adopted: Yes  Problem Relation Age of Onset   Dementia Mother    Heart attack Father     Past Surgical History:  Procedure  Laterality Date   AMPUTATION  ~ 2008   2nd toe of right foot (06/30/2012)   AMPUTATION  07/04/2012   Procedure: AMPUTATION RAY;  Surgeon: Jerona LULLA Sage, MD;  Location: [facility] OR;  Service: Orthopedics;  Laterality: Right;  great toe  amp vs ray amp   AMPUTATION Left 02/13/2021   Procedure: LEFT FOOT 3RD AND 4TH RAY AMPUTATION;  Surgeon: Sage Jerona LULLA, MD;  Location: [facility] OR;  Service: Orthopedics;  Laterality: Left;   BICEPT TENODESIS Right 12/27/2023   Procedure: BICEPS TENODESIS;  Surgeon: Addie Cordella Hamilton, MD;  Location: [facility] OR;  Service: Orthopedics;  Laterality: Right;    CARPAL TUNNEL RELEASE Right    04-25-2023 Dr. Addie [facility])   INCISION AND DRAINAGE OF WOUND  03/10/2007   mediastinitis, left medial collarbone (06/30/2012)   LYMPHADENECTOMY  08/09/2005   bilateral groins; had cancer vulva (06/30/2012)   REVERSE SHOULDER ARTHROPLASTY Right 12/27/2023   Procedure: REVERSE SHOULDER ARTHROPLASTY;  Surgeon: Addie Cordella Hamilton, MD;  Location: [facility] OR;  Service: Orthopedics;  Laterality: Right;   TUBAL LIGATION     VULVA SURGERY  08/09/2005   took cancer off (06/30/2012)   WISDOM TOOTH EXTRACTION     Social History   Occupational History   Not on file  Tobacco Use   Smoking status: Former    Current packs/day: 0.00    Types: Cigarettes    Quit date: 11/1998    Years since quitting: 25.4   Smokeless tobacco: Never   Tobacco comments:    06/30/2012 quit cigarette smoking in 1999  Vaping Use   Vaping status: Never Used  Substance and Sexual Activity   Alcohol use: Not Currently   Drug use: Yes    Frequency: 3.0 times per week    Types: Marijuana   Sexual activity: Not Currently    Birth control/protection: Post-menopausal

## 2024-05-01 ENCOUNTER — Ambulatory Visit (HOSPITAL_COMMUNITY)
Admission: RE | Admit: 2024-05-01 | Discharge: 2024-05-01 | Disposition: A | Discharge status: Home / Self Care | Source: Ambulatory Visit | Attending: Surgical | Admitting: Surgical

## 2024-05-01 DIAGNOSIS — M542 Cervicalgia: Secondary | ICD-10-CM | POA: Diagnosis present

## 2024-05-02 ENCOUNTER — Ambulatory Visit: Payer: Self-pay | Admitting: Surgical

## 2024-05-02 NOTE — Progress Notes (Signed)
 Appointment scheduled.

## 2024-05-02 NOTE — Progress Notes (Signed)
 Meghan [name], I called Meghan [name] and said that she needs urgent appointment to see Dr. Georgina for her persistent and worsening right arm weakness.  Looks like she needs surgery based on MRI scan to me.

## 2024-05-09 ENCOUNTER — Other Ambulatory Visit (INDEPENDENT_AMBULATORY_CARE_PROVIDER_SITE_OTHER): Payer: Self-pay

## 2024-05-09 ENCOUNTER — Ambulatory Visit: Admitting: Orthopedic Surgery

## 2024-05-09 VITALS — BP 128/68 | HR 84 | Ht 67.0 in | Wt 187.0 lb

## 2024-05-09 DIAGNOSIS — M542 Cervicalgia: Secondary | ICD-10-CM

## 2024-05-09 NOTE — Progress Notes (Signed)
 Orthopedic Spine Surgery Office Note  Assessment: Patient is a 68 y.o. female with cervical myelopathy and chronic deltoid and bicep weakness.  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  I discussed the natural history of myelopathy which typically involves stepwise decline in function over time.  I explained that there is no conservative treatment that has been shown to help with cervical myelopathy.  Accordingly, I discussed surgery as an option for her.  I specifically discussed a C3-6 ACDF.  I told her that this would likely not help with her right arm weakness since this has been present for years, but the goal of the surgery would be to prevent further weakness in her arm and to prevent progression of her myelopathic symptoms.  After our conversation, patient wanted to think about it more and wanted to let me know how she would like to proceed   Patient expressed understanding of the plan and all questions were answered to the patient's satisfaction.   ___________________________________________________________________________   History:  Patient is a 68 y.o. female who presents today for cervical spine.  Patient has had several years of right upper extremity weakness.  She does not recall any trauma or injury that preceded the onset of this weakness.  She also has had the feeling that she is off balance particularly on unlevel surfaces.  She has not needed any ambulatory aids.  She is also had difficulty with fine motor skills in her hands.  She states she has great difficulty with buttoning shirts.  She has decreased sensation in her bilateral hands.  She does have a history of right carpal tunnel release but that did not help with the decrease sensation in her hands.  She has not noticed the symptoms getting worse recently.  She was referred to me due to her persistent right arm weakness.  Denies any  radiating arm pain.  Has some mild neck pain.   Weakness: Yes, right arm feels weaker particular in the shoulder and the biceps.  No other weakness noted  Difficulty with fine motor skills (e.g., buttoning shirts, handwriting): Yes, has great difficulty with buttoning shirts.  Wears exclusively shirts without buttons now.  She also has difficulty with holding a phone in a coffee cup particular in that right arm.  She does report dropping objects frequently Symptoms of imbalance: Yes, feels unsteady on her feet particularly on unlevel surfaces.  Reports being able to navigate steps without a handrail Paresthesias and numbness: Yes, has decreased sensation in her bilateral hands with paresthesias.  Also, has paresthesias and numbness in bilateral feet from diabetic neuropathy which is chronic Bowel or bladder incontinence: Denies Saddle anesthesia: Denies  Treatments tried: PT, Lyrica   Review of systems: Denies fevers and chills, night sweats, unexplained weight loss, pain that wakes her at night.  Has a history of vulvar cancer  Past medical history: HLD HTN Vulvar cancer Depression/anxiety GERD DM (last A1c was 6.2 on 09/23/2023) Neuropathy  Allergies: NKDA  Past surgical history:  Toe amputations Foot ray amputations Right carpal tunnel release Biceps tenodesis Resection of vulva cancer Right reverse TSA Lymphadenectomy I&D of wound  Social history: Denies use of nicotine product (smoking, vaping, patches, smokeless) Alcohol use: denies Reports marijuana use, denies other recreational drug use   Physical Exam:  General: no acute distress, appears stated age Neurologic: alert, answering questions appropriately, following commands Respiratory: unlabored breathing on room air, symmetric chest rise Psychiatric:  appropriate affect, normal cadence to speech   MSK (spine):  -Strength exam      Left  Right Grip strength                5/5  5/5 Interosseus   5/5    5/5 Wrist extension  5/5  5/5 Wrist flexion   5/5  5/5 Elbow flexion   5/5  4-/5 Deltoid    5/5  3/5  EHL    5/5  5/5 TA    5/5  5/5 GSC    5/5  5/5 Knee extension  5/5  5/5 Hip flexion   5/5  5/5  -Sensory exam    Sensation intact to light touch in L3-S1 nerve distributions of bilateral lower extremities  Sensation intact to light touch in C5-T1 nerve distributions of bilateral upper extremities  -Brachioradialis DTR: 2/4 on the left, 2/4 on the right -Biceps DTR: 2/4 on the left, 1/4 on the right -Achilles DTR: 2/4 on the left, 2/4 on the right -Patellar tendon DTR: 3/4 on the left, 3/4 on the right  -Spurling: negative bilaterally -Hoffman sign: negative bilaterally -Clonus: no beats bilaterally -Interosseous wasting: none seen -Grip and release test: negative -Romberg: positive -Gait: wide-based   Imaging: XRs of the cervical spine from 05/09/2024 was independently reviewed and interpreted, showing disc height loss with anterior osteophyte formation at C5/6.  No evidence of instability on flexion/extension views.  No fracture or dislocation seen.  MRI of the cervical spine from 05/01/2024 was independently reviewed and interpreted, showing disc herniation with central stenosis and subtle T2 cord signal change seen.  Mild right sided foraminal stenosis at C3/4.  Central stenosis and right sided foraminal stenosis at C4/5.  Central stenosis and bilateral foraminal stenosis at C5/6.  No other significant stenosis seen.   Patient name: Meghan [name] Patient MRN: [id number] Date of visit: 05/09/24

## 2024-06-11 ENCOUNTER — Encounter: Payer: Self-pay | Admitting: Radiology
